# Patient Record
Sex: Female | Born: 1940 | Race: White | Hispanic: No | Smoking: Never smoker
Health system: Southern US, Community
[De-identification: ages and names within clinical notes are randomized; demographics above are authoritative.]

## PROBLEM LIST (undated history)

## (undated) DIAGNOSIS — C4491 Basal cell carcinoma of skin, unspecified: Secondary | ICD-10-CM

## (undated) DIAGNOSIS — K5790 Diverticulosis of intestine, part unspecified, without perforation or abscess without bleeding: Secondary | ICD-10-CM

## (undated) DIAGNOSIS — N3281 Overactive bladder: Secondary | ICD-10-CM

## (undated) DIAGNOSIS — R51 Headache: Secondary | ICD-10-CM

## (undated) DIAGNOSIS — I10 Essential (primary) hypertension: Secondary | ICD-10-CM

## (undated) DIAGNOSIS — A048 Other specified bacterial intestinal infections: Secondary | ICD-10-CM

## (undated) DIAGNOSIS — K589 Irritable bowel syndrome without diarrhea: Secondary | ICD-10-CM

## (undated) DIAGNOSIS — M1990 Unspecified osteoarthritis, unspecified site: Secondary | ICD-10-CM

## (undated) DIAGNOSIS — E785 Hyperlipidemia, unspecified: Secondary | ICD-10-CM

## (undated) DIAGNOSIS — F5101 Primary insomnia: Secondary | ICD-10-CM

## (undated) DIAGNOSIS — R519 Headache, unspecified: Secondary | ICD-10-CM

## (undated) DIAGNOSIS — Z87898 Personal history of other specified conditions: Secondary | ICD-10-CM

## (undated) DIAGNOSIS — K5900 Constipation, unspecified: Secondary | ICD-10-CM

## (undated) DIAGNOSIS — Z8719 Personal history of other diseases of the digestive system: Secondary | ICD-10-CM

## (undated) DIAGNOSIS — M810 Age-related osteoporosis without current pathological fracture: Secondary | ICD-10-CM

## (undated) DIAGNOSIS — C801 Malignant (primary) neoplasm, unspecified: Secondary | ICD-10-CM

## (undated) DIAGNOSIS — H409 Unspecified glaucoma: Secondary | ICD-10-CM

## (undated) DIAGNOSIS — J309 Allergic rhinitis, unspecified: Secondary | ICD-10-CM

## (undated) HISTORY — PX: BLADDER SUSPENSION: SHX5069

## (undated) HISTORY — DX: Malignant (primary) neoplasm, unspecified: C80.1

## (undated) HISTORY — DX: Diverticulosis of intestine, part unspecified, without perforation or abscess without bleeding: K57.90

## (undated) HISTORY — DX: Unspecified osteoarthritis, unspecified site: M19.90

## (undated) HISTORY — DX: Hyperlipidemia, unspecified: E78.5

## (undated) HISTORY — DX: Personal history of other specified conditions: Z87.898

## (undated) HISTORY — DX: Constipation, unspecified: K59.00

## (undated) HISTORY — DX: Personal history of other diseases of the digestive system: Z87.19

## (undated) HISTORY — DX: Primary insomnia: F51.01

## (undated) HISTORY — DX: Unspecified glaucoma: H40.9

## (undated) HISTORY — PX: SURGICAL HX OTHER: 99

## (undated) HISTORY — DX: Age-related osteoporosis without current pathological fracture: M81.0

## (undated) HISTORY — DX: Allergic rhinitis, unspecified: J30.9

## (undated) HISTORY — PX: PR COLONOSCOPY STOMA DX INCLUDING COLLJ SPEC SPX: 44388

## (undated) HISTORY — DX: Essential (primary) hypertension: I10

## (undated) HISTORY — DX: Headache, unspecified: R51.9

## (undated) HISTORY — DX: Other specified bacterial intestinal infections: A04.8

## (undated) HISTORY — DX: Irritable bowel syndrome, unspecified: K58.9

## (undated) HISTORY — DX: Headache: R51

## (undated) HISTORY — DX: Overactive bladder: N32.81

## (undated) HISTORY — DX: Basal cell carcinoma of skin, unspecified: C44.91

## (undated) HISTORY — PX: BLADDER SUSPENSION: SHX72

## (undated) HISTORY — PX: COLONOSCOPY: SHX174

## (undated) MED ORDER — LOSARTAN POTASSIUM 100 MG OR TABS
ORAL_TABLET | ORAL | Status: AC
Start: 2018-12-28 — End: ?

## (undated) MED ORDER — FLUOCINOLONE ACETONIDE 0.01 % OT OIL
TOPICAL_OIL | OTIC | 2 refills | Status: AC
Start: 2022-09-26 — End: ?

## (undated) MED ORDER — FLUOCINOLONE ACETONIDE 0.01 % OT OIL
TOPICAL_OIL | OTIC | Status: AC
Start: 2018-06-05 — End: ?

## (undated) DEATH — deceased

---

## 1995-04-05 HISTORY — PX: BREAST BIOPSY: SHX20

## 1996-04-04 HISTORY — PX: PR TOTAL ABDOMINAL HYSTERECT W/WO RMVL TUBE OVARY: 58150

## 1996-04-04 HISTORY — PX: ABDOMINAL HYSTERECTOMY: SHX81

## 1998-10-26 ENCOUNTER — Ambulatory Visit (HOSPITAL_COMMUNITY): Admission: RE | Admit: 1998-10-26 | Discharge: 1998-10-26 | Payer: Self-pay | Admitting: Internal Medicine

## 1998-10-26 ENCOUNTER — Encounter: Payer: Self-pay | Admitting: Internal Medicine

## 1998-10-28 ENCOUNTER — Other Ambulatory Visit: Admission: RE | Admit: 1998-10-28 | Discharge: 1998-10-28 | Payer: Self-pay | Admitting: *Deleted

## 1999-08-27 ENCOUNTER — Encounter: Payer: Self-pay | Admitting: Internal Medicine

## 1999-08-27 ENCOUNTER — Encounter: Admission: RE | Admit: 1999-08-27 | Discharge: 1999-08-27 | Payer: Self-pay | Admitting: Internal Medicine

## 1999-09-08 ENCOUNTER — Ambulatory Visit (HOSPITAL_COMMUNITY): Admission: RE | Admit: 1999-09-08 | Discharge: 1999-09-08 | Payer: Self-pay | Admitting: Internal Medicine

## 1999-09-08 ENCOUNTER — Encounter: Payer: Self-pay | Admitting: Internal Medicine

## 1999-09-09 ENCOUNTER — Ambulatory Visit (HOSPITAL_COMMUNITY): Admission: RE | Admit: 1999-09-09 | Discharge: 1999-09-09 | Payer: Self-pay | Admitting: Internal Medicine

## 1999-09-09 ENCOUNTER — Encounter: Payer: Self-pay | Admitting: Internal Medicine

## 2000-03-06 ENCOUNTER — Ambulatory Visit (HOSPITAL_COMMUNITY): Admission: RE | Admit: 2000-03-06 | Discharge: 2000-03-06 | Payer: Self-pay | Admitting: Internal Medicine

## 2000-03-06 ENCOUNTER — Encounter: Payer: Self-pay | Admitting: Internal Medicine

## 2000-03-08 ENCOUNTER — Encounter: Admission: RE | Admit: 2000-03-08 | Discharge: 2000-03-08 | Payer: Self-pay | Admitting: Internal Medicine

## 2000-03-08 ENCOUNTER — Encounter: Payer: Self-pay | Admitting: Internal Medicine

## 2001-04-05 ENCOUNTER — Encounter: Payer: Self-pay | Admitting: Internal Medicine

## 2001-04-05 ENCOUNTER — Ambulatory Visit (HOSPITAL_COMMUNITY): Admission: RE | Admit: 2001-04-05 | Discharge: 2001-04-05 | Payer: Self-pay | Admitting: Internal Medicine

## 2002-02-12 ENCOUNTER — Encounter: Payer: Self-pay | Admitting: Internal Medicine

## 2002-02-12 ENCOUNTER — Encounter: Admission: RE | Admit: 2002-02-12 | Discharge: 2002-02-12 | Payer: Self-pay | Admitting: Internal Medicine

## 2002-04-19 ENCOUNTER — Encounter: Admission: RE | Admit: 2002-04-19 | Discharge: 2002-04-19 | Payer: Self-pay | Admitting: Internal Medicine

## 2002-04-19 ENCOUNTER — Encounter: Payer: Self-pay | Admitting: Internal Medicine

## 2002-05-07 ENCOUNTER — Ambulatory Visit (HOSPITAL_COMMUNITY): Admission: RE | Admit: 2002-05-07 | Discharge: 2002-05-07 | Payer: Self-pay | Admitting: Internal Medicine

## 2002-05-07 ENCOUNTER — Encounter: Payer: Self-pay | Admitting: Internal Medicine

## 2002-06-27 ENCOUNTER — Encounter: Payer: Self-pay | Admitting: Internal Medicine

## 2002-06-27 ENCOUNTER — Encounter: Admission: RE | Admit: 2002-06-27 | Discharge: 2002-06-27 | Payer: Self-pay | Admitting: Internal Medicine

## 2002-10-29 ENCOUNTER — Ambulatory Visit (HOSPITAL_COMMUNITY): Admission: RE | Admit: 2002-10-29 | Discharge: 2002-10-29 | Payer: Self-pay | Admitting: Gastroenterology

## 2003-04-25 ENCOUNTER — Encounter: Admission: RE | Admit: 2003-04-25 | Discharge: 2003-04-25 | Payer: Self-pay | Admitting: Internal Medicine

## 2003-05-30 ENCOUNTER — Encounter: Admission: RE | Admit: 2003-05-30 | Discharge: 2003-05-30 | Payer: Self-pay | Admitting: Gastroenterology

## 2003-06-23 ENCOUNTER — Ambulatory Visit (HOSPITAL_COMMUNITY): Admission: RE | Admit: 2003-06-23 | Discharge: 2003-06-23 | Payer: Self-pay | Admitting: Internal Medicine

## 2004-07-16 ENCOUNTER — Ambulatory Visit (HOSPITAL_COMMUNITY): Admission: RE | Admit: 2004-07-16 | Discharge: 2004-07-16 | Payer: Self-pay | Admitting: Internal Medicine

## 2005-04-04 LAB — HM COLONOSCOPY: HM Colonoscopy: NORMAL

## 2005-08-04 ENCOUNTER — Ambulatory Visit (HOSPITAL_COMMUNITY): Admission: RE | Admit: 2005-08-04 | Discharge: 2005-08-04 | Payer: Self-pay | Admitting: Internal Medicine

## 2005-11-14 ENCOUNTER — Other Ambulatory Visit: Admission: RE | Admit: 2005-11-14 | Discharge: 2005-11-14 | Payer: Self-pay | Admitting: Internal Medicine

## 2005-12-13 ENCOUNTER — Encounter: Admission: RE | Admit: 2005-12-13 | Discharge: 2005-12-13 | Payer: Self-pay | Admitting: Internal Medicine

## 2006-02-28 ENCOUNTER — Encounter: Admission: RE | Admit: 2006-02-28 | Discharge: 2006-02-28 | Payer: Self-pay | Admitting: Family Medicine

## 2006-08-11 ENCOUNTER — Ambulatory Visit (HOSPITAL_COMMUNITY): Admission: RE | Admit: 2006-08-11 | Discharge: 2006-08-11 | Payer: Self-pay | Admitting: *Deleted

## 2007-04-09 ENCOUNTER — Ambulatory Visit: Payer: Self-pay | Admitting: Internal Medicine

## 2007-04-09 DIAGNOSIS — R519 Headache, unspecified: Secondary | ICD-10-CM | POA: Insufficient documentation

## 2007-04-09 DIAGNOSIS — K589 Irritable bowel syndrome without diarrhea: Secondary | ICD-10-CM | POA: Insufficient documentation

## 2007-04-09 DIAGNOSIS — R51 Headache: Secondary | ICD-10-CM | POA: Insufficient documentation

## 2007-06-11 ENCOUNTER — Telehealth: Payer: Self-pay | Admitting: Internal Medicine

## 2007-06-12 ENCOUNTER — Ambulatory Visit: Payer: Self-pay | Admitting: Internal Medicine

## 2007-06-12 LAB — CONVERTED CEMR LAB
ALT: 18 units/L (ref 0–35)
AST: 29 units/L (ref 0–37)
Albumin: 4.1 g/dL (ref 3.5–5.2)
Alkaline Phosphatase: 60 units/L (ref 39–117)
Basophils Absolute: 0 10*3/uL (ref 0.0–0.1)
Basophils Relative: 0.9 % (ref 0.0–1.0)
Bilirubin, Direct: 0.2 mg/dL (ref 0.0–0.3)
Blood in Urine, dipstick: NEGATIVE
Cholesterol: 209 mg/dL (ref 0–200)
Eosinophils Relative: 2.8 % (ref 0.0–5.0)
Glucose, Urine, Semiquant: NEGATIVE
Hemoglobin: 14.5 g/dL (ref 12.0–15.0)
Ketones, urine, test strip: NEGATIVE
Lymphocytes Relative: 35.1 % (ref 12.0–46.0)
MCV: 87 fL (ref 78.0–100.0)
Monocytes Absolute: 0.3 10*3/uL (ref 0.2–0.7)
Neutrophils Relative %: 53.8 % (ref 43.0–77.0)
Nitrite: NEGATIVE
RBC: 5.03 M/uL (ref 3.87–5.11)
RDW: 12.2 % (ref 11.5–14.6)
Sodium: 142 meq/L (ref 135–145)
TSH: 0.81 microintl units/mL (ref 0.35–5.50)
Total Protein: 6.4 g/dL (ref 6.0–8.3)
Triglycerides: 73 mg/dL (ref 0–149)
Urobilinogen, UA: 0.2
pH: 6

## 2007-06-15 ENCOUNTER — Ambulatory Visit: Payer: Self-pay | Admitting: Internal Medicine

## 2007-07-24 ENCOUNTER — Telehealth: Payer: Self-pay | Admitting: Internal Medicine

## 2007-07-26 ENCOUNTER — Telehealth: Payer: Self-pay | Admitting: Internal Medicine

## 2007-08-02 ENCOUNTER — Encounter: Payer: Self-pay | Admitting: Internal Medicine

## 2007-08-22 ENCOUNTER — Ambulatory Visit (HOSPITAL_COMMUNITY): Admission: RE | Admit: 2007-08-22 | Discharge: 2007-08-22 | Payer: Self-pay | Admitting: Internal Medicine

## 2007-09-18 ENCOUNTER — Encounter: Payer: Self-pay | Admitting: Internal Medicine

## 2007-09-21 ENCOUNTER — Encounter: Payer: Self-pay | Admitting: Internal Medicine

## 2008-06-19 ENCOUNTER — Encounter: Payer: Self-pay | Admitting: Internal Medicine

## 2008-06-19 LAB — CONVERTED CEMR LAB
Albumin: 4.3 g/dL
BUN: 20 mg/dL
Calcium: 9.2 mg/dL
Cholesterol: 259 mg/dL
Eosinophils Relative: 2 %
Glucose, Bld: 80 mg/dL
HCT: 41.8 %
HDL: 58 mg/dL
LDL Cholesterol: 184 mg/dL
MCV: 85 fL
Neutrophils Relative %: 53 %
Platelets: 203 10*3/uL
Potassium: 4.1 meq/L
RBC: 4.9 M/uL
RDW: 13.4 %
Total Protein: 6.9 g/dL

## 2008-09-09 ENCOUNTER — Encounter: Payer: Self-pay | Admitting: Internal Medicine

## 2008-09-11 ENCOUNTER — Encounter: Payer: Self-pay | Admitting: Internal Medicine

## 2008-11-14 ENCOUNTER — Encounter: Payer: Self-pay | Admitting: Internal Medicine

## 2008-11-14 LAB — CONVERTED CEMR LAB
ALT: 18 units/L
AST: 28 units/L
Alkaline Phosphatase: 88 units/L
HDL: 53 mg/dL
LDL Cholesterol: 85 mg/dL
TSH: 1.23 microintl units/mL

## 2009-07-10 ENCOUNTER — Encounter: Payer: Self-pay | Admitting: Internal Medicine

## 2009-08-05 ENCOUNTER — Encounter: Payer: Self-pay | Admitting: Internal Medicine

## 2009-11-02 ENCOUNTER — Encounter: Payer: Self-pay | Admitting: Internal Medicine

## 2009-11-02 LAB — CONVERTED CEMR LAB: TSH: 0.96 microintl units/mL

## 2009-11-12 ENCOUNTER — Encounter: Admission: RE | Admit: 2009-11-12 | Discharge: 2009-11-12 | Payer: Self-pay | Admitting: *Deleted

## 2009-11-12 ENCOUNTER — Encounter: Payer: Self-pay | Admitting: Internal Medicine

## 2009-11-12 LAB — HM MAMMOGRAPHY

## 2009-11-30 ENCOUNTER — Encounter: Admission: RE | Admit: 2009-11-30 | Discharge: 2009-11-30 | Payer: Self-pay | Admitting: Family Medicine

## 2010-01-21 ENCOUNTER — Ambulatory Visit: Payer: Self-pay | Admitting: Internal Medicine

## 2010-01-21 ENCOUNTER — Encounter (INDEPENDENT_AMBULATORY_CARE_PROVIDER_SITE_OTHER): Payer: Self-pay | Admitting: *Deleted

## 2010-01-21 ENCOUNTER — Telehealth: Payer: Self-pay | Admitting: Internal Medicine

## 2010-01-21 DIAGNOSIS — H8110 Benign paroxysmal vertigo, unspecified ear: Secondary | ICD-10-CM | POA: Insufficient documentation

## 2010-01-21 DIAGNOSIS — M129 Arthropathy, unspecified: Secondary | ICD-10-CM | POA: Insufficient documentation

## 2010-01-21 DIAGNOSIS — I10 Essential (primary) hypertension: Secondary | ICD-10-CM | POA: Insufficient documentation

## 2010-01-21 DIAGNOSIS — R198 Other specified symptoms and signs involving the digestive system and abdomen: Secondary | ICD-10-CM | POA: Insufficient documentation

## 2010-01-21 DIAGNOSIS — E785 Hyperlipidemia, unspecified: Secondary | ICD-10-CM | POA: Insufficient documentation

## 2010-01-21 DIAGNOSIS — Z9189 Other specified personal risk factors, not elsewhere classified: Secondary | ICD-10-CM | POA: Insufficient documentation

## 2010-01-21 LAB — CONVERTED CEMR LAB
ALT: 20 units/L (ref 0–35)
AST: 27 units/L (ref 0–37)
Albumin: 4 g/dL (ref 3.5–5.2)
Alkaline Phosphatase: 85 units/L (ref 39–117)
Basophils Relative: 0.8 % (ref 0.0–3.0)
Bilirubin, Direct: 0 mg/dL (ref 0.0–0.3)
Eosinophils Absolute: 0.1 10*3/uL (ref 0.0–0.7)
Eosinophils Relative: 1.8 % (ref 0.0–5.0)
HCT: 41.3 % (ref 36.0–46.0)
Hemoglobin: 14.4 g/dL (ref 12.0–15.0)
MCV: 86.4 fL (ref 78.0–100.0)
Monocytes Absolute: 0.3 10*3/uL (ref 0.1–1.0)
Neutro Abs: 2.2 10*3/uL (ref 1.4–7.7)
Neutrophils Relative %: 54.7 % (ref 43.0–77.0)
RBC: 4.79 M/uL (ref 3.87–5.11)
Total Bilirubin: 0.6 mg/dL (ref 0.3–1.2)
WBC: 4 10*3/uL — ABNORMAL LOW (ref 4.5–10.5)

## 2010-01-22 ENCOUNTER — Encounter: Payer: Self-pay | Admitting: Internal Medicine

## 2010-01-26 ENCOUNTER — Encounter: Admission: RE | Admit: 2010-01-26 | Discharge: 2010-01-26 | Payer: Self-pay | Admitting: Internal Medicine

## 2010-01-27 ENCOUNTER — Encounter: Payer: Self-pay | Admitting: Internal Medicine

## 2010-02-01 ENCOUNTER — Telehealth: Payer: Self-pay | Admitting: Internal Medicine

## 2010-02-01 ENCOUNTER — Encounter: Payer: Self-pay | Admitting: Internal Medicine

## 2010-02-01 ENCOUNTER — Encounter: Admission: RE | Admit: 2010-02-01 | Discharge: 2010-02-15 | Payer: Self-pay | Admitting: Internal Medicine

## 2010-02-02 ENCOUNTER — Telehealth (INDEPENDENT_AMBULATORY_CARE_PROVIDER_SITE_OTHER): Payer: Self-pay | Admitting: *Deleted

## 2010-02-03 ENCOUNTER — Encounter: Payer: Self-pay | Admitting: Internal Medicine

## 2010-02-03 DIAGNOSIS — E559 Vitamin D deficiency, unspecified: Secondary | ICD-10-CM | POA: Insufficient documentation

## 2010-02-09 ENCOUNTER — Ambulatory Visit: Payer: Self-pay | Admitting: Internal Medicine

## 2010-02-09 LAB — CONVERTED CEMR LAB
Direct LDL: 168.9 mg/dL
HDL: 50 mg/dL (ref >39.00)
Total CHOL/HDL Ratio: 5
Triglycerides: 133 mg/dL (ref 0.0–149.0)
VLDL: 26.6 mg/dL (ref 0.0–40.0)

## 2010-02-10 ENCOUNTER — Encounter: Payer: Self-pay | Admitting: Internal Medicine

## 2010-02-19 ENCOUNTER — Ambulatory Visit: Payer: Self-pay | Admitting: Internal Medicine

## 2010-04-02 ENCOUNTER — Encounter: Payer: Self-pay | Admitting: Internal Medicine

## 2010-04-25 ENCOUNTER — Encounter: Payer: Self-pay | Admitting: Internal Medicine

## 2010-05-02 LAB — CONVERTED CEMR LAB
ALT: 24 U/L
AST: 33 U/L
Albumin: 4.4 g/dL
Alkaline Phosphatase: 95 U/L
BUN: 18 mg/dL
Basophils Relative: 1 %
CO2: 26 meq/L
Calcium: 9.1 mg/dL
Chloride: 104 meq/L
Cholesterol: 171 mg/dL
Creatinine, Ser: 0.65 mg/dL
Eosinophils Relative: 3 %
Glucose, Bld: 82 mg/dL
HCT: 42.6 %
HDL: 60 mg/dL
Hemoglobin: 14.7 g/dL
LDL Cholesterol: 96 mg/dL
Lymphocytes, automated: 43 %
MCV: 88 fL
Monocytes Relative: 8 %
Neutrophils Relative %: 45 %
Platelets: 240 10*3/uL
Potassium: 4.1 meq/L
RBC: 4.87 M/uL
RDW: 14 %
Sodium: 141 meq/L
Total Bilirubin: 0.5 mg/dL
Total Protein: 6.5 g/dL
Triglyceride fasting, serum: 77 mg/dL
WBC: 4.9 10*3/uL

## 2010-05-04 NOTE — Miscellaneous (Signed)
Summary: Patient Discharged / [facility]   Patient Discharged / [facility]   Imported By: Lennie Odor 02/22/2010 14:22:07  _____________________________________________________________________  External Attachment:    Type:   Image     Comment:   External Document

## 2010-05-04 NOTE — Progress Notes (Signed)
Summary: REFERRAL  Phone Note Call from Patient Call back at [phone]   Summary of Call: Patient is requesting referral to PT for vestibular rehab. I see this was already ordered, left vm for patient that MD has ordered requested referral.  Initial call taken by: Lamar Sprinkles, CMA,  January 21, 2010 5:43 PM

## 2010-05-04 NOTE — Progress Notes (Signed)
Summary: Lab req  Phone Note Call from Patient Call back at Home Phone [phone]   Caller: Patient Summary of Call: Pt called requesting to have Lipids done prior to OV 11/18. Okay to order 272.4? Initial call taken by: Margaret Pyle, CMA,  February 01, 2010 2:10 PM  Follow-up for Phone Call        ok, yes - Follow-up by: Newt Lukes MD,  February 02, 2010 8:11 AM  Additional Follow-up for Phone Call Additional follow up Details #1::        Labs entered into IDX, pt notified. Additional Follow-up by: Margaret Pyle, CMA,  February 02, 2010 8:45 AM

## 2010-05-04 NOTE — Assessment & Plan Note (Signed)
Summary: NEW MEDICARE PT--PKG--#-FORMER DR K PT--[facility]C   Vital Signs:  Patient profile:   70 year old female Height:      61 inches (154.94 cm) Weight:      122.8 pounds (55.82 kg) BMI:     23.29 O2 Sat:      97 % on Room air Temp:     97.9 degrees F (36.61 degrees C) oral Pulse rate:   72 / minute BP sitting:   132 / 68  (left arm) Cuff size:   regular  Vitals Entered By: Orlan Leavens RMA (January 21, 2010 9:33 AM)  O2 Flow:  Room air CC: New patient Is Patient Diabetic? No Pain Assessment Patient in pain? no      Comments want flu shot. Will need letter stating she had flu inj   Primary Care Provider:  Newt Lukes MD  CC:  New patient.  History of Present Illness: new pt to me but known to our practice - seen 2009 by by partner Dr. Kirtland Bouchard here to est care  c/o BPPV -dizzy symptoms - onset 6 months ago while living in [place] - dx by provider there with same -  symptoms progressive in past few weeks symptoms present for last few years to lesser extent symptoms unimproved with self manuevers wishes to not take oral meds if able to avoid no HA, no hearing change or ringing - no vision change feels "sloshing" in left ear but no discharge or pain also a/w nausea but no falls  IBS, constipation predominate - long hx same - worse since retirement 10/2009 - not improved with attendtion to diet and use of probiotics episode flares 50% time, prev only 2-3x/week a/w bloating and nausea but no vomitting -  +lower abd discomfort but not painful overwhelming fatigue during flare  glaucoma - reports compliance with ongoing medical treatment and no changes in medication dose or frequency. denies adverse side effects related to current therapy. no eye pain or vision changes - follows with optho  dyslipidemia -prev on rx med tx for same - lipitor - self dc'd same 10/2009 due to muscle fatigue which has improved off med - - follows low fat diet and regular exercise  OA - follows with  ortho at [facility] for same  Preventive Screening-Counseling & Management  Alcohol-Tobacco     Alcohol drinks/day: <1     Alcohol Counseling: not indicated; use of alcohol is not excessive or problematic     Smoking Status: never     Tobacco Counseling: not indicated; no tobacco use  Caffeine-Diet-Exercise     Diet Counseling: not indicated; diet is assessed to be healthy     Does Patient Exercise: yes     Exercise Counseling: not indicated; exercise is adequate     Depression Counseling: not indicated; screening negative for depression  Safety-Violence-Falls     Seat Belt Counseling: not indicated; patient wears seat belts     Helmet Counseling: not indicated; patient wears helmet when riding bicycle/motocycle     Firearm Counseling: not applicable     Violence Counseling: not indicated; no violence risk noted     Fall Risk Counseling: not indicated; no significant falls noted  Clinical Review Panels:  Prevention   Last Mammogram:  [facility].   No specific mammographic evidence of malignancy.  Assessment: BIRADS 1. (11/12/2009)   Last Pap Smear:  Interpretation /Result:Negative for intraepithelial Lesion or Malignancy.    (04/04/2006)   Last Colonoscopy:  Pt  states had colonoscopy done back in 08-26-1996, not sure exactly where, but everything was normal (04/04/1996)  Immunizations   Last Flu Vaccine:  Fluvax 3+ (01/21/2010)  Lipid Management   Cholesterol:  209 (06/12/2007)   LDL (bad choesterol):  DEL (06/12/2007)   HDL (good cholesterol):  48.3 (06/12/2007)  CBC   WBC:  4.0 (06/12/2007)   RBC:  5.03 (06/12/2007)   Hgb:  14.5 (06/12/2007)   Hct:  43.8 (06/12/2007)   Platelets:  232 (06/12/2007)   MCV  87.0 (06/12/2007)   MCHC  33.0 (06/12/2007)   RDW  12.2 (06/12/2007)   PMN:  53.8 (06/12/2007)   Lymphs:  35.1 (06/12/2007)   Monos:  7.4 (06/12/2007)   Eosinophils:  2.8 (06/12/2007)   Basophil:  0.9 (06/12/2007)  Complete Metabolic  Panel   Glucose:  84 (06/12/2007)   Sodium:  142 (06/12/2007)   Potassium:  3.9 (06/12/2007)   Chloride:  105 (06/12/2007)   CO2:  31 (06/12/2007)   BUN:  16 (06/12/2007)   Creatinine:  0.7 (06/12/2007)   Albumin:  4.1 (06/12/2007)   Total Protein:  6.4 (06/12/2007)   Calcium:  9.3 (06/12/2007)   Total Bili:  0.9 (06/12/2007)   Alk Phos:  60 (06/12/2007)   SGPT (ALT):  18 (06/12/2007)   SGOT (AST):  29 (06/12/2007)   Current Medications (verified): 1)  Latanoprost 0.005 % Soln (Latanoprost) .... Use 1 Drop 3 Times A Week  Allergies (verified): No Known Drug Allergies  Past History:  Past Medical History: glaucoma constipation prone IBS history H. pylori treated 08-27-2006 Headache Hyperlipidemia Hypertension Urinary incontinence arthritis  MD roster: Darlen Round - macdiarmind ortho - rebecca bassett [facility])  Past Surgical History: bladder suspension Aug 26, 1984, 27-Aug-1995 Hysterectomy 1996-08-26 breast biopsy in 27-Aug-1995 colonscopy Aug 26, 2005  Family History: father died late 39s, senile dementia mother died age 53, throat cancer   One brother died at 67, probable cardiac death one sister.  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 I have reviewed all other systems and they were negative.   Physical Exam  General:  alert, well-developed, well-nourished, and cooperative to examination.    Head:  Normocephalic and atraumatic without obvious abnormalities. No apparent alopecia or balding. Eyes:  vision grossly intact; pupils equal, round and reactive to light.  conjunctiva and lids normal.   no nystagmus Ears:  normal pinnae bilaterally, without erythema, swelling, or  tenderness to palpation. TMs clear, without effusion, or cerumen impaction. Hearing grossly normal bilaterally  Mouth:  teeth and gums in good repair; mucous membranes moist, without lesions or ulcers. oropharynx clear without exudate, erythema.  Neck:  supple, full ROM, no masses, no thyromegaly; no thyroid nodules or tenderness. no JVD or carotid bruits.   Lungs:  normal respiratory effort, no intercostal retractions or use of accessory muscles; normal breath sounds bilaterally - no crackles and no wheezes.    Heart:  normal rate, regular rhythm, no murmur, and no rub. BLE without edema. normal DP pulses and normal cap refill in all 4 extremities    Abdomen:  soft, non-tender, normal bowel sounds, no distention; no masses and no appreciable hepatomegaly or splenomegaly.   Neurologic:  alert & oriented X3 and cranial nerves II-XII symetrically intact.  strength normal in all extremities, sensation intact to light touch, and gait normal. speech fluent without dysarthria or aphasia; follows commands with good comprehension.  Psych:  Oriented X3, memory intact for recent and remote, normally interactive, good eye contact, not anxious appearing, not depressed appearing, and not agitated.      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)  has not improved with self tx HallPike - wishes to avoid meds for tx same neuro exam benign today refer for formal vestibular rehab and consider need for ENT eval if not improved -  Orders: Physical Therapy Referral (PT)  Problem # 2:  NAUSEA (ICD-787.02) progressive, near chronic symptoms -  exam and history benign - no weight loss, fever or vomitting ?a/w constipation IBS or other -  check LFTs and CBC now + celiac ab - review other recent labs at [place] and [place] providers as able - ROI signed also refer for abd [place] to eval anatomy and r/o GB dz Orders: T-Sprue Panel (Celiac Disease Aby Eval)  (83516x3/[address]) TLB-CBC Platelet - w/Differential (85025-CBCD) TLB-Hepatic/Liver Function Pnl (80076-HEPATIC) Radiology Referral (Radiology)  Problem # 3:  IBS (ICD-564.1) constipation predominate - eval as above for nausea - labs, [place] review prior med records from other providers including TSH, colo  trial probiotic x 4 weeks and use gas x for bloating - consider amitiza but pt wishes to avoid meds if possible Orders: T-Sprue Panel (Celiac Disease Aby Eval) (83516x3/[address]) TLB-CBC Platelet - w/Differential (85025-CBCD) TLB-Hepatic/Liver Function Pnl (80076-HEPATIC) Radiology Referral (Radiology)  Problem # 4:  HYPERLIPIDEMIA (ICD-272.4)  prev on lipitor - stopped 10/2009 due to muscle fatigue - symptoms improved off statin recheck next OV and consider alt tx trial as needed   Labs Reviewed: SGOT: 29 (06/12/2007)   SGPT: 18 (06/12/2007)   HDL:48.3 (06/12/2007)  LDL:DEL (06/12/2007)  Chol:209 (06/12/2007)  Trig:73 (06/12/2007)  Time spent with patient 45 minutes, more than 50% of this time was spent counseling patient on BPPV options for tx, eval and plans for IBS and nausea/constipation + review of chronic med conditions and prior tx  Complete Medication List: 1)  Latanoprost 0.005 % Soln (Latanoprost) .... Use 1 drop 3 times a week 2)  Align Caps (Probiotic product) .Marland Kitchen.. 1 by mouth once daily  Other Orders: Flu Vaccine 62yrs + MEDICARE PATIENTS (W1191) Administration Flu vaccine - MCR (Y7829)  Patient Instructions: 1)  it was good to see you today. 2)  test(s) ordered today - your results will be posted on the phone tree for review in 48-72 hours from the time of test completion; call [phone] and enter your 9 digit MRN (listed above on this page, just below your name); if any changes need to be made or there are abnormal results, you will be contacted directly. 3)  we'll make referral for abdominal ultrasound. Our office will contact you regarding this appointment  once made.  4)  try Align once daily for IBS symptoms - samples given - also simethicone (gas x) for bloating symptoms  5)  plan refer to vestibular rehab - let us know if [address] can do this - if not, will refer to [address] 6)  will send for records from your providers at [facility] and d.c. to review 7)  Please schedule a follow-up appointment in 6 weeks to continue review, call sooner if problems.    Orders Added: 1)  Flu Vaccine 106yrs +  MEDICARE PATIENTS [Q2039] 2)  Administration Flu vaccine - MCR [G0008] 3)  T-Sprue Panel (Celiac Disease Aby Eval) [83516x3/[address]] 4)  TLB-CBC Platelet - w/Differential [85025-CBCD] 5)  TLB-Hepatic/Liver Function Pnl [80076-HEPATIC] 6)  Radiology Referral [Radiology] 7)  Est. Patient Level V [09811] 8)  Physical Therapy Referral [PT]     Pap Smear  Procedure date:  04/04/2006  Findings:      Interpretation /Result:Negative for intraepithelial Lesion or Malignancy.     Mammogram  Procedure date:  11/12/2009  Findings:      Location: [facility].   No specific mammographic evidence of malignancy.  Assessment: BIRADS 1.   Comments:      Screening mammogram in 1 year.     Bone Density  Procedure date:  12/13/2005  Findings:      Location:  [facility].    Lumbar spine (L1-L4) BMD, 0.749 T-score, -2.7 Z-score, -0.2  Left hip (Neck) BMD,  0.659 T-score, -1.7 Z-score,  -0.2  Colonoscopy  Procedure date:  04/04/1996  Findings:      Pt states had colonoscopy done back in 1998, not sure exactly where, but everything was normal Flu Vaccine Consent Questions     Do you have a history of severe allergic reactions to this vaccine? no    Any prior history of allergic reactions to egg and/or gelatin? no    Do you have a sensitivity to the preservative Thimersol? no    Do you have a past history of Guillan-Barre Syndrome? no    Do you currently have an acute febrile illness? no     Have you ever had a severe reaction to latex? no    Vaccine information given and explained to patient? yes    Are you currently pregnant? no    Lot Number:[id number]   Exp Date:10/02/2010   Site Given  Left Deltoid IM T-score, -1.7 Z-score,  -0.2  Colonoscopy  Procedure date:  04/04/1996  Findings:      Pt states had colonoscopy done back in 1998, not sure exactly where, but everything was normal  .lbmedflu

## 2010-05-04 NOTE — Assessment & Plan Note (Signed)
Summary: PER PT FU  D/T---[facility]   Vital Signs:  Patient profile:   70 year old female Height:      61 inches (154.94 cm) Weight:      123.4 pounds (56.09 kg) O2 Sat:      97 % on Room air Temp:     98.0 degrees F (36.67 degrees C) oral Pulse rate:   63 / minute BP sitting:   134 / 60  (left arm) Cuff size:   regular  Vitals Entered By: Orlan Leavens RMA (February 19, 2010 8:35 AM)  O2 Flow:  Room air CC: follow-up visit Is Patient Diabetic? No Pain Assessment Patient in pain? no        Primary Care Sola Margolis:  Newt Lukes MD  CC:  follow-up visit.  History of Present Illness: new pt to me but known to our practice - seen 2009 by by partner Dr. Kirtland Bouchard here to est care  c/o BPPV -dizzy symptoms - onset summer 2011 while living in [place] - dx by Viyan Rosamond there with same -  symptoms progressive but present for last few years to lesser extent symptoms unimproved with self manuevers no HA, no hearing change or ringing - no vision change feels "sloshing" in left ear but no discharge or pain also a/w nausea but no falls s/p OP vestib rehab eval -   IBS, constipation predominate - long hx same - worse since retirement 10/2009 - not improved with attendtion to diet and use of probiotics episode flares 50% time, prev only 2-3x/week a/w bloating and nausea but no vomitting -  +lower abd discomfort but not painful overwhelming fatigue during flare improved with probiotic and gas x  glaucoma - reports compliance with ongoing medical treatment and no changes in medication dose or frequency. denies adverse side effects related to current therapy. no eye pain or vision changes - follows with optho  dyslipidemia -prev on rx med tx for same - lipitor - self dc'd same 10/2009 due to muscle fatigue which has improved off med - - follows low fat diet and regular exercise  OA - follows with ortho at [facility] for same  Preventive Screening-Counseling & Management  Alcohol-Tobacco     Alcohol  drinks/day: <1     Alcohol Counseling: not indicated; use of alcohol is not excessive or problematic     Smoking Status: never     Tobacco Counseling: not indicated; no tobacco use  Clinical Review Panels:  Prevention   Last Mammogram:  [facility].   No specific mammographic evidence of malignancy.  Assessment: BIRADS 1. (11/12/2009)   Last Pap Smear:  Interpretation /Result:Negative for intraepithelial Lesion or Malignancy.    (04/04/2006)   Last Colonoscopy:  Pt states had colonoscopy done back in 1998, not sure exactly where, but everything was normal (04/04/1996)  CBC   WBC:  4.0 (01/21/2010)   RBC:  4.79 (01/21/2010)   Hgb:  14.4 (01/21/2010)   Hct:  41.3 (01/21/2010)   Platelets:  237.0 (01/21/2010)   MCV  86.4 (01/21/2010)   MCHC  34.8 (01/21/2010)   RDW  13.0 (01/21/2010)   PMN:  54.7 (01/21/2010)   Lymphs:  35.1 (01/21/2010)   Monos:  7.6 (01/21/2010)   Eosinophils:  1.8 (01/21/2010)   Basophil:  0.8 (01/21/2010)  Complete Metabolic Panel   Glucose:  82 (08/05/2009)   Sodium:  141 (08/05/2009)   Potassium:  4.1 (08/05/2009)   Chloride:  104 (08/05/2009)   CO2:  26 (08/05/2009)  BUN:  18 (08/05/2009)   Creatinine:  0.65 (08/05/2009)   Albumin:  4.0 (01/21/2010)   Total Protein:  6.3 (01/21/2010)   Calcium:  9.1 (08/05/2009)   Total Bili:  0.6 (01/21/2010)   Alk Phos:  85 (01/21/2010)   SGPT (ALT):  20 (01/21/2010)   SGOT (AST):  27 (01/21/2010)   Current Medications (verified): 1)  Latanoprost 0.005 % Soln (Latanoprost) .... Use 1 Drop 3 Times A Week 2)  Align  Caps (Probiotic Product) .Marland Kitchen.. 1 By Mouth Once Daily  Allergies (verified): No Known Drug Allergies  Past History:  Past Medical History: glaucoma constipation prone IBS history H. pylori treated 2008 Headache Hyperlipidemia Hypertension Urinary incontinence/OAB arthritis  MD roster: uro - macdiarmind  ortho - rebecca bassett [facility])  Review of  Systems  The patient denies fever, chest pain, and syncope.    Physical Exam  General:  alert, well-developed, well-nourished, and cooperative to examination.    Eyes:  vision grossly intact; pupils equal, round and reactive to light.  conjunctiva and lids normal.   no nystagmus Lungs:  normal respiratory effort, no intercostal retractions or use of accessory muscles; normal breath sounds bilaterally - no crackles and no wheezes.    Heart:  normal rate, regular rhythm, no murmur, and no rub. BLE without edema.   Impression & Recommendations:  Problem # 1:  HYPERLIPIDEMIA (ICD-272.4)  upward trend in total and LDL since off Lipitor 10/2009 -  declines any other statin due to adv effects on lipitor (myalgia) pt opts to work on weight/exercise recheck 6 mo  Labs Reviewed: SGOT: 27 (01/21/2010)   SGPT: 20 (01/21/2010)   HDL:50.00 (02/09/2010), 60 (08/05/2009)  LDL:96 (08/05/2009), 85 (11/14/2008)  Chol:234 (02/09/2010), 171 (08/05/2009)  Trig:133.0 (02/09/2010), 77 (08/05/2009)  Problem # 2:  IBS (ICD-564.1)  constipation predominate - prior eval as above for nausea - labs, US unremarkable review prior med records from other providers including TSH, colo  improved with probiotic and use gas x for bloating - consider amitiza but pt wishes to avoid meds if possible  Problem # 3:  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 Use 1 drop 3 times a week 2)  Align Caps (Probiotic product) .Marland Kitchen.. 1 by mouth once daily  Patient Instructions: 1)  it was good to see you today. 2)  labs reviewed - work on diet and exercise for cholesterol - will  recheck in 6 months -  3)  we'll make referral to ENT. Our office will contact you regarding this appointment once made.  4)  let us know when you are ready to resume vestibular rehab in 2012 and we will make referral to kristin 5)  Please schedule a follow-up appointment in 6 months for cholesterol recheck, sooner if problems.    Orders Added: 1)  Est. Patient Level IV [16109] 2)  ENT Referral [ENT]

## 2010-05-04 NOTE — Letter (Signed)
Summary: [facility]  [address] [place], [place] [address]   Phone: [phone]  Fax: [phone]    Today's Date: January 21, 2010  Name of Patient: Joan Welch  The above named patient had a medical visit today 01/21/10, and she recieved the Influenza Vaccine. (L) deltoid, lot # [id number], exp 10/02/10     Sincerely yours,   Dr. Rene Paci

## 2010-05-04 NOTE — Progress Notes (Signed)
  Phone Note Other Incoming   Request: Send information Summary of Call: Records received from Dr. Viona Gilmore. 33 pages forwarded to Dr. [name] for review.

## 2010-05-04 NOTE — Letter (Signed)
Summary: Artist Pais MD/[facility]  Artist Pais MD/[facility]   Imported By: Lester [place] 02/18/2010 10:48:30  _____________________________________________________________________  External Attachment:    Type:   Image     Comment:   External Document

## 2010-05-04 NOTE — Miscellaneous (Signed)
Summary: PT Eval/[facility]  PT Eval/[facility]   Imported By: Sherian Rein 02/10/2010 09:51:34  _____________________________________________________________________  External Attachment:    Type:   Image     Comment:   External Document

## 2010-05-04 NOTE — Consult Note (Signed)
Summary: [facility]   Imported By: Sherian Rein 02/09/2010 14:51:47  _____________________________________________________________________  External Attachment:    Type:   Image     Comment:   External Document

## 2010-05-04 NOTE — Letter (Signed)
Summary: Joan Welch at [facility]   Imported By: Lester [place] 02/18/2010 10:40:41  _____________________________________________________________________  External Attachment:    Type:   Image     Comment:   External Document

## 2010-05-06 NOTE — Consult Note (Signed)
Summary: [facility]  [facility]   Imported By: Lennie Odor 04/12/2010 09:39:41  _____________________________________________________________________  External Attachment:    Type:   Image     Comment:   External Document

## 2010-05-11 ENCOUNTER — Encounter: Payer: Self-pay | Admitting: Internal Medicine

## 2010-05-11 ENCOUNTER — Ambulatory Visit (INDEPENDENT_AMBULATORY_CARE_PROVIDER_SITE_OTHER): Payer: Medicare Other | Admitting: Internal Medicine

## 2010-05-11 DIAGNOSIS — I10 Essential (primary) hypertension: Secondary | ICD-10-CM

## 2010-05-18 ENCOUNTER — Telehealth: Payer: Self-pay | Admitting: Internal Medicine

## 2010-05-20 NOTE — Assessment & Plan Note (Signed)
Summary: discuss blood pressure/cd   Vital Signs:  Patient profile:   70 year old female Height:      61 inches (154.94 cm) Weight:      123.8 pounds (56.27 kg) O2 Sat:      97 % on Room air Temp:     98.1 degrees F (36.72 degrees C) oral Pulse rate:   62 / minute BP sitting:   152 / 72  (left arm) Cuff size:   regular  Vitals Entered By: Orlan Leavens RMA (May 11, 2010 2:26 PM)  O2 Flow:  Room air CC: Discuss BP Is Patient Diabetic? No Pain Assessment Patient in pain? no        Primary Care Provider:  Newt Lukes MD  CC:  Discuss BP.  History of Present Illness: concern about BP - SBP 130-150s in past 4-6 weeks (home BP reviewed) working on Na reduction and changed to aerobic activity  also reviewed chronic issues:  chronic  BPPV -dizzy symptoms - onset summer 2011 while living in [place] - dx by provider there with same -  symptoms progressive but present for last few years to lesser extent symptoms unimproved with self manuevers no HA, no hearing change or ringing - no vision change feels "sloshing" in left ear but no discharge or pain also a/w nausea but no falls s/p OP vestib rehab eval and ENT eval 04/2010  IBS, constipation predominate - long hx same - worse since retirement 10/2009 - not improved with attendtion to diet and use of probiotics episode flares 50% time, prev only 2-3x/week a/w bloating and nausea but no vomitting -  +lower abd discomfort but not painful overwhelming fatigue during flare improved with probiotic and gas x  glaucoma - reports compliance with ongoing medical treatment and no changes in medication dose or frequency. denies adverse side effects related to current therapy. no eye pain or vision changes - follows with optho  dyslipidemia -prev on rx med tx for same - lipitor - self dc'd same 10/2009 due to muscle fatigue which has improved off med - - follows low fat diet and regular exercise  OA - follows with ortho at [facility] for  same  Clinical Review Panels:  Lipid Management   Cholesterol:  234 (02/09/2010)   LDL (bad choesterol):  96 (08/05/2009)   HDL (good cholesterol):  50.00 (02/09/2010)   Triglycerides:  77 (08/05/2009)  CBC   WBC:  4.0 (01/21/2010)   RBC:  4.79 (01/21/2010)   Hgb:  14.4 (01/21/2010)   Hct:  41.3 (01/21/2010)   Platelets:  237.0 (01/21/2010)   MCV  86.4 (01/21/2010)   MCHC  34.8 (01/21/2010)   RDW  13.0 (01/21/2010)   PMN:  54.7 (01/21/2010)   Lymphs:  35.1 (01/21/2010)   Monos:  7.6 (01/21/2010)   Eosinophils:  1.8 (01/21/2010)   Basophil:  0.8 (01/21/2010)  Complete Metabolic Panel   Glucose:  82 (08/05/2009)   Sodium:  141 (08/05/2009)   Potassium:  4.1 (08/05/2009)   Chloride:  104 (08/05/2009)   CO2:  26 (08/05/2009)   BUN:  18 (08/05/2009)   Creatinine:  0.65 (08/05/2009)   Albumin:  4.0 (01/21/2010)   Total Protein:  6.3 (01/21/2010)   Calcium:  9.1 (08/05/2009)   Total Bili:  0.6 (01/21/2010)   Alk Phos:  85 (01/21/2010)   SGPT (ALT):  20 (01/21/2010)   SGOT (AST):  27 (01/21/2010)   Current Medications (verified): 1)  Latanoprost 0.005 % Soln (Latanoprost) .... Use  1 Drop 3 Times A Week 2)  Align  Caps (Probiotic Product) .Marland Kitchen.. 1 By Mouth Once Daily 3)  Enablex 7.5 Mg Xr24h-Tab (Darifenacin Hydrobromide) .... Take 1 Three Times A Week  Allergies (verified): No Known Drug Allergies  Past History:  Past Medical History: glaucoma constipation prone IBS history H. pylori treated 2008 Headache Hyperlipidemia Hypertension Urinary incontinence/OAB arthritis  MD roster: uro - mcdiarmond  ortho - rebecca bassett [facility])   Review of Systems  The patient denies weight loss, chest pain, syncope, and headaches.    Physical Exam  General:  alert, well-developed, well-nourished, and cooperative to examination.    Lungs:  normal respiratory effort, no intercostal retractions or use of accessory muscles; normal breath sounds bilaterally - no crackles and  no wheezes.    Heart:  normal rate, regular rhythm, no murmur, and no rub. BLE without edema.   Impression & Recommendations:  Problem # 1:  HYPERTENSION (ICD-401.9)  never on rx med for same - encouraged cont lifestyle changes as initiated and start low dose ARB - same explained to pt who understands and agrees reck 6 weeks, sooner if problems  Her updated medication list for this problem includes:    Losartan Potassium 25 Mg Tabs (Losartan potassium) .Marland Kitchen... 1 by mouth once daily  BP today: 152/72 Prior BP: 134/60 (02/19/2010)  Labs Reviewed: K+: 4.1 (08/05/2009) Creat: : 0.65 (08/05/2009)   Chol: 234 (02/09/2010)   HDL: 50.00 (02/09/2010)   LDL: 96 (08/05/2009)   TG: 133.0 (02/09/2010)  Orders: Prescription Created Electronically [phone])  Complete Medication List: 1)  Latanoprost 0.005 % Soln (Latanoprost) .... Use 1 drop 3 times a week 2)  Align Caps (Probiotic product) .Marland Kitchen.. 1 by mouth once daily 3)  Enablex 7.5 Mg Xr24h-tab (Darifenacin hydrobromide) .... Take 1 three times a week 4)  Losartan Potassium 25 Mg Tabs (Losartan potassium) .Marland Kitchen.. 1 by mouth once daily  Patient Instructions: 1)  it was good to see you today. 2)  home BP log reviewed today - continue aerobic exercise and sodium restriction for lifestyle changes as you have begun 3)  start low dose losartan (ARB) to help with BP control - your prescription has been electronically submitted to your pharmacy. Please take as directed. Contact our office if you believe you're having problems with the medication(s).  4)  Please schedule a follow-up appointment in 6 weeks to recheck BP and meds, call sooner if problems.  Prescriptions: LOSARTAN POTASSIUM 25 MG TABS (LOSARTAN POTASSIUM) 1 by mouth once daily  #30 x 3   Entered and Authorized by:   Newt Lukes MD   Signed by:   Newt Lukes MD on 05/11/2010   Method used:   Electronically to        [facility] [phone]* (retail)       [address]       [place], [place]  [address]       Ph: [phone] or [phone]       Fax: [phone]   RxID:   [id number]    Orders Added: 1)  Est. Patient Level IV [95638] 2)  Prescription Created Electronically [phone]

## 2010-05-20 NOTE — Progress Notes (Signed)
Summary: Blood Pressure Log from patient  Blood Pressure Log from patient   Imported By: Lennie Odor 05/14/2010 17:18:47  _____________________________________________________________________  External Attachment:    Type:   Image     Comment:   External Document

## 2010-05-26 NOTE — Progress Notes (Signed)
Summary: RESULTS - dexa and mammo  Phone Note Call from Patient   Summary of Call: Patient is requesting a call back after MD recieves dexa and mamogram.  Initial call taken by: Lamar Sprinkles, CMA,  May 18, 2010 1:41 PM  Follow-up for Phone Call        neither are in yet - will watch for them - thanks Follow-up by: Newt Lukes MD,  May 18, 2010 2:17 PM  Additional Follow-up for Phone Call Additional follow up Details #1::        dexa shows osteoporosis but no change from 2007 - will not change med tx at this time - cont Calcium + vit D otc meds as listed on EMR - still no mammo report but [facility] will contact pt if there is any abn on mammo - thanks Additional Follow-up by: Newt Lukes MD,  May 19, 2010 6:03 PM    Additional Follow-up for Phone Call Additional follow up Details #2::    Notified pt with md reponse Follow-up by: Orlan Leavens RMA,  May 20, 2010 10:05 AM  New/Updated Medications: CITRACAL/VITAMIN D 250-200 MG-UNIT TABS (CALCIUM CITRATE-VITAMIN D) 2 by mouth two times a day VITAMIN D 1000 UNIT TABS (CHOLECALCIFEROL) 1 by mouth once daily

## 2010-06-22 ENCOUNTER — Encounter: Payer: Self-pay | Admitting: Internal Medicine

## 2010-06-23 ENCOUNTER — Encounter: Payer: Self-pay | Admitting: Internal Medicine

## 2010-06-23 ENCOUNTER — Ambulatory Visit (INDEPENDENT_AMBULATORY_CARE_PROVIDER_SITE_OTHER): Payer: Medicare Other | Admitting: Internal Medicine

## 2010-06-23 DIAGNOSIS — I10 Essential (primary) hypertension: Secondary | ICD-10-CM

## 2010-06-23 DIAGNOSIS — M129 Arthropathy, unspecified: Secondary | ICD-10-CM

## 2010-06-23 DIAGNOSIS — E785 Hyperlipidemia, unspecified: Secondary | ICD-10-CM

## 2010-06-23 NOTE — Patient Instructions (Signed)
Good to see you today. Medications reviewed, no prescription changes at this time. Use Aleve 2x/d for 1 week then as needed for athritis pain symptoms Will investigate home blood pressure options as requested

## 2010-06-23 NOTE — Assessment & Plan Note (Signed)
  upward trend in total and LDL since off Lipitor 10/2009 -  declines any other statin due to adv effects on lipitor (myalgia) pt opts to work on weight/exercise recheck 6 mo Lab Results  Component Value Date   WBC 4.0* 01/21/2010   HGB 14.4 01/21/2010   HCT 41.3 01/21/2010   PLT 237.0 01/21/2010   CHOL 234* 02/09/2010   TRIG 133.0 02/09/2010   HDL 50.00 02/09/2010   LDLDIRECT 168.9 02/09/2010   ALT 20 01/21/2010   AST 27 01/21/2010   NA 141 08/05/2009   K 4.1 08/05/2009   CL 104 08/05/2009   CREATININE 0.65 08/05/2009   BUN 18 08/05/2009   CO2 26 08/05/2009   TSH 0.96 11/02/2009

## 2010-06-23 NOTE — Progress Notes (Signed)
Subjective:    Patient ID: Joan Welch, female    DOB: [birth year], 70 y.o.   MRN: [id number]  HPI   HTN f/u - SBP 130-150s in 04/2010 (home BP reviewed) working on Na reduction and changed to aerobic activity Started on ARB tx 05/2010 for same - tolerating medication well but continued variable SBP 130-140  chronic  BPPV -dizzy symptoms - onset summer 2011 while living in [place] - dx by provider there with same -  symptoms progressive but present for last few years to lesser extent symptoms unimproved with self manuevers no HA, no hearing change or ringing - no vision change feels "sloshing" in left ear but no discharge or pain also a/w nausea but no falls s/p OP vestib rehab eval and ENT eval 04/2010  IBS, constipation predominate - long hx same - worse since retirement 10/2009 - episode flares 50% time, prev only 2-3x/week a/w bloating and nausea but no vomitting -  +lower abd discomfort but not painful overwhelming fatigue during flare improved with probiotic and gas x  glaucoma - reports compliance with ongoing medical treatment and no changes in medication dose or frequency. denies adverse side effects related to current therapy. no eye pain or vision changes - follows with optho  dyslipidemia -prev on rx med tx for same - lipitor - self dc'd same 10/2009 due to muscle fatigue which has improved off med - - follows low fat diet and regular exercise  OA - follows with ortho at [facility] for same - concern for pain at base of thumbs bilaterally  Past Medical History  Diagnosis Date  . Glaucoma   . IBS (irritable bowel syndrome)   . H. pylori infection   . Headache   . Hyperlipidemia   . Hypertension   . Urinary incontinence     oab  . Arthritis       Review of Systems  Constitutional: Negative for unexpected weight change.  Respiratory: Negative for cough and shortness of breath.   Cardiovascular: Negative for chest pain.  Gastrointestinal: Negative for abdominal pain.    Skin: Negative for rash.  Neurological: Negative for dizziness.       Objective:   Physical Exam Filed Vitals:   06/23/10 1558  BP: 120/72  Pulse: 58  Temp: 98.4 F (36.9 C)    Physical Exam  Constitutional: She is oriented to person, place, and time. She appears well-developed and well-nourished. No distress. Cardiovascular: Normal rate, regular rhythm and normal heart sounds.  No murmur heard. Pulmonary/Chest: Effort normal and breath sounds normal. No respiratory distress. She has no wheezes.  Musculoskeletal: Normal range of motion at wrists. Mild CMC pain with ROM, nontender to palpation. She exhibits no edema.  Neurological: She is alert and oriented to person, place, and time. No cranial nerve deficit. Coordination normal.  Skin: Skin is warm and dry. No rash noted. No erythema.  Psychiatric: She has a normal mood and affect. Her behavior is normal. Judgment and thought content normal.       Lab Results  Component Value Date   WBC 4.0* 01/21/2010   HGB 14.4 01/21/2010   HCT 41.3 01/21/2010   PLT 237.0 01/21/2010   CHOL 234* 02/09/2010   TRIG 133.0 02/09/2010   HDL 50.00 02/09/2010   LDLDIRECT 168.9 02/09/2010   ALT 20 01/21/2010   AST 27 01/21/2010   NA 141 08/05/2009   K 4.1 08/05/2009   CL 104 08/05/2009   CREATININE 0.65 08/05/2009   BUN 18 08/05/2009  CO2 26 08/05/2009   TSH 0.96 11/02/2009      Assessment & Plan:  See problem list. Medications and labs reviewed today. Time spent with pt today 25 minutes, greater than 50% time spent counseling patient on hypertension and CMC arthritis and medication review. Also review of prior records

## 2010-06-23 NOTE — Assessment & Plan Note (Signed)
Improved - pt requests recommendation for home BP cuff - will investigate same Cont same meds w/o change at this time/

## 2010-06-23 NOTE — Assessment & Plan Note (Signed)
CMC pain at base of both thumbs - has splints from prior hand eval related to same Anatomy and physiology of arthritis reviewed - difference from good "bone density" explained Use otc nsaids rec

## 2010-07-29 ENCOUNTER — Telehealth: Payer: Self-pay

## 2010-07-29 NOTE — Telephone Encounter (Signed)
Pt called stating she would like to try Melatonin for sleep aid but was advised that she needed to consult MD first because she has HTN. Please advise, safe for pt to take?

## 2010-07-29 NOTE — Telephone Encounter (Signed)
Yes, ok to use - recommend trying 5mg  qhs or less as needed

## 2010-07-30 NOTE — Telephone Encounter (Signed)
Pt advised via VM 

## 2010-08-17 ENCOUNTER — Ambulatory Visit: Payer: Self-pay | Admitting: Internal Medicine

## 2010-08-19 ENCOUNTER — Encounter: Payer: Self-pay | Admitting: Internal Medicine

## 2010-08-24 ENCOUNTER — Encounter: Payer: Self-pay | Admitting: Internal Medicine

## 2010-08-25 ENCOUNTER — Telehealth: Payer: Self-pay

## 2010-08-25 DIAGNOSIS — I10 Essential (primary) hypertension: Secondary | ICD-10-CM

## 2010-08-25 DIAGNOSIS — E785 Hyperlipidemia, unspecified: Secondary | ICD-10-CM

## 2010-08-25 NOTE — Telephone Encounter (Signed)
Pt advised via VM 

## 2010-08-25 NOTE — Telephone Encounter (Signed)
Pt called requesting to have labs drawn prior to appt tomorrow morning. Pt states she needs to eat frequently through out the day. Okay to order?

## 2010-08-25 NOTE — Telephone Encounter (Signed)
Ok - cholesterol and Bmet - placed future orders

## 2010-08-26 ENCOUNTER — Ambulatory Visit (INDEPENDENT_AMBULATORY_CARE_PROVIDER_SITE_OTHER): Payer: Medicare Other | Admitting: Internal Medicine

## 2010-08-26 ENCOUNTER — Other Ambulatory Visit (INDEPENDENT_AMBULATORY_CARE_PROVIDER_SITE_OTHER): Payer: Medicare Other

## 2010-08-26 ENCOUNTER — Encounter: Payer: Self-pay | Admitting: Internal Medicine

## 2010-08-26 DIAGNOSIS — E785 Hyperlipidemia, unspecified: Secondary | ICD-10-CM

## 2010-08-26 DIAGNOSIS — I10 Essential (primary) hypertension: Secondary | ICD-10-CM

## 2010-08-26 DIAGNOSIS — R32 Unspecified urinary incontinence: Secondary | ICD-10-CM

## 2010-08-26 LAB — BASIC METABOLIC PANEL
CO2: 29 mEq/L (ref 19–32)
Chloride: 106 mEq/L (ref 96–112)
Creatinine, Ser: 0.6 mg/dL (ref 0.4–1.2)

## 2010-08-26 LAB — LIPID PANEL
Total CHOL/HDL Ratio: 3
Triglycerides: 53 mg/dL (ref 0.0–149.0)

## 2010-08-26 NOTE — Assessment & Plan Note (Signed)
  upward trend in total and LDL since off Lipitor 10/2009 - recheck done today Previously declined any statin due to adv effects on lipitor (myalgia) pt has worked on lifestyle control with weight/exercise efforts Reconsider alt statin or nonstatin options depending on results

## 2010-08-26 NOTE — Progress Notes (Signed)
Subjective:    Patient ID: Joan Welch, female    DOB: [birth year], 70 y.o.   MRN: [id number]  HPI  Here for follow up - reviewed chronic medical issues:  HTN  - SBP 120-130s  (home BP reviewed) working on Na reduction and increase aerobic activity Started on ARB tx 05/2010 for same - tolerating medication well   episodic  BPPV -dizzy symptoms - onset summer 2011 while living in [place] - dx by provider there with same -  symptoms unimproved with self manuevers and meclizine no HA, no hearing change or ringing - no vision change also a/w nausea but no falls s/p OP vestib rehab eval and ENT eval 04/2010 - improved at this time  IBS, constipation predominate - long hx same - worse since retirement 10/2009 - episode flares 50% time, prev only 2-3x/week a/w bloating and nausea but no vomitting -  +lower abd discomfort but not painful overwhelming fatigue during flare ? improved with probiotic and gas x - takes as needed  glaucoma - reports compliance with ongoing medical treatment and no changes in medication dose or frequency. denies adverse side effects related to current therapy. no eye pain or vision changes - follows with optho  dyslipidemia -prev on lipitor for same -self dc'd med 10/2009 due to muscle fatigue which has improved off med - follows low fat diet and regular exercise  OA, CMC arthritis B thumbs - uses Aleve as needed - follows with ortho at [facility] for same -   Past Medical History  Diagnosis Date  . Glaucoma   . IBS (irritable bowel syndrome)   . H. pylori infection   . Headache   . Hyperlipidemia   . Hypertension   . Urinary incontinence     oab  . Arthritis       Review of Systems  Respiratory: Negative for shortness of breath.   Cardiovascular: Negative for chest pain.  Neurological: Negative for dizziness.       Objective:   Physical Exam BP 128/72  Pulse 59  Temp(Src) 97 F (36.1 C) (Oral)  Ht 5\' 1"  (1.549 m)  Wt 120 lb 1.9 oz (54.486 kg)   BMI 22.70 kg/m2  SpO2 98% BP Readings from Last 3 Encounters:  08/26/10 128/72  06/23/10 120/72  05/11/10 152/72   Wt Readings from Last 3 Encounters:  08/26/10 120 lb 1.9 oz (54.486 kg)  06/23/10 120 lb (54.432 kg)  05/11/10 123 lb 12.8 oz (56.155 kg)     Physical Exam  Constitutional: She is oriented to person, place, and time. She appears well-developed and well-nourished. No distress. Cardiovascular: Normal rate, regular rhythm and normal heart sounds.  No murmur heard. No BLE edema. Pulmonary/Chest: Effort normal and breath sounds normal. No respiratory distress. She has no wheezes.  Psychiatric: She has a normal mood and affect. Her behavior is normal. Judgment and thought content normal.       Lab Results  Component Value Date   WBC 4.0* 01/21/2010   HGB 14.4 01/21/2010   HCT 41.3 01/21/2010   PLT 237.0 01/21/2010   CHOL 234* 02/09/2010   TRIG 133.0 02/09/2010   HDL 50.00 02/09/2010   LDLDIRECT 168.9 02/09/2010   ALT 20 01/21/2010   AST 27 01/21/2010   NA 141 08/05/2009   K 4.1 08/05/2009   CL 104 08/05/2009   CREATININE 0.65 08/05/2009   BUN 18 08/05/2009   CO2 26 08/05/2009   TSH 0.96 11/02/2009      Assessment &  Plan:  See problem list. Medications reviewed today. Labs done this AM Time spent with pt today 25 minutes, greater than 50% time spent counseling patient on hypertension, dyslipidemia and medication review. Also review of prior OV

## 2010-08-26 NOTE — Patient Instructions (Signed)
It was good to see you today. I will call you with the results once available Look into the options for sleep and cholesterol as discussed Please schedule followup in 6 months, call sooner if problems.

## 2010-08-26 NOTE — Assessment & Plan Note (Signed)
Controlled with enablex but still with dry mouth SE

## 2010-08-26 NOTE — Assessment & Plan Note (Signed)
Improved on low dose ARB - The current medical regimen is effective;  continue present plan and medications. BP Readings from Last 3 Encounters:  08/26/10 128/72  06/23/10 120/72  05/11/10 152/72

## 2010-09-02 ENCOUNTER — Other Ambulatory Visit: Payer: Self-pay | Admitting: Internal Medicine

## 2010-09-22 ENCOUNTER — Ambulatory Visit: Payer: Medicare Other | Admitting: Internal Medicine

## 2010-11-11 ENCOUNTER — Other Ambulatory Visit: Payer: Self-pay | Admitting: Internal Medicine

## 2010-11-11 DIAGNOSIS — Z1231 Encounter for screening mammogram for malignant neoplasm of breast: Secondary | ICD-10-CM

## 2010-11-15 ENCOUNTER — Other Ambulatory Visit (INDEPENDENT_AMBULATORY_CARE_PROVIDER_SITE_OTHER): Payer: Medicare Other

## 2010-11-15 ENCOUNTER — Encounter: Payer: Self-pay | Admitting: Internal Medicine

## 2010-11-15 ENCOUNTER — Ambulatory Visit (INDEPENDENT_AMBULATORY_CARE_PROVIDER_SITE_OTHER): Payer: Medicare Other | Admitting: Internal Medicine

## 2010-11-15 DIAGNOSIS — K589 Irritable bowel syndrome without diarrhea: Secondary | ICD-10-CM

## 2010-11-15 DIAGNOSIS — I10 Essential (primary) hypertension: Secondary | ICD-10-CM

## 2010-11-15 DIAGNOSIS — R109 Unspecified abdominal pain: Secondary | ICD-10-CM

## 2010-11-15 LAB — URINALYSIS
Bilirubin Urine: NEGATIVE
Hgb urine dipstick: NEGATIVE
Total Protein, Urine: NEGATIVE
Urine Glucose: NEGATIVE
Urobilinogen, UA: 0.2 (ref 0.0–1.0)

## 2010-11-15 LAB — CBC WITH DIFFERENTIAL/PLATELET
Basophils Relative: 0.8 % (ref 0.0–3.0)
Eosinophils Absolute: 0.1 10*3/uL (ref 0.0–0.7)
Hemoglobin: 14.5 g/dL (ref 12.0–15.0)
Lymphocytes Relative: 38.6 % (ref 12.0–46.0)
Monocytes Relative: 7.5 % (ref 3.0–12.0)
Neutro Abs: 2.2 10*3/uL (ref 1.4–7.7)
Neutrophils Relative %: 50.8 % (ref 43.0–77.0)
RBC: 4.95 Mil/uL (ref 3.87–5.11)
WBC: 4.3 10*3/uL — ABNORMAL LOW (ref 4.5–10.5)

## 2010-11-15 LAB — HEPATIC FUNCTION PANEL
ALT: 19 U/L (ref 0–35)
AST: 28 U/L (ref 0–37)
Bilirubin, Direct: 0.1 mg/dL (ref 0.0–0.3)
Total Bilirubin: 0.8 mg/dL (ref 0.3–1.2)
Total Protein: 6.8 g/dL (ref 6.0–8.3)

## 2010-11-15 LAB — BASIC METABOLIC PANEL
BUN: 16 mg/dL (ref 6–23)
CO2: 29 mEq/L (ref 19–32)
Chloride: 103 mEq/L (ref 96–112)
Creatinine, Ser: 0.5 mg/dL (ref 0.4–1.2)
Potassium: 4.5 mEq/L (ref 3.5–5.1)

## 2010-11-15 LAB — TSH: TSH: 0.91 u[IU]/mL (ref 0.35–5.50)

## 2010-11-15 NOTE — Patient Instructions (Signed)
It was good to see you today. Test(s) ordered today. Your results will be called to you after review (48-72hours after test completion). If any changes need to be made, you will be notified at that time. we'll make referral to gastroenterologist as discussed  . Our office will contact you regarding appointment(s) once made. Medications reviewed, no changes at this time.

## 2010-11-15 NOTE — Progress Notes (Signed)
Subjective:    Patient ID: Joan Welch, female    DOB: [birth year], 70 y.o.   MRN: [id number]  HPI  Here for "stomach problems" IBS, constipation predominate but also diarrhea spells -  long hx same - worse since retirement 10/2009 - episode flares >50% time - most days of the week, prev only 2-3x/week a/w bloating, flatulence and overwhelming nausea but no vomitting -  +lower bilateral LQ abd "discomfort" but not pain, no cramps overwhelming fatigue during flare ? improved with probiotic and gas x - takes as needed Intentional weight loss efforts with mild weight loss noted Worse with certain foods - chocolate and peppercorn, seeded fruits  Also reviewed chronic medical issues:  HTN  - SBP 120-130s  (home BP reviewed) working on Na reduction and increase aerobic activity Started on ARB tx 05/2010 for same - tolerating medication well   episodic BPPV -dizzy symptoms - onset summer 2011 while living in [place] - dx by provider there with same -  symptoms unimproved with self manuevers and meclizine no HA, no hearing change or ringing - no vision change also a/w nausea but no falls s/p OP vestib rehab eval and ENT eval 04/2010 - improved at this time  glaucoma - reports compliance with ongoing medical treatment and no changes in medication dose or frequency. denies adverse side effects related to current therapy. no eye pain or vision changes - follows with optho  dyslipidemia -prev on lipitor for same -self dc'd med 10/2009 due to muscle fatigue which has improved off med - follows low fat diet and regular exercise  OA, CMC arthritis B thumbs - uses Aleve as needed - follows with ortho at [facility] for same -   Past Medical History  Diagnosis Date  . Glaucoma   . IBS (irritable bowel syndrome)   . H. pylori infection   . Headache   . Hyperlipidemia   . Hypertension   . Urinary incontinence     oab  . Arthritis     Review of Systems  Respiratory: Negative for shortness of  breath.   Cardiovascular: Negative for chest pain.  Neurological: Negative for dizziness.       Objective:   Physical Exam  BP 110/80  Pulse 63  Temp(Src) 98 F (36.7 C) (Oral)  Ht 5\' 1"  (1.549 m)  Wt 118 lb (53.524 kg)  BMI 22.30 kg/m2  SpO2 98% BP Readings from Last 3 Encounters:  11/15/10 110/80  08/26/10 128/72  06/23/10 120/72   Wt Readings from Last 3 Encounters:  11/15/10 118 lb (53.524 kg)  08/26/10 120 lb 1.9 oz (54.486 kg)  06/23/10 120 lb (54.432 kg)   Constitutional: She is oriented to person, place, and time. She appears well-developed and well-nourished. No distress. Cardiovascular: Normal rate, regular rhythm and normal heart sounds.  No murmur heard. No BLE edema. Pulmonary/Chest: Effort normal and breath sounds normal. No respiratory distress. She has no wheezes.  Abd: soft, .nontender, +BS, no mass, no rebound or gaurding Psychiatric: She has a normal mood and affect. Her behavior is normal. Judgment and thought content normal.      Lab Results  Component Value Date   WBC 4.0* 01/21/2010   HGB 14.4 01/21/2010   HCT 41.3 01/21/2010   PLT 237.0 01/21/2010   CHOL 195 08/26/2010   TRIG 53.0 08/26/2010   HDL 55.80 08/26/2010   LDLDIRECT 168.9 02/09/2010   ALT 20 01/21/2010   AST 27 01/21/2010   NA 140 08/26/2010   K  4.5 08/26/2010   CL 106 08/26/2010   CREATININE 0.6 08/26/2010   BUN 25* 08/26/2010   CO2 29 08/26/2010   TSH 0.96 11/02/2009     Assessment & Plan:  See problem list. Medications reviewed today.   IBS, chronic symptoms but unimproved with symptoms tx -no red flags on hx or exam; last colo 2007 - abd [place] 01/2010 unremarkable; celiac ab panel negative 01/2010 - refer for GI eval and tx at pt preference and recheck labs today, ?repeat colo - continue probiotics

## 2010-11-19 ENCOUNTER — Ambulatory Visit (HOSPITAL_COMMUNITY)
Admission: RE | Admit: 2010-11-19 | Discharge: 2010-11-19 | Disposition: A | Discharge status: Home / Self Care | Payer: Medicare Other | Source: Ambulatory Visit | Attending: Internal Medicine | Admitting: Internal Medicine

## 2010-11-19 DIAGNOSIS — Z1231 Encounter for screening mammogram for malignant neoplasm of breast: Secondary | ICD-10-CM | POA: Insufficient documentation

## 2010-12-13 ENCOUNTER — Encounter: Payer: Self-pay | Admitting: Gastroenterology

## 2010-12-13 ENCOUNTER — Ambulatory Visit (INDEPENDENT_AMBULATORY_CARE_PROVIDER_SITE_OTHER): Payer: Medicare Other | Admitting: Gastroenterology

## 2010-12-13 VITALS — BP 124/60 | HR 80 | Ht 61.0 in | Wt 119.0 lb

## 2010-12-13 DIAGNOSIS — K5900 Constipation, unspecified: Secondary | ICD-10-CM

## 2010-12-13 DIAGNOSIS — R109 Unspecified abdominal pain: Secondary | ICD-10-CM

## 2010-12-13 MED ORDER — POLYETHYLENE GLYCOL 3350 17 GM/SCOOP PO POWD: ORAL | Status: DC

## 2010-12-13 MED ORDER — NA SULFATE-K SULFATE-MG SULF 17.5-3.13-1.6 GM/177ML PO SOLN: 1.0000 | Freq: Once | ORAL | Status: DC

## 2010-12-13 NOTE — Patient Instructions (Signed)
You have been scheduled for a colonoscopy. Please follow written instructions given to you at your visit today.  Please pick up your Suprep kit at the pharmacy within the next 2-3 days. Take Miralax over the counter. Take 1/2 capful (or up to 2 capfuls) dissolved in water or juice once daily as needed. You may titrate the dose as needed. CC: Dr [name]

## 2010-12-13 NOTE — Progress Notes (Signed)
History of Present Illness: This is a 70 year old female with a long history of irritable bowel syndrome and constipation. Her symptoms have generally been mild over the past 10 years or so. She has chronic constipation with intermittent lower abdominal pain, bloating and occasional diarrhea phases. She states beginning July 22 her symptoms became much more active with a worsening of all above symptoms. She is taking a fiber supplement and probiotics without significant change in symptoms. Blood work performed by Dr. [name] on August 13 was unremarkable. She states she had a colonoscopy performed by Dr. Reece Packer in 2004 that was unremarkable. A celiac panel and abdominal ultrasound were performed last year that were normal. Denies weight loss, change in stool caliber, melena, hematochezia, nausea, vomiting, dysphagia, reflux symptoms, chest pain.  Past Medical History  Diagnosis Date  . Glaucoma   . IBS (irritable bowel syndrome)   . H. pylori infection   . Headache   . Hyperlipidemia   . Hypertension   . Urinary incontinence     oab  . Arthritis    Past Surgical History  Procedure Date  . Bladder suspension in 1986 & 1987  . Abdominal hysterectomy 1998  . Breast biopsy 1997    reports that she has never smoked. She has never used smokeless tobacco. She reports that she drinks alcohol. She reports that she does not use illicit drugs. family history includes Breast cancer in her sister; Diabetes in her daughter; and Irritable bowel syndrome in her mother.  There is no history of Colon cancer. No Known Allergies  Outpatient Encounter Prescriptions as of 12/13/2010  Medication Sig Dispense Refill  . ALPHAGAN P 0.1 % SOLN Place 1 drop into both eyes 2 (two) times daily.       Marland Kitchen losartan (COZAAR) 25 MG tablet take 1 tablet by mouth once daily  30 tablet  5  . Omega-3 Fatty Acids (FISH OIL) 1000 MG CAPS Take by mouth as needed.       . Probiotic Product (PROBIOTIC PO) Take by mouth. Will take  one by mouth once daily and some days will take two       . Na Sulfate-K Sulfate-Mg Sulf SOLN Take 1 kit by mouth once.  1 Bottle  0  . polyethylene glycol powder (GLYCOLAX/MIRALAX) powder Dissolve 1/2 capful (up to 2 capfuls) in at least 8 ounces water/juice and drink once daily as needed  255 g  0   Review of Systems: Pertinent positive and negative review of systems were noted in the above HPI section. All other review of systems were otherwise negative.  Physical Exam: General: Well developed , well nourished, no acute distress Head: Normocephalic and atraumatic Eyes:  sclerae anicteric, EOMI Ears: Normal auditory acuity Mouth: No deformity or lesions Neck: Supple, no masses or thyromegaly Lungs: Clear throughout to auscultation Heart: Regular rate and rhythm; no murmurs, rubs or bruits Abdomen: Soft, non tender and non distended. No masses, hepatosplenomegaly or hernias noted. Normal Bowel sounds Rectal: Deferred to colonoscopy Musculoskeletal: Symmetrical with no gross deformities  Skin: No lesions on visible extremities Pulses:  Normal pulses noted Extremities: No clubbing, cyanosis, edema or deformities noted Neurological: Alert oriented x 4, grossly nonfocal Cervical Nodes:  No significant cervical adenopathy Inguinal Nodes: No significant inguinal adenopathy Psychological:  Alert and cooperative. Normal mood and affect  Assessment and Recommendations:  1. Irritable bowel syndrome, constipation predominant. She occasionally has alternating phases. Symptoms have worsened substantially since July. Request records from Dr. Reece Packer. Rule out colorectal  neoplasms, inflammatory bowel disease and other disorders. Begin MiraLax 1/2 dose to 2 doses daily titrated for adequate bowel movements. Consider adding an anti-spasmodic if her symptoms do not respond to laxatives. Consider a trial of different probiotics. Consider a trial of Xifaxan. Schedule colonoscopy. The risks, benefits, and  alternatives to colonoscopy with possible biopsy and possible polypectomy were discussed with the patient and they consent to proceed.

## 2011-01-11 ENCOUNTER — Telehealth: Payer: Self-pay

## 2011-01-11 NOTE — Telephone Encounter (Signed)
Pt called stating she believes her blood pressure medication is not controling blood pressure well. She says she was not previously checking her blood pressure regularly but she has recently bought a monitor and her readings vary from 164/83 to 115/66 from Sept 29 until this morning

## 2011-01-11 NOTE — Telephone Encounter (Signed)
Increase losartan to 50mg  daily - call if SBP>140s on regular basis - erx for new dose done - thanks

## 2011-01-12 ENCOUNTER — Encounter: Payer: Self-pay | Admitting: Gastroenterology

## 2011-01-12 ENCOUNTER — Ambulatory Visit (AMBULATORY_SURGERY_CENTER): Payer: Medicare Other | Admitting: Gastroenterology

## 2011-01-12 DIAGNOSIS — R198 Other specified symptoms and signs involving the digestive system and abdomen: Secondary | ICD-10-CM

## 2011-01-12 DIAGNOSIS — R109 Unspecified abdominal pain: Secondary | ICD-10-CM

## 2011-01-12 DIAGNOSIS — D126 Benign neoplasm of colon, unspecified: Secondary | ICD-10-CM

## 2011-01-12 DIAGNOSIS — K5900 Constipation, unspecified: Secondary | ICD-10-CM

## 2011-01-12 MED ORDER — LOSARTAN POTASSIUM 50 MG PO TABS: 50.0000 mg | ORAL_TABLET | Freq: Every day | ORAL | Status: DC

## 2011-01-12 MED ORDER — SODIUM CHLORIDE 0.9 % IV SOLN: 500.0000 mL | INTRAVENOUS | Status: DC

## 2011-01-12 NOTE — Patient Instructions (Signed)
Please review discharge instructions (blue and green sheets)  Hold Aspirin, Aspirin containing products, and anti-inflammatory medications for 2 weeks  Follow high fiber diet with liberal fluid intake  Resume other medications

## 2011-01-12 NOTE — Progress Notes (Signed)
Pt's VSS in recovery room, passing air without difficulty, awaking slowly.  Friend at bedside and discharge instructions given to her

## 2011-01-12 NOTE — Telephone Encounter (Signed)
Pt advised of Rx.pharmacy and MD's recommendations

## 2011-01-13 ENCOUNTER — Telehealth: Payer: Self-pay | Admitting: *Deleted

## 2011-01-13 NOTE — Telephone Encounter (Signed)
Unable to leave message due to no ID on voice mail.

## 2011-01-17 ENCOUNTER — Encounter: Payer: Self-pay | Admitting: Gastroenterology

## 2011-02-28 ENCOUNTER — Ambulatory Visit: Payer: Medicare Other | Admitting: Internal Medicine

## 2011-03-16 ENCOUNTER — Ambulatory Visit (INDEPENDENT_AMBULATORY_CARE_PROVIDER_SITE_OTHER): Payer: Medicare Other | Admitting: Internal Medicine

## 2011-03-16 ENCOUNTER — Encounter: Payer: Self-pay | Admitting: Internal Medicine

## 2011-03-16 DIAGNOSIS — M940 Chondrocostal junction syndrome [Tietze]: Secondary | ICD-10-CM

## 2011-03-16 DIAGNOSIS — E785 Hyperlipidemia, unspecified: Secondary | ICD-10-CM

## 2011-03-16 DIAGNOSIS — I10 Essential (primary) hypertension: Secondary | ICD-10-CM

## 2011-03-16 NOTE — Assessment & Plan Note (Signed)
Improved on low dose ARB -  The current medical regimen is effective;  continue present plan and medications. BP Readings from Last 3 Encounters:  03/16/11 122/80  01/12/11 137/57  12/13/10 124/60

## 2011-03-16 NOTE — Patient Instructions (Addendum)
It was good to see you today. We have reviewed your home blood pressure readings - no medication changes recommended at this time. Please call significant recurrent episodes of irregular heartbeat are evident on your monitor  Goal systolic pressure less than 140 Use over-the-counter Aleve as discussed for muscle skeletal symptoms Please schedule followup in 6 months for blood pressure and cholesterol check, call sooner if problems. Costochondritis Costochondritis (Tietze syndrome), or costochondral separation, is a swelling and irritation (inflammation) of the tissue (cartilage) that connects your ribs with your breastbone (sternum). It may occur on its own (spontaneously), through damage caused by an accident (trauma), or simply from coughing or minor exercise. It may take up to 6 weeks to get better and longer if you are unable to be conservative in your activities. HOME CARE INSTRUCTIONS    Avoid exhausting physical activity. Try not to strain your ribs during normal activity. This would include any activities using chest, belly (abdominal), and side muscles, especially if heavy weights are used.     Use ice for 15 to 20 minutes per hour while awake for the first 2 days. Place the ice in a plastic bag, and place a towel between the bag of ice and your skin.     Only take over-the-counter or prescription medicines for pain, discomfort, or fever as directed by your caregiver.  SEEK IMMEDIATE MEDICAL CARE IF:    Your pain increases or you are very uncomfortable.     You have a fever.     You develop difficulty with your breathing.     You cough up blood.     You develop worse chest pains, shortness of breath, sweating, or vomiting.     You develop new, unexplained problems (symptoms).  MAKE SURE YOU:    Understand these instructions.     Will watch your condition.     Will get help right away if you are not doing well or get worse.  Document Released: 12/29/2004 Document Revised:  12/01/2010 Document Reviewed: 11/07/2007 [facility] Patient Information 2012 [place], [place].

## 2011-03-16 NOTE — Progress Notes (Signed)
Subjective:    Patient ID: GWEN SARVIS, female    DOB: Apr 13, 1940, 70 y.o.   MRN: [id number]  HPI  Here for follow up - reviewed chronic medical issues:  HTN  - SBP 110-150s  (home BP log reviewed) working on low Na diet and regular aerobic activity Started on ARB tx 05/2010 for same, titrated up summer 2012 - tolerating medication well  New home blood pressure machine notes periodic "irregularity" on monitor for 3 days in October 2012 but denies palpitations, shortness of breath or fluttering sensation  Urinary incontinence, mixed Works with urology for same Various antispasmodics and anticholinergics tried without relief Working on physical therapy and biofeedback for same since 2011  Chronic episodic BPPV -dizzy symptoms - onset summer 2011 while living in [place] - dx by provider there with same -  symptoms unimproved with self manuevers and meclizine no headache, no hearing change or ringing - no vision change also associated with nausea but no falls Participated in OP vestib rehab eval and ENT eval 04/2010 - no current symptoms at this time  IBS, constipation predominate - long hx same - worse since retirement 10/2009 - Colonoscopy with GI evaluation fall 2012> adenomatous polyp but otherwise unremarkable Continued episodic flares  2-3x/week - "but manageable" associated with bloating and nausea but no vomitting -  +lower abd discomfort but not painful overwhelming fatigue during flare improved with probiotic and gas x - takes as needed  dyslipidemia -prev on lipitor for same -self dc'd med 10/2009 due to muscle fatigue which has improved off med - follows low fat diet and regular exercise  OA, CMC arthritis B thumbs - uses Aleve as needed - follows with ortho at [facility] for same -   Past Medical History  Diagnosis Date  . Glaucoma   . IBS (irritable bowel syndrome)   . H. pylori infection   . Headache   . Hyperlipidemia   . Hypertension   . Urinary incontinence     oab    . Arthritis     Review of Systems  Respiratory: Negative for shortness of breath.   Cardiovascular: Positive for chest pain (mild anterior pain on right side of chest, tender with movement or direct pressure -ongoing x2 weeks). Negative for palpitations.  Neurological: Negative for dizziness.       Objective:   Physical Exam  BP 122/80  Pulse 63  Temp(Src) 97.9 F (36.6 C) (Oral)  Wt 119 lb (53.978 kg)  SpO2 98%  Wt Readings from Last 3 Encounters:  03/16/11 119 lb (53.978 kg)  01/12/11 119 lb (53.978 kg)  12/13/10 119 lb (53.978 kg)   Constitutional: She appears well-developed and well-nourished. No distress. Chest wall: Tender to palpation over right costal sternal margin Cardiovascular: Normal rate, regular rhythm and normal heart sounds.  No murmur heard. No BLE edema. Pulmonary/Chest: Effort normal and breath sounds normal. No respiratory distress. She has no wheezes.  Musculoskeletal: Right shoulder with normal range of motion causes pain over right costochondritis area with movement Psychiatric: She has a normal mood and affect. Her behavior is normal. Judgment and thought content normal.       Lab Results  Component Value Date   WBC 4.3* 11/15/2010   HGB 14.5 11/15/2010   HCT 43.2 11/15/2010   PLT 228.0 11/15/2010   CHOL 195 08/26/2010   TRIG 53.0 08/26/2010   HDL 55.80 08/26/2010   LDLDIRECT 168.9 02/09/2010   ALT 19 11/15/2010   AST 28 11/15/2010   NA  139 11/15/2010   K 4.5 11/15/2010   CL 103 11/15/2010   CREATININE 0.5 11/15/2010   BUN 16 11/15/2010   CO2 29 11/15/2010   TSH 0.91 11/15/2010     Assessment & Plan:  See problem list. Medications reviewed today.  Costochondritis, right-sided chest discomfort - reassurance provided, advised over-the-counter anti-inflammatories. Patient to call if symptoms worse or unimproved  Time spent with pt today 25 minutes, greater than 50% time spent counseling patient on hypertension, reviewing home blood pressure  readings, costochondritis and medication review. Also review of prior OV

## 2011-04-05 HISTORY — PX: BASAL CELL CARCINOMA EXCISION: SHX1214

## 2011-04-17 ENCOUNTER — Encounter (HOSPITAL_COMMUNITY): Payer: Self-pay | Admitting: Emergency Medicine

## 2011-04-17 ENCOUNTER — Emergency Department (HOSPITAL_COMMUNITY): Payer: Medicare Other

## 2011-04-17 ENCOUNTER — Observation Stay (HOSPITAL_COMMUNITY)
Admission: EM | Admit: 2011-04-17 | Discharge: 2011-04-18 | Disposition: A | Discharge status: Home / Self Care | Payer: Medicare Other | Attending: Internal Medicine | Admitting: Internal Medicine

## 2011-04-17 ENCOUNTER — Other Ambulatory Visit: Payer: Self-pay

## 2011-04-17 DIAGNOSIS — Z79899 Other long term (current) drug therapy: Secondary | ICD-10-CM | POA: Insufficient documentation

## 2011-04-17 DIAGNOSIS — Z90710 Acquired absence of both cervix and uterus: Secondary | ICD-10-CM | POA: Insufficient documentation

## 2011-04-17 DIAGNOSIS — K589 Irritable bowel syndrome without diarrhea: Secondary | ICD-10-CM | POA: Insufficient documentation

## 2011-04-17 DIAGNOSIS — R071 Chest pain on breathing: Secondary | ICD-10-CM | POA: Insufficient documentation

## 2011-04-17 DIAGNOSIS — Z01812 Encounter for preprocedural laboratory examination: Secondary | ICD-10-CM | POA: Insufficient documentation

## 2011-04-17 DIAGNOSIS — I10 Essential (primary) hypertension: Secondary | ICD-10-CM | POA: Insufficient documentation

## 2011-04-17 DIAGNOSIS — R0789 Other chest pain: Principal | ICD-10-CM | POA: Insufficient documentation

## 2011-04-17 DIAGNOSIS — Z01810 Encounter for preprocedural cardiovascular examination: Secondary | ICD-10-CM | POA: Insufficient documentation

## 2011-04-17 DIAGNOSIS — R079 Chest pain, unspecified: Secondary | ICD-10-CM | POA: Diagnosis present

## 2011-04-17 DIAGNOSIS — E785 Hyperlipidemia, unspecified: Secondary | ICD-10-CM | POA: Insufficient documentation

## 2011-04-17 LAB — CARDIAC PANEL(CRET KIN+CKTOT+MB+TROPI)
CK, MB: 2.4 ng/mL (ref 0.3–4.0)
CK, MB: 2.1 ng/mL (ref 0.3–4.0)
Total CK: 49 U/L (ref 7–177)
Total CK: 38 U/L (ref 7–177)
Troponin I: <0.3 ng/mL (ref <0.30)

## 2011-04-17 LAB — PRO B NATRIURETIC PEPTIDE: Pro B Natriuretic peptide (BNP): 182.9 pg/mL — ABNORMAL HIGH (ref 0–125)

## 2011-04-17 LAB — DIFFERENTIAL
Basophils Absolute: 0 10*3/uL (ref 0.0–0.1)
Basophils Relative: 1 % (ref 0–1)
Eosinophils Absolute: 0.1 10*3/uL (ref 0.0–0.7)
Eosinophils Relative: 3 % (ref 0–5)
Lymphocytes Relative: 48 % — ABNORMAL HIGH (ref 12–46)
Monocytes Absolute: 0.5 10*3/uL (ref 0.1–1.0)

## 2011-04-17 LAB — D-DIMER, QUANTITATIVE: D-Dimer, Quant: <0.22 ug/mL-FEU (ref 0.00–0.48)

## 2011-04-17 LAB — HEMOGLOBIN A1C
Hgb A1c MFr Bld: 5.4 % (ref <5.7)
Mean Plasma Glucose: 108 mg/dL (ref <117)

## 2011-04-17 LAB — CBC
MCH: 28.9 pg (ref 26.0–34.0)
MCHC: 34.3 g/dL (ref 30.0–36.0)
MCV: 84.3 fL (ref 78.0–100.0)
Platelets: 220 10*3/uL (ref 150–400)
RDW: 12.8 % (ref 11.5–15.5)
WBC: 5.2 10*3/uL (ref 4.0–10.5)

## 2011-04-17 LAB — BASIC METABOLIC PANEL
GFR calc non Af Amer: 90 mL/min — ABNORMAL LOW (ref >90)
Glucose, Bld: 90 mg/dL (ref 70–99)
Potassium: 3.6 mEq/L (ref 3.5–5.1)
Sodium: 142 mEq/L (ref 135–145)

## 2011-04-17 MED ORDER — ALUM & MAG HYDROXIDE-SIMETH 200-200-20 MG/5ML PO SUSP: 30.0000 mL | Freq: Four times a day (QID) | ORAL | Status: DC | PRN

## 2011-04-17 MED ORDER — LATANOPROST 0.005 % OP SOLN
1.0000 [drp] | Freq: Every day | OPHTHALMIC | Status: DC
  Administered 2011-04-17: 1 [drp] via OPHTHALMIC
  Filled 2011-04-17 (×2): qty 2.5

## 2011-04-17 MED ORDER — NITROGLYCERIN 0.4 MG SL SUBL
0.4000 mg | SUBLINGUAL_TABLET | SUBLINGUAL | Status: DC | PRN
  Administered 2011-04-17: 0.4 mg via SUBLINGUAL
  Filled 2011-04-17: qty 25

## 2011-04-17 MED ORDER — ACETAMINOPHEN 650 MG RE SUPP: 650.0000 mg | Freq: Four times a day (QID) | RECTAL | Status: DC | PRN

## 2011-04-17 MED ORDER — ACETAMINOPHEN 325 MG PO TABS: 650.0000 mg | ORAL_TABLET | Freq: Four times a day (QID) | ORAL | Status: DC | PRN

## 2011-04-17 MED ORDER — LOSARTAN POTASSIUM 50 MG PO TABS
50.0000 mg | ORAL_TABLET | Freq: Every day | ORAL | Status: DC
  Administered 2011-04-17: 50 mg via ORAL
  Filled 2011-04-17 (×2): qty 1

## 2011-04-17 MED ORDER — MORPHINE SULFATE 2 MG/ML IJ SOLN: 0.5000 mg | INTRAMUSCULAR | Status: DC | PRN

## 2011-04-17 MED ORDER — ASPIRIN 325 MG PO TABS: 324.0000 mg | ORAL_TABLET | Freq: Once | ORAL | Status: DC

## 2011-04-17 MED ORDER — POTASSIUM CHLORIDE IN NACL 20-0.9 MEQ/L-% IV SOLN
INTRAVENOUS | Status: DC
  Filled 2011-04-17 (×2): qty 1000

## 2011-04-17 MED ORDER — ASPIRIN EC 325 MG PO TBEC
325.0000 mg | DELAYED_RELEASE_TABLET | Freq: Every day | ORAL | Status: DC
  Administered 2011-04-17: 325 mg via ORAL
  Filled 2011-04-17 (×2): qty 1

## 2011-04-17 MED ORDER — MORPHINE SULFATE 4 MG/ML IJ SOLN: 4.0000 mg | Freq: Once | INTRAMUSCULAR | Status: DC

## 2011-04-17 MED ORDER — OMEGA-3-ACID ETHYL ESTERS 1 G PO CAPS
1.0000 g | ORAL_CAPSULE | Freq: Every day | ORAL | Status: DC
Start: 2011-04-17 — End: 2011-04-18
  Administered 2011-04-17: 1 g via ORAL
  Filled 2011-04-17 (×2): qty 1

## 2011-04-17 MED ORDER — ENOXAPARIN SODIUM 40 MG/0.4ML ~~LOC~~ SOLN
40.0000 mg | SUBCUTANEOUS | Status: DC
  Filled 2011-04-17 (×2): qty 0.4

## 2011-04-17 MED ORDER — POLYETHYLENE GLYCOL 3350 17 G PO PACK
17.0000 g | PACK | Freq: Every day | ORAL | Status: DC
  Filled 2011-04-17 (×2): qty 1

## 2011-04-17 MED ORDER — HYDROCODONE-ACETAMINOPHEN 5-325 MG PO TABS: 1.0000 | ORAL_TABLET | ORAL | Status: DC | PRN

## 2011-04-17 MED ORDER — GI COCKTAIL ~~LOC~~
30.0000 mL | Freq: Three times a day (TID) | ORAL | Status: DC | PRN
  Filled 2011-04-17: qty 30

## 2011-04-17 NOTE — ED Notes (Signed)
Patient states that she has pressure to her left chest and her arm since 4:45 Woke her up

## 2011-04-17 NOTE — ED Notes (Signed)
Attempt to call report, Receiving RN at lunch

## 2011-04-17 NOTE — ED Notes (Signed)
Pt c/o sudden onset left chest pain and dull pressure.  Pt denies hx of same.  Pt denies N/V, sweats, or dizziness.

## 2011-04-17 NOTE — ED Notes (Signed)
Pt report her cc is chest pain. Pt now only has chest pain with deep breathing. No sob. Pt reports the nitro lessen the pain but the pain is still there. Pt is alert, oriented, comfortable, denied any needs right now.

## 2011-04-17 NOTE — ED Provider Notes (Signed)
History     CSN: [id number]  Arrival date & time 04/17/11  4098   First MD Initiated Contact with Patient 04/17/11 [phone]      Chief Complaint  Patient presents with  . Chest Pain    (Consider location/radiation/quality/duration/timing/severity/associated sxs/prior treatment) Patient is a 71 y.o. female presenting with chest pain. The history is provided by the patient.  Chest Pain   She had onset at 4 AM of a left anterior chest pain with some radiation to the back. The pain was described as both sharp and squeezing. It was moderate in intensity and she rated the pain at 6/10. There was no associated dyspnea, nausea, or diaphoresis. Pain is worse if she moves her arms forward and is better she takes slow deep breaths. She has had similar pain like this in the past but has not talked with her doctor about them. She's not sure of the pain woke her up from sleep or not because she says she gets up multiple times during the night to go to the bathroom. Her cardiac risk factors include hypertension and borderline hyperlipidemia. She is not diabetic and does not smoke. She took a dose of aspirin at home and then came to the emergency department.  Past Medical History  Diagnosis Date  . Glaucoma   . IBS (irritable bowel syndrome)   . H. pylori infection   . Headache   . Hyperlipidemia   . Hypertension   . Urinary incontinence     oab  . Arthritis     Past Surgical History  Procedure Date  . Bladder suspension in 1986 & 1987  . Abdominal hysterectomy 1998  . Breast biopsy 1997  . Colonoscopy     Family History  Problem Relation Age of Onset  . Diabetes Daughter     Type 1  . Breast cancer Sister   . Irritable bowel syndrome Mother   . Colon cancer Neg Hx     History  Substance Use Topics  . Smoking status: Never Smoker   . 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 divorced, lives alone  . Alcohol Use: Yes     Social    OB History    Grav Para Term Preterm Abortions TAB SAB Ect Mult Living                  Review of Systems  Cardiovascular: Positive for chest pain.  All other systems reviewed and are negative.    Allergies  Review of patient's allergies indicates no known allergies.  Home Medications   Current Outpatient Rx  Name Route Sig Dispense Refill  . ALPHAGAN P 0.1 % OP SOLN Both Eyes Place 1 drop into both eyes 2 (two) times daily.     Marland Kitchen LOSARTAN POTASSIUM 50 MG PO TABS Oral Take 1 tablet (50 mg total) by mouth daily. 30 tablet 3  . MIRABEGRON ER 25 MG PO TB24 Oral Take by mouth daily.      Marland Kitchen FISH OIL 1000 MG PO CAPS Oral Take by mouth as needed.     Marland Kitchen POLYETHYLENE GLYCOL 3350 PO POWD  Dissolve 1/2 capful (up to 2 capfuls) in at least 8 ounces water/juice and drink once daily as needed 255 g 0  . PROBIOTIC PO Oral Take by mouth. Will take one by mouth once daily and some days  will take two       There were no vitals taken for this visit.  Physical Exam  Nursing note and vitals reviewed.  8-year-old female is resting comfortably and is in no acute distress. Vital signs are significant for moderate systolic hypertension with blood pressure 185/89. Oxygen saturations 100% which is normal. Head is normocephalic and atraumatic. PERRLA, EOMI. Oropharynx is clear. Neck is supple without adenopathy, JVD or bruit. Back is nontender. Lungs are clear without rales, wheezes, or rhonchi. Heart has regular rate and rhythm without murmur. There is moderate left anterior chest wall tenderness. Abdomen is soft, flat, nontender without masses or hepatosplenomegaly. Extremities have no cyanosis or edema, full range of motion is present. Skin is warm and moist without rash. Neurologic: Mental status is normal, cranial nerves are intact, there no focal motor or sensory deficits. Psychiatric: No abnormalities in  mood or affect.  ED Course  Procedures (including critical care time)  Results for orders placed during the hospital encounter of 04/17/11  BASIC METABOLIC PANEL      Component Value Range   Sodium 142  135 - 145 (mEq/L)   Potassium 3.6  3.5 - 5.1 (mEq/L)   Chloride 106  96 - 112 (mEq/L)   CO2 27  19 - 32 (mEq/L)   Glucose, Bld 90  70 - 99 (mg/dL)   BUN 18  6 - 23 (mg/dL)   Creatinine, Ser 5.62  0.50 - 1.10 (mg/dL)   Calcium 9.4  8.4 - 13.0 (mg/dL)   GFR calc non Af Amer 90 (*) >90 (mL/min)   GFR calc Af Amer >90  >90 (mL/min)  PRO B NATRIURETIC PEPTIDE      Component Value Range   Pro B Natriuretic peptide (BNP) 182.9 (*) 0 - 125 (pg/mL)  CARDIAC PANEL(CRET KIN+CKTOT+MB+TROPI)      Component Value Range   Total CK 49  7 - 177 (U/L)   CK, MB 2.4  0.3 - 4.0 (ng/mL)   Troponin I <0.30  <0.30 (ng/mL)   Relative Index RELATIVE INDEX IS INVALID  0.0 - 2.5   CBC      Component Value Range   WBC 5.2  4.0 - 10.5 (K/uL)   RBC 4.98  3.87 - 5.11 (MIL/uL)   Hemoglobin 14.4  12.0 - 15.0 (g/dL)   HCT 86.5  78.4 - 69.6 (%)   MCV 84.3  78.0 - 100.0 (fL)   MCH 28.9  26.0 - 34.0 (pg)   MCHC 34.3  30.0 - 36.0 (g/dL)   RDW 29.5  28.4 - 13.2 (%)   Platelets 220  150 - 400 (K/uL)  DIFFERENTIAL      Component Value Range   Neutrophils Relative 39 (*) 43 - 77 (%)   Neutro Abs 2.0  1.7 - 7.7 (K/uL)   Lymphocytes Relative 48 (*) 12 - 46 (%)   Lymphs Abs 2.5  0.7 - 4.0 (K/uL)   Monocytes Relative 10  3 - 12 (%)   Monocytes Absolute 0.5  0.1 - 1.0 (K/uL)   Eosinophils Relative 3  0 - 5 (%)   Eosinophils Absolute 0.1  0.0 - 0.7 (K/uL)   Basophils Relative 1  0 - 1 (%)   Basophils Absolute 0.0  0.0 - 0.1 (K/uL)   Dg Chest 2 View  04/17/2011  *RADIOLOGY REPORT*  Clinical Data: Mid chest pain; hypertension.  CHEST - 2 VIEW  Comparison: Chest radiograph performed 02/28/2006  Findings: The lungs are well-aerated and clear.  There is  no evidence of focal opacification, pleural effusion or  pneumothorax.  The heart is normal in size; the mediastinal contour is within normal limits.  No acute osseous abnormalities are seen.  IMPRESSION: No acute cardiopulmonary process seen.  Original Report Authenticated By: Tonia Ghent, M.D.      1. Chest pain     ECG shows sinus bradycardia with a rate of 57, no ectopy. Normal axis. Normal P wave. Normal QRS. Normal intervals. Normal ST and T waves. Impression: normal ECG. No old ECG is available for comparison.  0725: Patient initially refused nitroglycerin because "my pain wasn't that bad". After explaining the reason for nitroglycerin, she did agree to take one sublingual nitroglycerin pain decreased somewhat but did not go away completely. She currently rates her pain at 4/10. There're not worrisome components of her pain that I feel she should be admitted to the hospital for ongoing monitoring. Case is discussed with Dr. Rich Brave who agrees to admit the patient to telemetry under observation status. MDM  Chest pain which may be musculoskeletal in origin, but she will need serial markers to rule out cardiac cause. She will be given a trial of nitroglycerin.        Dione Booze, MD 04/17/11 [phone]

## 2011-04-17 NOTE — H&P (Signed)
Hospital Admission Note Date: 04/17/2011  Patient name: Joan Welch           Medical record number: [id number] Date of birth: 03-30-1941           Age: 71 y.o.   Gender: female    PCP:   Rene Paci, MD, MD   Chief Complaint:  Chest pain  HPI: Joan Welch is a 71 y.o. female with history of hypertension and hyperlipidemia. Patient given to the hospital complaining about chest pain. Patient woke up this morning about 3:45 and she experienced substernal chest pain. The pain radiated to be 6/10, crushing and does not radiate. She came into the emergency department for further evaluation. She was given nitroglycerin and she saw the pain is better a little bit. Also when she was asked to take deep breath she thought that made the pain worse. Patient says she exercise for an hour every other day and now she hikes about 5 miles every week. Patient will be placed in observation to rule out acute coronary syndrome.  Past Medical History: Past Medical History  Diagnosis Date  . Glaucoma   . IBS (irritable bowel syndrome)   . H. pylori infection   . Headache   . Hyperlipidemia   . Hypertension   . Urinary incontinence     oab  . Arthritis    Past Surgical History  Procedure Date  . Bladder suspension in 1986 & 1987  . Abdominal hysterectomy 1998  . Breast biopsy 1997  . Colonoscopy     Medications: Prior to Admission medications   Medication Sig Start Date End Date Taking? Authorizing Provider  latanoprost (XALATAN) 0.005 % ophthalmic solution Place 1 drop into both eyes at bedtime.   Yes Historical Provider, MD  losartan (COZAAR) 50 MG tablet Take 1 tablet (50 mg total) by mouth daily. 01/11/11  Yes Rene Paci, MD  omega-3 acid ethyl esters (LOVAZA) 1 G capsule Take 1 g by mouth daily.   Yes Historical Provider, MD  polyethylene glycol powder (GLYCOLAX/MIRALAX) powder Dissolve 1/2 capful (up to 2 capfuls) in at least 8 ounces water/juice and drink once daily as  needed 12/13/10  Yes Eliezer Bottom., MD,FACG  Probiotic Product (PROBIOTIC PO) Take by mouth. Will take one by mouth once daily and some days will take two    Yes Historical Provider, MD    Allergies:   Allergies  Allergen Reactions  . Caffeine Nausea Only    Extreme nervousness     Social History:  reports that she has never smoked. She has never used smokeless tobacco. She reports that she drinks alcohol. She reports that she does not use illicit drugs.  Family History: Family History  Problem Relation Age of Onset  . Diabetes Daughter     Type 1  . Breast cancer Sister   . Irritable bowel syndrome Mother   . Colon cancer Neg Hx     Review of Systems:  Constitutional: negative for anorexia, fevers and sweats Eyes: negative for irritation, redness and visual disturbance Ears, nose, mouth, throat, and face: negative for earaches, epistaxis, nasal congestion and sore throat Respiratory: negative for cough, dyspnea on exertion, sputum and wheezing Cardiovascular: negative for chest pain, dyspnea, lower extremity edema, orthopnea, palpitations and syncope Gastrointestinal: negative for abdominal pain, constipation, diarrhea, melena, nausea and vomiting Genitourinary:negative for dysuria, frequency and hematuria Hematologic/lymphatic: negative for bleeding, easy bruising and lymphadenopathy Musculoskeletal:negative for arthralgias, muscle weakness and stiff joints Neurological: negative for coordination  problems, gait problems, headaches and weakness Endocrine: negative for diabetic symptoms including polydipsia, polyuria and weight loss Allergic/Immunologic: negative for anaphylaxis, hay fever and urticaria  Physical Exam: BP 154/68  Pulse 59  Temp(Src) 97.5 F (36.4 C) (Oral)  Resp 16  Ht 5\' 2"  (1.575 m)  Wt 54.432 kg (120 lb)  BMI 21.95 kg/m2  SpO2 100% General appearance: alert, cooperative and no distress  Head: Normocephalic, without obvious abnormality,  atraumatic  Eyes: conjunctivae/corneas clear. PERRL, EOM's intact. Fundi benign.  Nose: Nares normal. Septum midline. Mucosa normal. No drainage or sinus tenderness.  Throat: lips, mucosa, and tongue normal; teeth and gums normal  Neck: Supple, no masses, no cervical lymphadenopathy, no JVD appreciated, no meningeal signs Resp: clear to auscultation bilaterally  Chest wall: no tenderness  Cardio: regular rate and rhythm, S1, S2 normal, no murmur, click, rub or gallop  GI: soft, non-tender; bowel sounds normal; no masses, no organomegaly  Extremities: extremities normal, atraumatic, no cyanosis or edema  Skin: Skin color, texture, turgor normal. No rashes or lesions  Neurologic: Alert and oriented X 3, normal strength and tone. Normal symmetric reflexes. Normal coordination and gait  Labs on Admission:   [facility] 04/17/11 0544  NA 142  K 3.6  CL 106  CO2 27  GLUCOSE 90  BUN 18  CREATININE 0.61  CALCIUM 9.4  MG --  PHOS --   Basename 04/17/11 0544  WBC 5.2  NEUTROABS 2.0  HGB 14.4  HCT 42.0  MCV 84.3  PLT 220    Basename 04/17/11 0544  CKTOTAL 49  CKMB 2.4  CKMBINDEX --  TROPONINI <0.30   Radiological Exams on Admission: Dg Chest 2 View  04/17/2011  *RADIOLOGY REPORT*  Clinical Data: Mid chest pain; hypertension.  CHEST - 2 VIEW  Comparison: Chest radiograph performed 02/28/2006  Findings: The lungs are well-aerated and clear.  There is no evidence of focal opacification, pleural effusion or pneumothorax.  The heart is normal in size; the mediastinal contour is within normal limits.  No acute osseous abnormalities are seen.  IMPRESSION: No acute cardiopulmonary process seen.  Original Report Authenticated By: Tonia Ghent, M.D.     IMPRESSION: Present on Admission:  .Chest pain .HYPERLIPIDEMIA .HYPERTENSION .IBS  Assessment/Plan  1. Chest pain: Patient will be admitted to the hospital for further evaluation. First set of cardiac enzymes and 12-lead EKG are  negative for ischemic changes. Will circulate 3 sets of cardiac enzymes repeat 12-lead EKG in the morning. I will get d-dimer also to rule out PE as patient has some worsening of pain with taking deep breath. She can likely have a stress test as outpatient if the basis of enzymes were negative because of the low risk.  2. Hyperlipidemia: Patient taking Lovaza continue that. Will check fasting lipid profile in the morning.  3. HTN: This as he is controlled patient on Cozaar we'll continue.   Marquest Gunkel A 04/17/2011, 8:22 AM

## 2011-04-18 ENCOUNTER — Telehealth: Payer: Self-pay

## 2011-04-18 LAB — BASIC METABOLIC PANEL
BUN: 20 mg/dL (ref 6–23)
Chloride: 106 mEq/L (ref 96–112)
Glucose, Bld: 87 mg/dL (ref 70–99)
Potassium: 4.2 mEq/L (ref 3.5–5.1)
Sodium: 140 mEq/L (ref 135–145)

## 2011-04-18 LAB — CARDIAC PANEL(CRET KIN+CKTOT+MB+TROPI)
CK, MB: 2 ng/mL (ref 0.3–4.0)
Troponin I: <0.3 ng/mL (ref <0.30)

## 2011-04-18 LAB — CBC
HCT: 41.1 % (ref 36.0–46.0)
Hemoglobin: 13.9 g/dL (ref 12.0–15.0)
MCH: 28.4 pg (ref 26.0–34.0)
MCHC: 33.8 g/dL (ref 30.0–36.0)
RBC: 4.89 MIL/uL (ref 3.87–5.11)

## 2011-04-18 LAB — LIPID PANEL
Cholesterol: 211 mg/dL — ABNORMAL HIGH (ref 0–200)
LDL Cholesterol: 138 mg/dL — ABNORMAL HIGH (ref 0–99)
Total CHOL/HDL Ratio: 4.1 RATIO
Triglycerides: 112 mg/dL (ref <150)
VLDL: 22 mg/dL (ref 0–40)

## 2011-04-18 NOTE — Discharge Summary (Signed)
HOSPITAL DISCHARGE SUMMARY  Joan Welch  MRN: [id number]  DOB:[birth year]  Date of Admission: 04/17/2011 Date of Discharge: 04/18/2011         LOS: 1 day   Attending Physician:Ramatoulaye Pack A  Patient's HQI:ONGEXBM Joan Coyer, MD, MD  Consults:   Home  Discharge Diagnoses: Present on Admission:  .Chest pain .HYPERLIPIDEMIA .HYPERTENSION .IBS   Current Discharge Medication List    CONTINUE these medications which have NOT CHANGED   Details  latanoprost (XALATAN) 0.005 % ophthalmic solution Place 1 drop into both eyes at bedtime.    losartan (COZAAR) 50 MG tablet Take 1 tablet (50 mg total) by mouth daily. Qty: 30 tablet, Refills: 3    omega-3 acid ethyl esters (LOVAZA) 1 G capsule Take 1 g by mouth daily.    polyethylene glycol powder (GLYCOLAX/MIRALAX) powder Dissolve 1/2 capful (up to 2 capfuls) in at least 8 ounces water/juice and drink once daily as needed Qty: 255 g, Refills: 0    Probiotic Product (PROBIOTIC PO) Take by mouth. Will take one by mouth once daily and some days will take two          Brief Admission History: Joan Welch is a 71 y.o. female with history of hypertension and hyperlipidemia. Patient given to the hospital complaining about chest pain. Patient woke up this morning about 3:45 and she experienced substernal chest pain. The pain radiated to be 6/10, crushing and does not radiate. She came into the emergency department for further evaluation. She was given nitroglycerin and she saw the pain is better a little bit. Also when she was asked to take deep breath she thought that made the pain worse. Patient says she exercise for an hour every other day and now she hikes about 5 miles every week. Patient will be placed in observation to rule out acute coronary syndrome.  Hospital Course: Present on Admission:  .Chest pain .HYPERLIPIDEMIA .HYPERTENSION .IBS  1. Chest pain: Patient admitted to the hospital because of chest pain. The chest  pain was substernal patient said it still was crushing and it got somewhat better with nitroglycerin. Patient of admission acute coronary syndrome was ruled out with 3 sets of cardiac enzymes. Patient also is negative EKG. Patient also does not mention any exertional chest pain like classic angina. She runs at the gym everyday and she hikes about 5 miles miles every week and she never had chest pain with these exercises. Also she does have negative chest x-ray. Negative D. Dimers.  2. Hyperlipidemia: Please see the lipid profile below. Patient said she had problems before with statins namely Lipitor which causes muscle aches. Patient to followup with her primary care physician for further management.  3. HTN: This is being controlled. With a sporadic readings of systolic blood pressure above 841. Her Cozaar was continued.   Day of Discharge BP 151/75  Pulse 57  Temp(Src) 97.5 F (36.4 C) (Oral)  Resp 18  Ht 5\' 2"  (1.575 m)  Wt 53.751 kg (118 lb 8 oz)  BMI 21.67 kg/m2  SpO2 98% Physical Exam: GEN: No acute distress, cooperative with exam PSYCH: He is alert and oriented x4; does not appear anxious does not appear depressed; affect is normal  HEENT: Mucous membranes pink and anicteric;  Mouth: without oral thrush or lesions Eyes: PERRLA; EOM intact;  Neck: no cervical lymphadenopathy nor thyromegaly or carotid bruit; no JVD;  CHEST WALL: No tenderness, symmetrical to breathing bilaterally CHEST: Normal respiration, clear to auscultation bilaterally  HEART: Regular rate  and rhythm; no murmurs, rubs or gallops, S1 and S2 heard  BACK: No kyphosis or scoliosis; no CVA tenderness  ABDOMEN:  soft non-tender; no masses, no organomegaly, normal abdominal bowel sounds; no pannus; no intertriginous candida.  EXTREMITIES: No bone or joint deformity; no edema; no ulcerations.  PULSES: 2+ and symmetric, neurovascularity is intact SKIN: Normal hydration no rash or ulceration, no flushing or suspicious  lesions  CNS: Cranial nerves 2-12 grossly intact no focal neurologic deficit, coordination is intact gait not tested    Results for orders placed during the hospital encounter of 04/17/11 (from the past 24 hour(s))  TSH     Status: Normal   Collection Time   04/17/11 10:32 AM      Component Value Range   TSH 1.178  0.350 - 4.500 (uIU/mL)  HEMOGLOBIN A1C     Status: Normal   Collection Time   04/17/11 10:32 AM      Component Value Range   Hemoglobin A1C 5.4  <5.7 (%)   Mean Plasma Glucose 108  <117 (mg/dL)  D-DIMER, QUANTITATIVE     Status: Normal   Collection Time   04/17/11 10:32 AM      Component Value Range   D-Dimer, Quant <0.22  0.00 - 0.48 (ug/mL-FEU)  CARDIAC PANEL(CRET KIN+CKTOT+MB+TROPI)     Status: Normal   Collection Time   04/17/11  2:42 PM      Component Value Range   Total CK 38  7 - 177 (U/L)   CK, MB 2.1  0.3 - 4.0 (ng/mL)   Troponin I <0.30  <0.30 (ng/mL)   Relative Index RELATIVE INDEX IS INVALID  0.0 - 2.5   CARDIAC PANEL(CRET KIN+CKTOT+MB+TROPI)     Status: Normal   Collection Time   04/17/11  9:50 PM      Component Value Range   Total CK 35  7 - 177 (U/L)   CK, MB 2.0  0.3 - 4.0 (ng/mL)   Troponin I <0.30  <0.30 (ng/mL)   Relative Index RELATIVE INDEX IS INVALID  0.0 - 2.5   BASIC METABOLIC PANEL     Status: Abnormal   Collection Time   04/18/11  6:17 AM      Component Value Range   Sodium 140  135 - 145 (mEq/L)   Potassium 4.2  3.5 - 5.1 (mEq/L)   Chloride 106  96 - 112 (mEq/L)   CO2 27  19 - 32 (mEq/L)   Glucose, Bld 87  70 - 99 (mg/dL)   BUN 20  6 - 23 (mg/dL)   Creatinine, Ser 1.61  0.50 - 1.10 (mg/dL)   Calcium 9.3  8.4 - 09.6 (mg/dL)   GFR calc non Af Amer 89 (*) >90 (mL/min)   GFR calc Af Amer >90  >90 (mL/min)  CBC     Status: Normal   Collection Time   04/18/11  6:17 AM      Component Value Range   WBC 4.3  4.0 - 10.5 (K/uL)   RBC 4.89  3.87 - 5.11 (MIL/uL)   Hemoglobin 13.9  12.0 - 15.0 (g/dL)   HCT 04.5  40.9 - 81.1 (%)   MCV 84.0   78.0 - 100.0 (fL)   MCH 28.4  26.0 - 34.0 (pg)   MCHC 33.8  30.0 - 36.0 (g/dL)   RDW 91.4  78.2 - 95.6 (%)   Platelets 221  150 - 400 (K/uL)  LIPID PANEL     Status: Abnormal   Collection Time  04/18/11  6:17 AM      Component Value Range   Cholesterol 211 (*) 0 - 200 (mg/dL)   Triglycerides 161  <096 (mg/dL)   HDL 51  >04 (mg/dL)   Total CHOL/HDL Ratio 4.1     VLDL 22  0 - 40 (mg/dL)   LDL Cholesterol 540 (*) 0 - 99 (mg/dL)  CARDIAC PANEL(CRET KIN+CKTOT+MB+TROPI)     Status: Normal   Collection Time   04/18/11  6:17 AM      Component Value Range   Total CK 33  7 - 177 (U/L)   CK, MB 2.0  0.3 - 4.0 (ng/mL)   Troponin I <0.30  <0.30 (ng/mL)   Relative Index RELATIVE INDEX IS INVALID  0.0 - 2.5     Disposition: Home   Follow-up Appts: Discharge Orders    Future Orders Please Complete By Expires   Diet - low sodium heart healthy      Increase activity slowly         Follow-up Information    Follow up with Rene Paci, MD. Schedule an appointment as soon as possible for a visit in 1 week.         I spent 40 minutes completing paperwork and coordinating discharge efforts.  SignedClydia Llano A 04/18/2011, 9:38 AM

## 2011-04-18 NOTE — Telephone Encounter (Signed)
Pt called stating she was seen at [facility] ED for chest pain and later released. Pt is requesting MD's advisement, should she schedule F/U with VAL or be referred to Cardiology, please advise.

## 2011-04-18 NOTE — Progress Notes (Signed)
UR completed 

## 2011-04-19 NOTE — Telephone Encounter (Signed)
i will be happy to see pt - thanks

## 2011-04-29 ENCOUNTER — Encounter: Payer: Self-pay | Admitting: Internal Medicine

## 2011-04-29 ENCOUNTER — Ambulatory Visit (INDEPENDENT_AMBULATORY_CARE_PROVIDER_SITE_OTHER): Payer: Medicare Other | Admitting: Internal Medicine

## 2011-04-29 DIAGNOSIS — R079 Chest pain, unspecified: Secondary | ICD-10-CM

## 2011-04-29 DIAGNOSIS — I10 Essential (primary) hypertension: Secondary | ICD-10-CM

## 2011-04-29 MED ORDER — LOSARTAN POTASSIUM 100 MG PO TABS: 100.0000 mg | ORAL_TABLET | Freq: Every day | ORAL | Status: DC

## 2011-04-29 MED ORDER — OMEPRAZOLE 20 MG PO CPDR: 20.0000 mg | DELAYED_RELEASE_CAPSULE | Freq: Every day | ORAL | Status: DC

## 2011-04-29 NOTE — Assessment & Plan Note (Signed)
Improved on low dose ARB - but increase now as not at goal - The current medical regimen is effective;  continue present plan and medications. BP Readings from Last 3 Encounters:  04/29/11 148/82  04/18/11 151/75  03/16/11 122/80

## 2011-04-29 NOTE — Assessment & Plan Note (Signed)
Overnight observation for chest pain review> negative telemetry, negative serial enzymes negative d-dimer Risk factors for coronary disease reviewed. Will refer for stress test to exclude ischemic possibility Also recommend 30 day trial of omeprazole

## 2011-04-29 NOTE — Patient Instructions (Signed)
It was good to see you today. We have reviewed your prior records including labs and tests today we'll make referral for cardiac stress test . Our office will contact you regarding appointment(s) once made. 30 day trial omeprazole as discussed Increase losartan to 100 mg daily Followup in 2-3 weeks after stress test to review results and next steps

## 2011-05-01 NOTE — Progress Notes (Signed)
Subjective:    Patient ID: Joan Welch, female    DOB: 1941-01-04, 71 y.o.   MRN: [id number]  HPI  Here for ER/obs hosp follow up - [facility] Overnight obs for r/o MI and tele unremarkable - No recurrent CP since that episode Exercises 30-40 min >5x/wk on treadmill without chest pain  No GERD, no abdominal pain or nausea No shortness of breath or cough No radiation of pain when present (awoke from sleep prior to ER eval) spont resolution prior to ER arrival  Also reviewed chronic medical issues:  HTN  - SBP 110-150s  (home BP log reviewed) working on low Na diet and regular aerobic activity Started on ARB tx 05/2010 for same, titrated up summer 2012 - tolerating medication well  denies palpitations, shortness of breath or fluttering sensation  Urinary incontinence, mixed Works with urology for same Various antispasmodics and anticholinergics tried without relief Working on physical therapy and biofeedback for same since 2011  Chronic episodic BPPV -dizzy symptoms - onset summer 2011 while living in [place] - dx by provider there with same -  symptoms unimproved with self manuevers and meclizine no headache, no hearing change or ringing - no vision change also associated with nausea but no falls Participated in OP vestib rehab eval and ENT eval 04/2010 - no current symptoms at this time  IBS, constipation predominate - long hx same - worse since retirement 10/2009 - Colonoscopy with GI evaluation fall 2012> adenomatous polyp but otherwise unremarkable Continued episodic flares  2-3x/week - "but manageable" associated with bloating and nausea but no vomitting -  +lower abd discomfort but not painful overwhelming fatigue during flare improved with probiotic and gas x - takes as needed  dyslipidemia -prev on lipitor for same -self dc'd med 10/2009 due to muscle fatigue which has improved off med - follows low fat diet and regular exercise  OA, CMC arthritis B thumbs - uses Aleve as  needed - follows with ortho at [facility] for same -   Past Medical History  Diagnosis Date  . Glaucoma   . IBS (irritable bowel syndrome)   . H. pylori infection   . Headache   . Hyperlipidemia   . Hypertension   . Urinary incontinence     oab  . Arthritis     Review of Systems  Constitutional: Negative for fever and fatigue.  Respiratory: Negative for cough and shortness of breath.   Cardiovascular: Negative for chest pain, palpitations and leg swelling.  Neurological: Negative for dizziness and headaches.       Objective:   Physical Exam  BP 148/82  Pulse 64  Temp(Src) 97 F (36.1 C) (Oral)  Wt 119 lb 1.9 oz (54.032 kg)  SpO2 97%  Wt Readings from Last 3 Encounters:  04/29/11 119 lb 1.9 oz (54.032 kg)  04/18/11 118 lb 8 oz (53.751 kg)  03/16/11 119 lb (53.978 kg)   Constitutional: She appears well-developed and well-nourished. No distress. Chest wall: nontender to palpation over costo-sternal margins Cardiovascular: Normal rate, regular rhythm and normal heart sounds.  No murmur heard. No BLE edema. Pulmonary/Chest: Effort normal and breath sounds normal. No respiratory distress. She has no wheezes.  Psychiatric: She has a normal mood and affect. Her behavior is normal. Judgment and thought content normal.       Lab Results  Component Value Date   WBC 4.3 04/18/2011   HGB 13.9 04/18/2011   HCT 41.1 04/18/2011   PLT 221 04/18/2011   CHOL 211* 04/18/2011  TRIG 112 04/18/2011   HDL 51 04/18/2011   LDLDIRECT 168.9 02/09/2010   ALT 19 11/15/2010   AST 28 11/15/2010   NA 140 04/18/2011   K 4.2 04/18/2011   CL 106 04/18/2011   CREATININE 0.62 04/18/2011   BUN 20 04/18/2011   CO2 27 04/18/2011   TSH 1.178 04/17/2011   HGBA1C 5.4 04/17/2011     Assessment & Plan:  See problem list. Medications reviewed today.

## 2011-05-04 ENCOUNTER — Other Ambulatory Visit: Payer: Self-pay | Admitting: Internal Medicine

## 2011-05-12 ENCOUNTER — Ambulatory Visit (HOSPITAL_COMMUNITY): Payer: Medicare Other | Attending: Internal Medicine | Admitting: Radiology

## 2011-05-12 DIAGNOSIS — E785 Hyperlipidemia, unspecified: Secondary | ICD-10-CM | POA: Insufficient documentation

## 2011-05-12 DIAGNOSIS — R079 Chest pain, unspecified: Secondary | ICD-10-CM | POA: Insufficient documentation

## 2011-05-12 DIAGNOSIS — I10 Essential (primary) hypertension: Secondary | ICD-10-CM | POA: Insufficient documentation

## 2011-05-12 MED ORDER — TECHNETIUM TC 99M TETROFOSMIN IV KIT
30.0000 | PACK | Freq: Once | INTRAVENOUS | Status: AC | PRN
  Administered 2011-05-12: 30 via INTRAVENOUS

## 2011-05-12 MED ORDER — TECHNETIUM TC 99M TETROFOSMIN IV KIT
10.0000 | PACK | Freq: Once | INTRAVENOUS | Status: AC | PRN
  Administered 2011-05-12: 10 via INTRAVENOUS

## 2011-05-12 NOTE — Progress Notes (Signed)
[facility] SITE 3 NUCLEAR MED [address] [place] [address] [phone]  Cardiology Nuclear Med Study  Joan Welch is a 71 y.o. female [id number] October 14, 1940   Nuclear Med Background Indication for Stress Test:  Evaluation for Ischemia, [facility] ED 01/13 substernal CP with (-) enzymes/telemetry History: no prior cardiac Hx Cardiac Risk Factors: Hypertension and Lipids  Symptoms:  Chest Pain    Nuclear Pre-Procedure Caffeine/Decaff Intake:  None NPO After: 6 pm   Lungs:  clear IV 0.9% NS with Angio Cath:  20g  IV Site: R Antecubital  IV Started by:  Milana Na, EMT-P  Chest Size (in):  34 Cup Size: B  Height: 5\' 2"  (1.575 m)  Weight:  119 lb (53.978 kg)  BMI:  Body mass index is 21.77 kg/(m^2). Tech Comments: n/a    Nuclear Med Study 1 or 2 day study: 1 day  Stress Test Type:  Stress  Reading MD: Charlton Haws, MD  Order Authorizing Provider: Rene Paci MD  Resting Radionuclide: Technetium 51m Tetrofosmin  Resting Radionuclide Dose: 11.0 mCi   Stress Radionuclide:  Technetium 48m Tetrofosmin  Stress Radionuclide Dose: 33.0 mCi           Stress Protocol Rest HR: 57 Stress HR: 142  Rest BP: 141/72 Stress BP: 213/75  Exercise Time (min): 8:59 METS: 10.10   Predicted Max HR: 150 bpm % Max HR: 94.67 bpm Rate Pressure Product: 21308   Dose of Adenosine (mg):  n/a Dose of Lexiscan: n/a mg  Dose of Atropine (mg): n/a Dose of Dobutamine: n/a mcg/kg/min (at max HR)  Stress Test Technologist: Milana Na, EMT-P  Nuclear Technologist:  Domenic Polite, CNMT     Rest Procedure:  Myocardial perfusion imaging was performed at rest 45 minutes following the intravenous administration of Technetium 70m Tetrofosmin. Rest ECG: NSR  Stress Procedure:  The patient exercised for 8:59.  The patient stopped due to fatigue and denied any chest pain.  There were non specific ST-T wave changes.  Technetium 58m Tetrofosmin was  injected at peak exercise and myocardial perfusion imaging was performed after a brief delay. Stress ECG: Twave inversions in recovery  QPS Raw Data Images:  Normal; no motion artifact; normal heart/lung ratio. Stress Images:  Normal homogeneous uptake in all areas of the myocardium. Rest Images:  Normal homogeneous uptake in all areas of the myocardium. Subtraction (SDS):  Normal Transient Ischemic Dilatation (Normal <1.22):  1.04 Lung/Heart Ratio (Normal <0.45):  0.34  Quantitative Gated Spect Images QGS EDV:  72 ml QGS ESV:  25 ml QGS cine images:  NL LV Function; NL Wall Motion QGS EF: 65%  Impression Exercise Capacity:  Good exercise capacity. BP Response:  Hypertensive blood pressure response. Clinical Symptoms:  No chest pain. ECG Impression:  T wave inversions in recovery Comparison with Prior Nuclear Study: No images to compare  Overall Impression:  Low risk stress nuclear study. Normal nuclear images.  Nondiagnostic ECG due to baseline changes.  Hypertensive response to exercise   Charlton Haws

## 2011-05-19 ENCOUNTER — Telehealth: Payer: Self-pay

## 2011-05-19 NOTE — Telephone Encounter (Signed)
Okay to hold for one week, office visit if symptoms unimproved or worse. Thanks

## 2011-05-19 NOTE — Telephone Encounter (Signed)
Pt called stating that Losartan is causing dizziness. Pt is requesting to HOLD medication to see if this is the cause of her sxs, please advise.

## 2011-05-19 NOTE — Telephone Encounter (Signed)
Pt advised via VM 

## 2011-05-30 ENCOUNTER — Ambulatory Visit: Payer: Medicare Other | Admitting: Internal Medicine

## 2011-06-02 ENCOUNTER — Encounter: Payer: Self-pay | Admitting: Internal Medicine

## 2011-06-02 ENCOUNTER — Ambulatory Visit (INDEPENDENT_AMBULATORY_CARE_PROVIDER_SITE_OTHER): Payer: Medicare Other | Admitting: Internal Medicine

## 2011-06-02 ENCOUNTER — Telehealth: Payer: Self-pay | Admitting: Internal Medicine

## 2011-06-02 DIAGNOSIS — R079 Chest pain, unspecified: Secondary | ICD-10-CM

## 2011-06-02 DIAGNOSIS — I10 Essential (primary) hypertension: Secondary | ICD-10-CM

## 2011-06-02 MED ORDER — HYDROCHLOROTHIAZIDE 12.5 MG PO TABS: 12.5000 mg | ORAL_TABLET | Freq: Every day | ORAL | Status: DC

## 2011-06-02 NOTE — Assessment & Plan Note (Signed)
Improved on low dose ARB -but higher dose exacerbated BPPV dizzy symptoms - Off ARB x 2 weeks - dizzy improved but BP up (reviewed home log) Start HCTZ 12.5 mg qd - titrate as needed - erx done  BP Readings from Last 3 Encounters:  06/02/11 130/82  05/12/11 112/60  04/29/11 148/82

## 2011-06-02 NOTE — Patient Instructions (Signed)
It was good to see you today. We have reviewed your recent cardiac stress - this was normal - copy given to you today Stop losartan and start HCTZ diuretic therapy for blood pressure - Your prescription(s) have been submitted to your pharmacy. Please take as directed and contact our office if you believe you are having problem(s) with the medication(s). Ok to increase HCTZ to 25mg  daily if needed for blood pressure control - call and let me know if you have done this so i can increase dose on next prescription

## 2011-06-02 NOTE — Progress Notes (Signed)
Subjective:    Patient ID: Joan Welch, female    DOB: 07-02-1940, 71 y.o.   MRN: [id number]  HPI  Here for follow up - reviewed chronic medical issues  chest pain - episode of SSCP 04/2011 prompting ER eval Overnight obs for r/o MI and tele unremarkable - Lexiscan 05/2011: low risk, no perfusion deficits No recurrent CP since, s/p 2 weeks PPI  HTN  - SBP 110-150s  (home BP log reviewed) working on low Na diet and regular aerobic activity Started on ARB tx 05/2010 for same, titrated up summer 2012 - increased dose exacerbated chronic dizziness (see BPPV below ) - stopped ARB x 2 weeks - BP up but less dizzy denies palpitations, shortness of breath or fluttering sensation  Urinary incontinence, mixed Works with urology for same Various antispasmodics and anticholinergics tried without relief Working on physical therapy and biofeedback for same since 2011  Chronic episodic BPPV -dizzy symptoms - onset summer 2011 while living in [place] - dx by provider there with same -  symptoms unimproved with self manuevers and meclizine no headache, no hearing change or ringing - no vision change also associated with nausea but no falls Participated in OP vestib rehab eval and ENT eval 04/2010 - no current symptoms at this time  IBS, constipation predominate - long hx same - worse since retirement 10/2009 - Colonoscopy with GI evaluation fall 2012> adenomatous polyp but otherwise unremarkable Continued episodic flares  2-3x/week - "but manageable" associated with bloating and nausea but no vomitting -  +lower abd discomfort but not painful overwhelming fatigue during flare improved with probiotic and gas x - takes as needed  dyslipidemia - prev on lipitor for same -self dc'd med 10/2009 due to muscle fatigue which has improved off med - follows low fat diet and regular exercise  OA, CMC arthritis B thumbs - uses Aleve as needed - follows with ortho at [facility] for same - also seeing hand specialist  for shoulder pain and problems  Past Medical History  Diagnosis Date  . Glaucoma   . IBS (irritable bowel syndrome)   . H. pylori infection   . Headache   . Hyperlipidemia   . Hypertension   . Urinary incontinence     oab  . Arthritis     Review of Systems  Constitutional: Negative for fever and fatigue.  Respiratory: Negative for cough and shortness of breath.   Cardiovascular: Negative for chest pain, palpitations and leg swelling.  Neurological: Negative for dizziness and headaches.       Objective:   Physical Exam  BP 130/82  Pulse 60  Temp(Src) 97.4 F (36.3 C) (Oral)  Ht 5\' 4"  (1.626 m)  Wt 122 lb 1.9 oz (55.393 kg)  BMI 20.96 kg/m2  SpO2 97%  Wt Readings from Last 3 Encounters:  06/02/11 122 lb 1.9 oz (55.393 kg)  05/12/11 119 lb (53.978 kg)  04/29/11 119 lb 1.9 oz (54.032 kg)   Constitutional: She appears well-developed and well-nourished. No distress. Cardiovascular: Normal rate, regular rhythm and normal heart sounds.  No murmur heard. No BLE edema. Pulmonary/Chest: Effort normal and breath sounds normal. No respiratory distress. She has no wheezes.  Psychiatric: She has a normal mood and affect. Her behavior is normal. Judgment and thought content normal.      Lab Results  Component Value Date   WBC 4.3 04/18/2011   HGB 13.9 04/18/2011   HCT 41.1 04/18/2011   PLT 221 04/18/2011   CHOL 211* 04/18/2011  TRIG 112 04/18/2011   HDL 51 04/18/2011   LDLDIRECT 168.9 02/09/2010   ALT 19 11/15/2010   AST 28 11/15/2010   NA 140 04/18/2011   K 4.2 04/18/2011   CL 106 04/18/2011   CREATININE 0.62 04/18/2011   BUN 20 04/18/2011   CO2 27 04/18/2011   TSH 1.178 04/17/2011   HGBA1C 5.4 04/17/2011     Assessment & Plan:  See problem list. Medications reviewed today.

## 2011-06-02 NOTE — Telephone Encounter (Signed)
Sending two pages

## 2011-06-02 NOTE — Assessment & Plan Note (Signed)
Noncardiac - reviewed low risk, normal perfusion Lexiscan 05/2011 symptoms resolved at this time S/p PPI x 2 weeks - ok to use prn

## 2011-06-03 ENCOUNTER — Telehealth: Payer: Self-pay | Admitting: Internal Medicine

## 2011-06-03 NOTE — Telephone Encounter (Signed)
RECEIVED TWO PAGES.

## 2011-07-25 ENCOUNTER — Telehealth: Payer: Self-pay

## 2011-07-25 NOTE — Telephone Encounter (Signed)
A user error has taken place: encounter opened in error, closed for administrative reasons.

## 2011-07-27 ENCOUNTER — Telehealth: Payer: Self-pay | Admitting: Internal Medicine

## 2011-07-27 DIAGNOSIS — I10 Essential (primary) hypertension: Secondary | ICD-10-CM

## 2011-07-27 NOTE — Telephone Encounter (Signed)
Letter from pt -  I reviewed SBP 120-160 range on HCTZ alone Will rbrandesume low dose losartan 25mg  qd WITH current diuretic  New rx for losartan done - send if pt agrees to same OV to review in 4-6 weeks thanks

## 2011-07-28 NOTE — Telephone Encounter (Signed)
Notified pt with md response & recommendations. Pt states she can not take the losartan. Med makes her dizzy and weak. She feel much better w/o taking the losartan... 07/28/11@4 :24pm/LMB

## 2011-07-28 NOTE — Telephone Encounter (Signed)
Per my notes recalling our conversation, it was the high dose losartan that caused dizzy and weakness - she had done well with low dose losartan, but had not seen BP well controlled on low dose losartan- i feel combination is reasonable trial, but i will respect pt descion to decline same   Let pt know i will refer to cards for assistance with her BP med mgmt  No additional changes recommended by me at this time

## 2011-07-28 NOTE — Telephone Encounter (Signed)
Called pt no answer LMOM RTC... 07/28/11@10 :47am/LMB

## 2011-07-29 NOTE — Telephone Encounter (Signed)
Pt advised and expressed understanding.

## 2011-08-01 ENCOUNTER — Telehealth: Payer: Self-pay | Admitting: Internal Medicine

## 2011-08-01 NOTE — Telephone Encounter (Signed)
Received copies from [facility] ,on 08/01/11. Forwarded 7 pages to [employer] ,for review.

## 2011-08-18 ENCOUNTER — Encounter: Payer: Self-pay | Admitting: Cardiology

## 2011-08-18 ENCOUNTER — Ambulatory Visit (INDEPENDENT_AMBULATORY_CARE_PROVIDER_SITE_OTHER): Payer: Medicare Other | Admitting: Cardiology

## 2011-08-18 VITALS — BP 154/71 | HR 64 | Ht 61.0 in | Wt 119.0 lb

## 2011-08-18 DIAGNOSIS — R079 Chest pain, unspecified: Secondary | ICD-10-CM

## 2011-08-18 DIAGNOSIS — I10 Essential (primary) hypertension: Secondary | ICD-10-CM

## 2011-08-18 DIAGNOSIS — E785 Hyperlipidemia, unspecified: Secondary | ICD-10-CM

## 2011-08-18 MED ORDER — LOSARTAN POTASSIUM-HCTZ 50-12.5 MG PO TABS: 1.0000 | ORAL_TABLET | Freq: Every day | ORAL | Status: DC

## 2011-08-18 NOTE — Assessment & Plan Note (Signed)
Patient presents for evaluation of hypertension. She has primarily isolated systolic hypertension. Her systolic remains mildly elevated despite HCTZ 25 mg daily. We reviewed lifestyle modification. She does not smoke. She only occasionally consumes alcohol. She is not overweight. She has implemented a low-sodium diet. She exercises routinely. I have explained that hypertension sometimes is better controlled with 2 classes of medications at moderate dose compared to one class at higher dose. We will therefore discontinue her present dose of HCTZ and instead treat with Hyzaar 50/12.5 mg by mouth daily. We will adjust based on followup readings. Check potassium and renal function in one week.

## 2011-08-18 NOTE — Patient Instructions (Signed)
Your physician recommends that you schedule a follow-up appointment in: 8 WEEKS WITH DR CRENSHAW  STOP HCTZ  START LOSARTAN/HCTZ 50-12.5 MG ONCE DAILY  Your physician recommends that you return for lab work in: ONE WEEK

## 2011-08-18 NOTE — Assessment & Plan Note (Signed)
Management per primary care. 

## 2011-08-18 NOTE — Progress Notes (Signed)
HPI: 71 year old female for evaluation of hypertension. Patient did have a nuclear study performed in February of 2013 and showed an ejection fraction of 65% and normal perfusion. TSH was normal in January of 2013 at 1.178. Sodium, potassium and renal function normal in January of 2013. Patient denies dyspnea on exertion, orthopnea, PND, pedal edema, palpitations, syncope or exertional chest pain. She follows her blood pressure closely at home. She has been on HCTZ 25 mg daily since February 2013. Her systolic blood pressure typically runs between 130 to 140 and occasionally 145 to 150. She therefore presents for further evaluation.  Current Outpatient Prescriptions  Medication Sig Dispense Refill  . Artificial Tear Solution (BION TEARS) 0.1-0.3 % SOLN Apply to eye as directed.      . hydrochlorothiazide (HYDRODIURIL) 25 MG tablet Take 25 mg by mouth daily.      Marland Kitchen latanoprost (XALATAN) 0.005 % ophthalmic solution Place 1 drop into both eyes at bedtime.      . Probiotic Product (PROBIOTIC PO) Take by mouth. Will take one by mouth once daily and some days will take two       . Tafluprost (ZIOPTAN) 0.0015 % SOLN Apply to eye as directed.        Allergies  Allergen Reactions  . Caffeine Nausea Only    Extreme nervousness     Past Medical History  Diagnosis Date  . Glaucoma   . IBS (irritable bowel syndrome)   . H. pylori infection   . Headache   . Hyperlipidemia   . Hypertension   . Urinary incontinence     oab  . Arthritis     Past Surgical History  Procedure Date  . Bladder suspension in 1986 & 1987  . Abdominal hysterectomy 1998  . Breast biopsy 1997  . Colonoscopy     History   Social History  . Marital Status: Divorced    Spouse Name: N/A    Number of Children: 3  . Years of Education: N/A   Occupational History  . Retired    Social History Main Topics  . Smoking status: Never Smoker   . 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 divorced, lives alone  . Alcohol Use: Yes     Social  . Drug Use: No  . Sexually Active: Not on file   Other Topics Concern  . Not on file   Social History Narrative  . No narrative on file    Family History  Problem Relation Age of Onset  . Diabetes Daughter     Type 1  . Breast cancer Sister   . Irritable bowel syndrome Mother   . Colon cancer Neg Hx   . Hypertension Sister     ROS: Occasional hand arthralgias and IBS but no fevers or chills, productive cough, hemoptysis, dysphasia, odynophagia, melena, hematochezia, dysuria, hematuria, rash, seizure activity, orthopnea, PND, pedal edema, claudication. Remaining systems are negative.  Physical Exam:   Blood pressure 154/71, pulse 64, height 5\' 1"  (1.549 m), weight 53.978 kg (119 lb).  General:  Well developed/well nourished in NAD Skin warm/dry Patient not depressed No peripheral clubbing Back-normal HEENT-normal/normal eyelids Neck supple/normal carotid upstroke bilaterally; no bruits; no JVD; no thyromegaly chest - CTA/ normal expansion CV - RRR/normal S1 and S2; no murmurs, rubs or gallops;  PMI nondisplaced Abdomen -NT/ND, no HSM, no mass, + bowel  sounds, no bruit 2+ femoral pulses, no bruits Ext-no edema, chords, 2+ DP Neuro-grossly nonfocal  ECG normal sinus rhythm at a rate of 64. Axis normal. Nonspecific ST changes.

## 2011-08-18 NOTE — Assessment & Plan Note (Signed)
Previous episode of chest pain. Myoview negative. No recurrent symptoms and no exertional chest pain. No further evaluation.

## 2011-08-24 ENCOUNTER — Other Ambulatory Visit: Payer: Self-pay

## 2011-08-26 ENCOUNTER — Other Ambulatory Visit (INDEPENDENT_AMBULATORY_CARE_PROVIDER_SITE_OTHER): Payer: Medicare Other

## 2011-08-26 DIAGNOSIS — I10 Essential (primary) hypertension: Secondary | ICD-10-CM

## 2011-08-26 LAB — BASIC METABOLIC PANEL
BUN: 21 mg/dL (ref 6–23)
GFR: 109.09 mL/min (ref >60.00)
Potassium: 3.7 mEq/L (ref 3.5–5.1)

## 2011-08-31 ENCOUNTER — Ambulatory Visit: Payer: Medicare Other | Admitting: Internal Medicine

## 2011-09-07 ENCOUNTER — Ambulatory Visit: Payer: Medicare Other | Admitting: Internal Medicine

## 2011-09-21 ENCOUNTER — Telehealth: Payer: Self-pay | Admitting: Internal Medicine

## 2011-09-21 NOTE — Telephone Encounter (Signed)
Caller: Joan Welch/Patient; PCP: Rene Paci; CB#: [phone]; ; ; Call regarding Wants To Know How Dr. Haynes Bast About Pt Being Vaccinated for Shingles At Her Age.; MEssage sent in EPIC to Dr. [name].

## 2011-09-21 NOTE — Telephone Encounter (Signed)
Yes, should receive Zostavax if available/affordable, esp at her age as there is increased risk of shingles outbreak with increasing age -

## 2011-09-22 NOTE — Telephone Encounter (Signed)
Pt advised and will contact insurance company regarding coverage and call to schedule nurse visit if covered.

## 2011-09-23 ENCOUNTER — Ambulatory Visit (INDEPENDENT_AMBULATORY_CARE_PROVIDER_SITE_OTHER): Payer: Medicare Other

## 2011-09-23 DIAGNOSIS — Z2911 Encounter for prophylactic immunotherapy for respiratory syncytial virus (RSV): Secondary | ICD-10-CM

## 2011-09-23 DIAGNOSIS — Z23 Encounter for immunization: Secondary | ICD-10-CM

## 2011-10-13 ENCOUNTER — Ambulatory Visit: Payer: Medicare Other | Admitting: Cardiology

## 2011-11-01 ENCOUNTER — Other Ambulatory Visit: Payer: Self-pay | Admitting: Internal Medicine

## 2011-11-01 DIAGNOSIS — Z1231 Encounter for screening mammogram for malignant neoplasm of breast: Secondary | ICD-10-CM

## 2011-11-24 ENCOUNTER — Ambulatory Visit (INDEPENDENT_AMBULATORY_CARE_PROVIDER_SITE_OTHER): Payer: Medicare Other | Admitting: Cardiology

## 2011-11-24 ENCOUNTER — Encounter: Payer: Self-pay | Admitting: Cardiology

## 2011-11-24 VITALS — BP 150/70 | HR 60 | Ht 62.0 in | Wt 118.0 lb

## 2011-11-24 DIAGNOSIS — R079 Chest pain, unspecified: Secondary | ICD-10-CM

## 2011-11-24 DIAGNOSIS — I10 Essential (primary) hypertension: Secondary | ICD-10-CM

## 2011-11-24 DIAGNOSIS — E785 Hyperlipidemia, unspecified: Secondary | ICD-10-CM

## 2011-11-24 NOTE — Assessment & Plan Note (Signed)
Management per primary care. 

## 2011-11-24 NOTE — Progress Notes (Signed)
   HPI: 71 year old female for fu of hypertension. Patient did have a nuclear study performed in February of 2013 and showed an ejection fraction of 65% and normal perfusion. TSH was normal in January of 2013 at 1.178. Sodium, potassium and renal function normal in January of 2013. When I last saw her in May of 2013, we changed her HCTZ to losartan HCT. Since then, the patient denies any dyspnea on exertion, orthopnea, PND, pedal edema, palpitations, syncope or chest pain.    Current Outpatient Prescriptions  Medication Sig Dispense Refill  . Artificial Tear Solution (BION TEARS) 0.1-0.3 % SOLN Apply to eye as directed.      . darifenacin (ENABLEX) 7.5 MG 24 hr tablet Take 7.5 mg by mouth daily. prn      . latanoprost (XALATAN) 0.005 % ophthalmic solution Place 1 drop into both eyes at bedtime.      Marland Kitchen losartan-hydrochlorothiazide (HYZAAR) 50-12.5 MG per tablet Take 1 tablet by mouth daily.  30 tablet  12  . Naproxen Sodium (ALEVE PO) Take by mouth.      Marland Kitchen omeprazole (PRILOSEC) 20 MG capsule Take 20 mg by mouth daily.      . Probiotic Product (PROBIOTIC PO) Take by mouth. Will take one by mouth once daily and some days will take two       . Tafluprost (ZIOPTAN) 0.0015 % SOLN Apply to eye as directed.         Past Medical History  Diagnosis Date  . Glaucoma   . IBS (irritable bowel syndrome)   . H. pylori infection   . Headache   . Hyperlipidemia   . Hypertension   . Urinary incontinence     oab  . Arthritis     Past Surgical History  Procedure Date  . Bladder suspension in 1986 & 1987  . Abdominal hysterectomy 1998  . Breast biopsy 1997  . Colonoscopy     History   Social History  . Marital Status: Divorced    Spouse Name: N/A    Number of Children: 3  . Years of Education: N/A   Occupational History  . Retired    Social History Main Topics  . Smoking status: Never Smoker   . 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 divorced, lives alone  . Alcohol Use: Yes     Social  . Drug Use: No  . Sexually Active: Not on file   Other Topics Concern  . Not on file   Social History Narrative  . No narrative on file    ROS: no fevers or chills, productive cough, hemoptysis, dysphasia, odynophagia, melena, hematochezia, dysuria, hematuria, rash, seizure activity, orthopnea, PND, pedal edema, claudication. Remaining systems are negative.  Physical Exam: Well-developed well-nourished in no acute distress.  Skin is warm and dry.  HEENT is normal.  Neck is supple.  Chest is clear to auscultation with normal expansion.  Cardiovascular exam is regular rate and rhythm.  Abdominal exam nontender or distended. No masses palpated. Extremities show no edema. neuro grossly intact

## 2011-11-24 NOTE — Patient Instructions (Addendum)
Your physician recommends that you schedule a follow-up appointment in: AS NEEDED  

## 2011-11-24 NOTE — Assessment & Plan Note (Signed)
No further symptoms. Previous Myoview negative. No plans for further evaluation.

## 2011-11-24 NOTE — Assessment & Plan Note (Signed)
Blood pressure has been controlled at home. Her systolic runs between 100-130 average. Plan to continue present medications.

## 2011-11-29 ENCOUNTER — Ambulatory Visit (INDEPENDENT_AMBULATORY_CARE_PROVIDER_SITE_OTHER): Payer: Medicare Other

## 2011-11-29 ENCOUNTER — Telehealth: Payer: Self-pay | Admitting: Gastroenterology

## 2011-11-29 DIAGNOSIS — K589 Irritable bowel syndrome without diarrhea: Secondary | ICD-10-CM

## 2011-11-29 NOTE — Telephone Encounter (Signed)
Patient is c/o continued constipation and abdominal discomfort.  She has not been on a high fiber diet and has not been taking Miralax as recommended at the last office visit.  I have asked her to stark back on a high fiber diet and miralax as previously recommended.  She is scheduled to see Dr Russella Dar on 12/16/11 2:45

## 2011-11-29 NOTE — Telephone Encounter (Signed)
Left message for patient to call back  

## 2011-12-14 ENCOUNTER — Ambulatory Visit (HOSPITAL_COMMUNITY): Payer: Medicare Other

## 2011-12-16 ENCOUNTER — Encounter: Payer: Self-pay | Admitting: Gastroenterology

## 2011-12-16 ENCOUNTER — Ambulatory Visit (HOSPITAL_COMMUNITY): Payer: Medicare Other

## 2011-12-16 ENCOUNTER — Ambulatory Visit (INDEPENDENT_AMBULATORY_CARE_PROVIDER_SITE_OTHER): Payer: Medicare Other | Admitting: Gastroenterology

## 2011-12-16 VITALS — BP 130/70 | HR 68 | Ht 61.0 in | Wt 119.0 lb

## 2011-12-16 DIAGNOSIS — Z860100 Personal history of colon polyps, unspecified: Secondary | ICD-10-CM

## 2011-12-16 DIAGNOSIS — K589 Irritable bowel syndrome without diarrhea: Secondary | ICD-10-CM

## 2011-12-16 MED ORDER — LINACLOTIDE 290 MCG PO CAPS: 1.0000 | ORAL_CAPSULE | Freq: Every day | ORAL | Status: DC

## 2011-12-16 NOTE — Progress Notes (Signed)
History of Present Illness: This is a 71 year-old female with long history of constipation predominant irritable bowel syndrome. She underwent colonoscopy in October 2012 showing one small adenomatous colon polyp. She has ongoing problems with her irritable bowel syndrome and this is quite frustrating for her. Her symptoms have not changed they are essentially just persistent and they have been present for many years. She feels she does not fully evacuate with bowel movements although she will have between 1 and 3 bowel movements each day. She notes frequent bloating and lower abdominal discomfort as well. She cannot correlate any specific foods that exacerbate her symptoms. She tried MiraLax but this was not effective in relieving her symptoms. She recently saw Janalee Dane to consider participating in an IBS research study.   Current Medications, Allergies, Past Medical History, Past Surgical History, Family History and Social History were reviewed in [employer] record.  Physical Exam: General: Well developed , well nourished, no acute distress Head: Normocephalic and atraumatic Eyes:  sclerae anicteric, EOMI Ears: Normal auditory acuity Mouth: No deformity or lesions Lungs: Clear throughout to auscultation Heart: Regular rate and rhythm; no murmurs, rubs or bruits Abdomen: Soft, non tender and non distended. No masses, hepatosplenomegaly or hernias noted. Normal Bowel sounds Musculoskeletal: Symmetrical with no gross deformities  Pulses:  Normal pulses noted Extremities: No clubbing, cyanosis, edema or deformities noted Neurological: Alert oriented x 4, grossly nonfocal Psychological:  Alert and cooperative. Normal mood and affect  Assessment and Recommendations:  1. Constipation predominant irritable bowel syndrome. I attempted to answer all her questions. We had a discussion about irritable bowel syndrome and the most effective strategies to manage the symptoms of  irritable bowel syndrome. After discussing the options she is comfortable with a trial of Linzess daily. Samples provided for 2 weeks. She will contact us in 1 to 2 weeks to let us know her progress. If this is not adequate to manage her symptoms consider a trial of Amitiza, a trial of Xifaxan or a trial of an antispasmodic.  2. Personal history of adenomatous colon polyps. Surveillance colonoscopy recommended October 2017.

## 2011-12-16 NOTE — Patient Instructions (Addendum)
We have given you samples of Linzess to take one tablet by mouth once daily x 2 weeks. Please call back in 2 weeks with a update on your symptoms.  cc: Rene Paci, MD

## 2011-12-19 ENCOUNTER — Telehealth: Payer: Self-pay | Admitting: Gastroenterology

## 2011-12-19 NOTE — Telephone Encounter (Signed)
Left message for patient to call back  

## 2011-12-20 NOTE — Telephone Encounter (Signed)
Left message for patient to call back  

## 2011-12-21 ENCOUNTER — Ambulatory Visit (HOSPITAL_COMMUNITY)
Admission: RE | Admit: 2011-12-21 | Discharge: 2011-12-21 | Disposition: A | Discharge status: Home / Self Care | Payer: Medicare Other | Source: Ambulatory Visit | Attending: Internal Medicine | Admitting: Internal Medicine

## 2011-12-21 DIAGNOSIS — Z1231 Encounter for screening mammogram for malignant neoplasm of breast: Secondary | ICD-10-CM | POA: Insufficient documentation

## 2011-12-21 NOTE — Telephone Encounter (Signed)
Patient reports she took Linzess prior to a business meeting and had to leave the meeting all day.  She is advised to try again when she is at home and try it in the evening.  She will call back for any additional questions or concerns

## 2011-12-23 ENCOUNTER — Telehealth: Payer: Self-pay | Admitting: Gastroenterology

## 2011-12-23 MED ORDER — LINACLOTIDE 145 MCG PO CAPS: 145.0000 ug | ORAL_CAPSULE | Freq: Every day | ORAL | Status: DC

## 2011-12-23 NOTE — Telephone Encounter (Signed)
Agree 

## 2011-12-23 NOTE — Telephone Encounter (Signed)
See previous telephone call from 12/19/11.  Patient tried Linzess again and had severe diarrhea and some bright red blood on the tissue when she wiped.  She said that she had 5-6 BM after taking the 290 mg Linzess.  I have offered her samples of the 145 mcg Linzess.  She will come pick that up and try one more time.  She is advised that the minimal rectal bleeding is most likely hemorrhoidal, but if she passes any blood independent of stool or an increase in rectal bleeding over the weekend she should be evaluated in the ER.  She will call back for any questions or concerns

## 2011-12-28 ENCOUNTER — Telehealth: Payer: Self-pay | Admitting: General Practice

## 2011-12-28 DIAGNOSIS — H8110 Benign paroxysmal vertigo, unspecified ear: Secondary | ICD-10-CM

## 2011-12-28 NOTE — Telephone Encounter (Signed)
Neuro refer done

## 2011-12-28 NOTE — Telephone Encounter (Signed)
Pt.notified

## 2011-12-28 NOTE — Telephone Encounter (Signed)
Pt called stating she would like a referral to someone for positional vertigo. Please advise.

## 2012-01-10 ENCOUNTER — Ambulatory Visit (INDEPENDENT_AMBULATORY_CARE_PROVIDER_SITE_OTHER): Payer: Medicare Other | Admitting: *Deleted

## 2012-01-10 DIAGNOSIS — Z23 Encounter for immunization: Secondary | ICD-10-CM

## 2012-02-17 ENCOUNTER — Telehealth: Payer: Self-pay | Admitting: Internal Medicine

## 2012-02-17 NOTE — Telephone Encounter (Signed)
Annual visit for med review required for rx refills

## 2012-02-17 NOTE — Telephone Encounter (Signed)
Called pt no answer LMOM md response...lmb 

## 2012-02-17 NOTE — Telephone Encounter (Signed)
Caller: Joan Welch/Patient; Phone: [phone]; Reason for Call: Pt is calling to find out guidelines expected by MD for visits.  RN could not find this info in practice outline.  Pt wants to know is it yearly or every other year.  Could office call and advise her.

## 2012-03-06 ENCOUNTER — Other Ambulatory Visit: Payer: Self-pay

## 2012-03-09 ENCOUNTER — Telehealth: Payer: Self-pay | Admitting: Gastroenterology

## 2012-03-09 ENCOUNTER — Encounter: Payer: Self-pay | Admitting: Internal Medicine

## 2012-03-09 DIAGNOSIS — C44310 Basal cell carcinoma of skin of unspecified parts of face: Secondary | ICD-10-CM | POA: Insufficient documentation

## 2012-03-09 NOTE — Telephone Encounter (Signed)
Patient has burning lower abdominal pain about 2 inches below her umbilicus after she eats spicy foods.  She is concerned that she has a gastric ulcer.  I did explain that would not be a gastric ulcer, but she could have irritation other areas of the GI tract.  She states it only happens after a spicy meal.  She is offered an appt to discuss or try to avoid spicy foods and see if this takes care of the problem.  She wants to try dietary changes and will call back for any additional questions

## 2012-06-27 ENCOUNTER — Encounter: Payer: Self-pay | Admitting: Internal Medicine

## 2012-06-27 ENCOUNTER — Ambulatory Visit (INDEPENDENT_AMBULATORY_CARE_PROVIDER_SITE_OTHER): Payer: Medicare Other | Admitting: Internal Medicine

## 2012-06-27 VITALS — BP 122/70 | HR 59 | Temp 98.0°F | Ht 61.0 in | Wt 118.1 lb

## 2012-06-27 DIAGNOSIS — Z13820 Encounter for screening for osteoporosis: Secondary | ICD-10-CM

## 2012-06-27 DIAGNOSIS — R5381 Other malaise: Secondary | ICD-10-CM

## 2012-06-27 DIAGNOSIS — H8110 Benign paroxysmal vertigo, unspecified ear: Secondary | ICD-10-CM

## 2012-06-27 DIAGNOSIS — K589 Irritable bowel syndrome without diarrhea: Secondary | ICD-10-CM

## 2012-06-27 DIAGNOSIS — N3281 Overactive bladder: Secondary | ICD-10-CM | POA: Insufficient documentation

## 2012-06-27 DIAGNOSIS — I10 Essential (primary) hypertension: Secondary | ICD-10-CM

## 2012-06-27 DIAGNOSIS — R5383 Other fatigue: Secondary | ICD-10-CM

## 2012-06-27 DIAGNOSIS — Z0000 Encounter for general adult medical examination without abnormal findings: Secondary | ICD-10-CM

## 2012-06-27 DIAGNOSIS — E785 Hyperlipidemia, unspecified: Secondary | ICD-10-CM

## 2012-06-27 DIAGNOSIS — E559 Vitamin D deficiency, unspecified: Secondary | ICD-10-CM

## 2012-06-27 MED ORDER — ALIGN 4 MG PO CAPS: 1.0000 | ORAL_CAPSULE | ORAL | Status: DC

## 2012-06-27 NOTE — Assessment & Plan Note (Signed)
Improved on low dose ARB -but higher dose exacerbates BPPV symptoms - Added HCTZ 05/2011 and saw cards 11/2011 SBP range 110-130 on home log The current medical regimen is effective;  continue present plan and medications.   BP Readings from Last 3 Encounters:  06/27/12 122/70  12/16/11 130/70  11/24/11 150/70

## 2012-06-27 NOTE — Assessment & Plan Note (Signed)
Prev on statin - upward trend in total and LDL since off Lipitor 10/2009 -  recheck done today (annually) declines any statin due to adv effects on lipitor (myalgia) pt has worked on lifestyle control with weight/exercise efforts Reconsider alt statin or nonstatin options depending on results

## 2012-06-27 NOTE — Patient Instructions (Signed)
It was good to see you today. We have reviewed your prior records including labs and tests today Health Maintenance reviewed - all recommended immunizations and age-appropriate screenings are up-to-date. Test(s) ordered today. Your results will be released to MyChart (or called to you) after review, usually within 72hours after test completion. If any changes need to be made, you will be notified at that same time. Medications reviewed and updated, no changes at this time. we'll make referral to: PA or NP with Dr. Russella Dar, bone density scanning at [facility], vestibular rehabilitation. Our office will contact you regarding appointment(s) once made. Please schedule followup in 12 months for annual visit and labs, call sooner if problems.  Health Maintenance, Females A healthy lifestyle and preventative care can promote health and wellness.  Maintain regular health, dental, and eye exams.  Eat a healthy diet. Foods like vegetables, fruits, whole grains, low-fat dairy products, and lean protein foods contain the nutrients you need without too many calories. Decrease your intake of foods high in solid fats, added sugars, and salt. Get information about a proper diet from your caregiver, if necessary.  Regular physical exercise is one of the most important things you can do for your health. Most adults should get at least 150 minutes of moderate-intensity exercise (any activity that increases your heart rate and causes you to sweat) each week. In addition, most adults need muscle-strengthening exercises on 2 or more days a week.   Maintain a healthy weight. The body mass index (BMI) is a screening tool to identify possible weight problems. It provides an estimate of body fat based on height and weight. Your caregiver can help determine your BMI, and can help you achieve or maintain a healthy weight. For adults 20 years and older:  A BMI below 18.5 is considered underweight.  A BMI  of 18.5 to 24.9 is normal.  A BMI of 25 to 29.9 is considered overweight.  A BMI of 30 and above is considered obese.  Maintain normal blood lipids and cholesterol by exercising and minimizing your intake of saturated fat. Eat a balanced diet with plenty of fruits and vegetables. Blood tests for lipids and cholesterol should begin at age 72 and be repeated every 5 years. If your lipid or cholesterol levels are high, you are over 50, or you are a high risk for heart disease, you may need your cholesterol levels checked more frequently.Ongoing high lipid and cholesterol levels should be treated with medicines if diet and exercise are not effective.  If you smoke, find out from your caregiver how to quit. If you do not use tobacco, do not start.  If you are pregnant, do not drink alcohol. If you are breastfeeding, be very cautious about drinking alcohol. If you are not pregnant and choose to drink alcohol, do not exceed 1 drink per day. One drink is considered to be 12 ounces (355 mL) of beer, 5 ounces (148 mL) of wine, or 1.5 ounces (44 mL) of liquor.  Avoid use of street drugs. Do not share needles with anyone. Ask for help if you need support or instructions about stopping the use of drugs.  High blood pressure causes heart disease and increases the risk of stroke. Blood pressure should be checked at least every 1 to 2 years. Ongoing high blood pressure should be treated with medicines, if weight loss and exercise are not effective.  If you are 22 to 72 years old, ask your caregiver if you should take  aspirin to prevent strokes.  Diabetes screening involves taking a blood sample to check your fasting blood sugar level. This should be done once every 3 years, after age 19, if you are within normal weight and without risk factors for diabetes. Testing should be considered at a younger age or be carried out more frequently if you are overweight and have at least 1 risk factor for diabetes.  Breast  cancer screening is essential preventative care for women. You should practice "breast self-awareness." This means understanding the normal appearance and feel of your breasts and may include breast self-examination. Any changes detected, no matter how small, should be reported to a caregiver. Women in their 27s and 30s should have a clinical breast exam (CBE) by a caregiver as part of a regular health exam every 1 to 3 years. After age 51, women should have a CBE every year. Starting at age 57, women should consider having a mammogram (breast X-ray) every year. Women who have a family history of breast cancer should talk to their caregiver about genetic screening. Women at a high risk of breast cancer should talk to their caregiver about having an MRI and a mammogram every year.  The Pap test is a screening test for cervical cancer. Women should have a Pap test starting at age 42. Between ages 59 and 68, Pap tests should be repeated every 2 years. Beginning at age 60, you should have a Pap test every 3 years as long as the past 3 Pap tests have been normal. If you had a hysterectomy for a problem that was not cancer or a condition that could lead to cancer, then you no longer need Pap tests. If you are between ages 49 and 76, and you have had normal Pap tests going back 10 years, you no longer need Pap tests. If you have had past treatment for cervical cancer or a condition that could lead to cancer, you need Pap tests and screening for cancer for at least 20 years after your treatment. If Pap tests have been discontinued, risk factors (such as a new sexual partner) need to be reassessed to determine if screening should be resumed. Some women have medical problems that increase the chance of getting cervical cancer. In these cases, your caregiver may recommend more frequent screening and Pap tests.  The human papillomavirus (HPV) test is an additional test that may be used for cervical cancer screening. The HPV  test looks for the virus that can cause the cell changes on the cervix. The cells collected during the Pap test can be tested for HPV. The HPV test could be used to screen women aged 39 years and older, and should be used in women of any age who have unclear Pap test results. After the age of 82, women should have HPV testing at the same frequency as a Pap test.  Colorectal cancer can be detected and often prevented. Most routine colorectal cancer screening begins at the age of 41 and continues through age 71. However, your caregiver may recommend screening at an earlier age if you have risk factors for colon cancer. On a yearly basis, your caregiver may provide home test kits to check for hidden blood in the stool. Use of a small camera at the end of a tube, to directly examine the colon (sigmoidoscopy or colonoscopy), can detect the earliest forms of colorectal cancer. Talk to your caregiver about this at age 38, when routine screening begins. Direct examination of the colon  should be repeated every 5 to 10 years through age 49, unless early forms of pre-cancerous polyps or small growths are found.  Hepatitis C blood testing is recommended for all people born from [birth year] through 1965 and any individual with known risks for hepatitis C.  Practice safe sex. Use condoms and avoid high-risk sexual practices to reduce the spread of sexually transmitted infections (STIs). Sexually active women aged 54 and younger should be checked for Chlamydia, which is a common sexually transmitted infection. Older women with new or multiple partners should also be tested for Chlamydia. Testing for other STIs is recommended if you are sexually active and at increased risk.  Osteoporosis is a disease in which the bones lose minerals and strength with aging. This can result in serious bone fractures. The risk of osteoporosis can be identified using a bone density scan. Women ages 37 and over and women at risk for fractures or  osteoporosis should discuss screening with their caregivers. Ask your caregiver whether you should be taking a calcium supplement or vitamin D to reduce the rate of osteoporosis.  Menopause can be associated with physical symptoms and risks. Hormone replacement therapy is available to decrease symptoms and risks. You should talk to your caregiver about whether hormone replacement therapy is right for you.  Use sunscreen with a sun protection factor (SPF) of 30 or greater. Apply sunscreen liberally and repeatedly throughout the day. You should seek shade when your shadow is shorter than you. Protect yourself by wearing long sleeves, pants, a wide-brimmed hat, and sunglasses year round, whenever you are outdoors.  Notify your caregiver of new moles or changes in moles, especially if there is a change in shape or color. Also notify your caregiver if a mole is larger than the size of a pencil eraser.  Stay current with your immunizations. Document Released: 10/04/2010 Document Revised: 06/13/2011 Document Reviewed: 10/04/2010 [facility] Patient Information 2013 [place], [place].

## 2012-06-27 NOTE — Assessment & Plan Note (Signed)
Chronic symptoms -  Prior trial vestibular rehab not well tolerated so DC'd same No meds helpful or other etiology identified Refer back to vestib rehab at this time

## 2012-06-27 NOTE — Progress Notes (Signed)
Subjective:    Patient ID: Joan Welch, female    DOB: 1940/10/26, 72 y.o.   MRN: [id number]  HPI   Here for medicare wellness  Diet: heart healthy Physical activity: sedentary Depression/mood screen: negative Hearing: intact to whispered voice Visual acuity: grossly normal, performs annual eye exam  ADLs: capable Fall risk: none Home safety: good Cognitive evaluation: intact to orientation, naming, recall and repetition EOL planning: adv directives, full code/ I agree  I have personally reviewed and have noted 1. The patient's medical and social history 2. Their use of alcohol, tobacco or illicit drugs 3. Their current medications and supplements 4. The patient's functional ability including ADL's, fall risks, home safety risks and hearing or visual impairment. 5. Diet and physical activities 6. Evidence for depression or mood disorders  Also reviewed chronic medical issues  Past Medical History  Diagnosis Date  . Glaucoma   . IBS (irritable bowel syndrome)   . H. pylori infection   . Headache   . Hyperlipidemia   . Hypertension   . Urinary incontinence     oab  . Arthritis   . Diverticulosis    Family History  Problem Relation Age of Onset  . Diabetes Daughter     Type 1  . Breast cancer Sister   . Irritable bowel syndrome Mother   . Colon cancer Neg Hx   . Hypertension Sister    History  Substance Use Topics  . Smoking status: Never Smoker   . 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 divorced, lives alone  . Alcohol Use: Yes     Comment: Social     Review of Systems   Constitutional: Negative for fever or weight change.  Respiratory: Negative for cough and shortness of breath.   Cardiovascular: Negative for chest pain or palpitations.  Gastrointestinal: Negative for abdominal pain, no bowel  changes.  Musculoskeletal: Negative for gait problem or joint swelling.  Skin: Negative for rash.  Neurological: Positive for positional dizziness; no headache.  No other specific complaints in a complete review of systems (except as listed in HPI above).   Objective:   Physical Exam  BP 122/70  Pulse 59  Temp(Src) 98 F (36.7 C) (Oral)  Ht 5\' 1"  (1.549 m)  Wt 118 lb 1.9 oz (53.579 kg)  BMI 22.33 kg/m2  SpO2 97%  Wt Readings from Last 3 Encounters:  06/27/12 118 lb 1.9 oz (53.579 kg)  12/16/11 119 lb (53.978 kg)  11/24/11 118 lb (53.524 kg)   Constitutional: She appears well-developed and well-nourished. No distress.  HENT: Head: Normocephalic and atraumatic. Ears: B TMs ok, no erythema or effusion; Nose: Nose normal. Mouth/Throat: Oropharynx is clear and moist. No oropharyngeal exudate.  Eyes: Conjunctivae and EOM are normal. Pupils are equal, round, and reactive to light. No scleral icterus.  Neck: Normal range of motion. Neck supple. No JVD present. No thyromegaly present.  Cardiovascular: Normal rate, regular rhythm and normal heart sounds.  No murmur heard. No BLE edema. Pulmonary/Chest: Effort normal and breath sounds normal. No respiratory distress. She has no wheezes.  Abdominal: Soft. Bowel sounds are normal. She exhibits no distension. There is no tenderness. no masses Musculoskeletal: Normal range of motion, no joint effusions. No gross deformities Neurological: She is alert and oriented to person, place, and time. No cranial nerve deficit. Coordination normal.  Skin:  Skin is warm and dry. No rash noted. No erythema.  Psychiatric: She has a normal mood and affect. Her behavior is normal. Judgment and thought content normal.       Lab Results  Component Value Date   WBC 4.3 04/18/2011   HGB 13.9 04/18/2011   HCT 41.1 04/18/2011   PLT 221 04/18/2011   CHOL 211* 04/18/2011   TRIG 112 04/18/2011   HDL 51 04/18/2011   LDLDIRECT 168.9 02/09/2010   ALT 19 11/15/2010   AST  28 11/15/2010   NA 141 08/26/2011   K 3.7 08/26/2011   CL 106 08/26/2011   CREATININE 0.6 08/26/2011   BUN 21 08/26/2011   CO2 29 08/26/2011   TSH 1.178 04/17/2011   HGBA1C 5.4 04/17/2011   ECG: Normal sinus rhythm at 58 beats per minute. Nonspecific T wave abnormality diffuse  Assessment & Plan:  AWV/v70.0/CPX - Today patient counseled on age appropriate routine health concerns for screening and prevention, each reviewed and up to date or declined. Immunizations reviewed and up to date or declined. Labs/ECG reviewed. Risk factors for depression reviewed and negative. Hearing function and visual acuity are intact. ADLs screened and addressed as needed. Functional ability and level of safety reviewed and appropriate. Education, counseling and referrals performed based on assessed risks today. Patient provided with a copy of personalized plan for preventive services.  Fatigue - nonspecific symptoms/exam - check screening labs  Vit D deficiency- recheck level and DEXA  Also see problem list. Medications reviewed today.

## 2012-06-27 NOTE — Assessment & Plan Note (Signed)
Evaluation by urology Dr. Jacquelyne Balint and 2013 reviewed Ineffective response to PTNS, Myrbertriq or oxytrol patch

## 2012-06-27 NOTE — Assessment & Plan Note (Signed)
Chronic history of same, wishes to better understand emotional component of this disease Somewhat improved with herbal therapy and probiotic Symptoms unimproved with trial Linzess (caused blood in stool per report) Remains concern for possibility of ulcer or H. Pylori infection Refer back to GI to discuss same, patient specifically request NP or PA

## 2012-06-28 ENCOUNTER — Telehealth: Payer: Self-pay

## 2012-06-28 ENCOUNTER — Other Ambulatory Visit (INDEPENDENT_AMBULATORY_CARE_PROVIDER_SITE_OTHER): Payer: Medicare Other

## 2012-06-28 DIAGNOSIS — I10 Essential (primary) hypertension: Secondary | ICD-10-CM

## 2012-06-28 DIAGNOSIS — R5381 Other malaise: Secondary | ICD-10-CM

## 2012-06-28 DIAGNOSIS — Z13820 Encounter for screening for osteoporosis: Secondary | ICD-10-CM

## 2012-06-28 DIAGNOSIS — Z0000 Encounter for general adult medical examination without abnormal findings: Secondary | ICD-10-CM

## 2012-06-28 DIAGNOSIS — E559 Vitamin D deficiency, unspecified: Secondary | ICD-10-CM

## 2012-06-28 LAB — BASIC METABOLIC PANEL
BUN: 18 mg/dL (ref 6–23)
CO2: 31 mEq/L (ref 19–32)
Calcium: 9.4 mg/dL (ref 8.4–10.5)
Creatinine, Ser: 0.7 mg/dL (ref 0.4–1.2)
Glucose, Bld: 91 mg/dL (ref 70–99)

## 2012-06-28 LAB — CBC WITH DIFFERENTIAL/PLATELET
Basophils Absolute: 0 10*3/uL (ref 0.0–0.1)
Eosinophils Relative: 2.9 % (ref 0.0–5.0)
HCT: 40.7 % (ref 36.0–46.0)
Lymphs Abs: 1.6 10*3/uL (ref 0.7–4.0)
Monocytes Absolute: 0.4 10*3/uL (ref 0.1–1.0)
Monocytes Relative: 9.5 % (ref 3.0–12.0)
Neutrophils Relative %: 53.6 % (ref 43.0–77.0)
Platelets: 245 10*3/uL (ref 150.0–400.0)
RDW: 13 % (ref 11.5–14.6)
WBC: 4.7 10*3/uL (ref 4.5–10.5)

## 2012-06-28 LAB — LDL CHOLESTEROL, DIRECT: Direct LDL: 146.6 mg/dL

## 2012-06-28 LAB — HEPATIC FUNCTION PANEL
AST: 29 U/L (ref 0–37)
Albumin: 4 g/dL (ref 3.5–5.2)
Alkaline Phosphatase: 84 U/L (ref 39–117)
Bilirubin, Direct: 0.1 mg/dL (ref 0.0–0.3)
Total Bilirubin: 0.7 mg/dL (ref 0.3–1.2)

## 2012-06-28 LAB — LIPID PANEL: Triglycerides: 83 mg/dL (ref 0.0–149.0)

## 2012-06-28 LAB — TSH: TSH: 0.79 u[IU]/mL (ref 0.35–5.50)

## 2012-06-28 LAB — FERRITIN: Ferritin: 59.7 ng/mL (ref 10.0–291.0)

## 2012-06-28 NOTE — Telephone Encounter (Signed)
Will change code to postmenopausal assuming this code is "covered" - please let me know

## 2012-06-28 NOTE — Telephone Encounter (Signed)
[facility] stating that order for DXA with Dx code of screening for osteoporosis is not covered by insurance. They are requesting Dx be changed to osteoporosis but this is not active on pt problem list. Can MD change to post-menopausal?

## 2012-07-05 ENCOUNTER — Ambulatory Visit: Payer: Medicare Other | Attending: Internal Medicine | Admitting: Physical Therapy

## 2012-07-05 DIAGNOSIS — IMO0001 Reserved for inherently not codable concepts without codable children: Secondary | ICD-10-CM | POA: Insufficient documentation

## 2012-07-05 DIAGNOSIS — R42 Dizziness and giddiness: Secondary | ICD-10-CM | POA: Insufficient documentation

## 2012-07-09 ENCOUNTER — Encounter: Payer: Self-pay | Admitting: Internal Medicine

## 2012-07-12 ENCOUNTER — Encounter: Payer: Self-pay | Admitting: Internal Medicine

## 2012-07-12 ENCOUNTER — Encounter: Payer: Medicare Other | Admitting: Physical Therapy

## 2012-07-19 ENCOUNTER — Ambulatory Visit: Payer: Medicare Other | Admitting: Physical Therapy

## 2012-07-24 ENCOUNTER — Ambulatory Visit: Payer: Medicare Other | Admitting: Physical Therapy

## 2012-07-25 ENCOUNTER — Encounter: Payer: Medicare Other | Admitting: Physical Therapy

## 2012-08-01 ENCOUNTER — Encounter: Payer: Medicare Other | Admitting: Physical Therapy

## 2012-08-08 ENCOUNTER — Ambulatory Visit: Payer: Medicare Other | Attending: Internal Medicine | Admitting: Physical Therapy

## 2012-08-08 DIAGNOSIS — IMO0001 Reserved for inherently not codable concepts without codable children: Secondary | ICD-10-CM | POA: Insufficient documentation

## 2012-08-08 DIAGNOSIS — R42 Dizziness and giddiness: Secondary | ICD-10-CM | POA: Insufficient documentation

## 2012-08-29 ENCOUNTER — Other Ambulatory Visit: Payer: Self-pay | Admitting: Cardiology

## 2012-09-11 ENCOUNTER — Ambulatory Visit: Payer: Medicare Other | Admitting: Physical Therapy

## 2012-09-18 ENCOUNTER — Ambulatory Visit: Payer: Medicare Other | Admitting: Physical Therapy

## 2012-10-22 ENCOUNTER — Encounter: Payer: Self-pay | Admitting: Internal Medicine

## 2012-11-05 ENCOUNTER — Telehealth: Payer: Self-pay | Admitting: *Deleted

## 2012-11-05 NOTE — Telephone Encounter (Signed)
Pt called requesting a Rx for Scopolamine Patches.  She states she will be flying a lot and would like this medication.  Please advise

## 2012-11-06 ENCOUNTER — Ambulatory Visit: Payer: Medicare Other | Attending: Internal Medicine | Admitting: Physical Therapy

## 2012-11-06 ENCOUNTER — Other Ambulatory Visit: Payer: Self-pay | Admitting: Internal Medicine

## 2012-11-06 DIAGNOSIS — R42 Dizziness and giddiness: Secondary | ICD-10-CM | POA: Insufficient documentation

## 2012-11-06 DIAGNOSIS — IMO0001 Reserved for inherently not codable concepts without codable children: Secondary | ICD-10-CM | POA: Insufficient documentation

## 2012-11-06 MED ORDER — SCOPOLAMINE 1 MG/3DAYS TD PT72: 1.0000 | MEDICATED_PATCH | TRANSDERMAL | Status: DC

## 2012-11-06 NOTE — Telephone Encounter (Signed)
I have called this into her pharmacy but it looks like her insurance does not cover this medication. Please make her aware that it may be expensive.

## 2012-11-06 NOTE — Telephone Encounter (Signed)
Unable to reach pt to inform of message.

## 2012-11-07 ENCOUNTER — Telehealth: Payer: Self-pay | Admitting: *Deleted

## 2012-11-07 ENCOUNTER — Other Ambulatory Visit: Payer: Self-pay

## 2012-11-07 NOTE — Telephone Encounter (Signed)
Left message on VM of MD message.

## 2012-11-15 ENCOUNTER — Telehealth: Payer: Self-pay | Admitting: *Deleted

## 2012-11-15 ENCOUNTER — Encounter: Payer: Self-pay | Admitting: Internal Medicine

## 2012-11-15 NOTE — Telephone Encounter (Signed)
Pt mail letter stating she had additional immunization at [facility] since she is planning to travel out of the country. Would like them added to her records...lmb

## 2012-12-12 ENCOUNTER — Telehealth: Payer: Self-pay | Admitting: *Deleted

## 2012-12-12 DIAGNOSIS — G4730 Sleep apnea, unspecified: Secondary | ICD-10-CM

## 2012-12-12 NOTE — Telephone Encounter (Signed)
Refer to sleep in pulm division done

## 2012-12-12 NOTE — Telephone Encounter (Signed)
Spoke with pt advised of MDs order

## 2012-12-12 NOTE — Telephone Encounter (Signed)
Pt called requesting a referral to a Sleep Specialist.  Please advise

## 2012-12-18 ENCOUNTER — Other Ambulatory Visit: Payer: Self-pay | Admitting: Internal Medicine

## 2012-12-18 DIAGNOSIS — Z1231 Encounter for screening mammogram for malignant neoplasm of breast: Secondary | ICD-10-CM

## 2013-01-10 ENCOUNTER — Ambulatory Visit: Payer: Medicare Other

## 2013-01-11 ENCOUNTER — Institutional Professional Consult (permissible substitution): Payer: Medicare Other | Admitting: Pulmonary Disease

## 2013-01-15 ENCOUNTER — Telehealth (INDEPENDENT_AMBULATORY_CARE_PROVIDER_SITE_OTHER): Payer: Medicare Other

## 2013-01-15 ENCOUNTER — Ambulatory Visit (INDEPENDENT_AMBULATORY_CARE_PROVIDER_SITE_OTHER): Payer: Medicare Other

## 2013-01-15 DIAGNOSIS — Z23 Encounter for immunization: Secondary | ICD-10-CM

## 2013-01-15 NOTE — Telephone Encounter (Signed)
Pt stopped by to received flu vaccine and would like to know who MD recommends as far as a GYN. Thanks

## 2013-01-16 NOTE — Telephone Encounter (Signed)
Left msg for patient with recommended place for GYN, pt instructed to return call if she would like referral...ds,cma

## 2013-01-16 NOTE — Telephone Encounter (Signed)
Providers at [facility] and [facility] are very good. Please let me know if pt needs referral - thanks

## 2013-01-22 ENCOUNTER — Other Ambulatory Visit: Payer: Self-pay | Admitting: Internal Medicine

## 2013-01-22 ENCOUNTER — Ambulatory Visit (HOSPITAL_COMMUNITY)
Admission: RE | Admit: 2013-01-22 | Discharge: 2013-01-22 | Disposition: A | Discharge status: Home / Self Care | Payer: Medicare Other | Source: Ambulatory Visit | Attending: Internal Medicine | Admitting: Internal Medicine

## 2013-01-22 DIAGNOSIS — Z1231 Encounter for screening mammogram for malignant neoplasm of breast: Secondary | ICD-10-CM

## 2013-02-02 LAB — HM PAP SMEAR

## 2013-02-04 LAB — HM DEXA SCAN

## 2013-04-05 ENCOUNTER — Other Ambulatory Visit: Payer: Self-pay

## 2013-04-05 MED ORDER — LOSARTAN POTASSIUM-HCTZ 50-12.5 MG PO TABS: ORAL_TABLET | ORAL | Status: DC

## 2013-07-09 ENCOUNTER — Other Ambulatory Visit: Payer: Self-pay | Admitting: Cardiology

## 2013-07-19 ENCOUNTER — Other Ambulatory Visit: Payer: Self-pay

## 2013-07-30 ENCOUNTER — Other Ambulatory Visit: Payer: Self-pay | Admitting: Gastroenterology

## 2013-07-30 DIAGNOSIS — R109 Unspecified abdominal pain: Secondary | ICD-10-CM

## 2013-07-30 LAB — CBC AND DIFFERENTIAL
HCT: 43 % (ref 36–46)
Hemoglobin: 14.7 g/dL (ref 12.0–16.0)
PLATELETS: 246 10*3/uL (ref 150–399)
WBC: 5.1 10^3/mL

## 2013-07-30 LAB — HEPATIC FUNCTION PANEL
ALT: 13 U/L (ref 7–35)
AST: 25 U/L (ref 13–35)
Alkaline Phosphatase: 66 U/L (ref 25–125)
BILIRUBIN, TOTAL: 0.6 mg/dL

## 2013-07-30 LAB — BASIC METABOLIC PANEL
BUN: 21 mg/dL (ref 4–21)
CREATININE: 0.6 mg/dL (ref 0.5–1.1)
GLUCOSE: 82 mg/dL
Potassium: 4.6 mmol/L (ref 3.4–5.3)
Sodium: 141 mmol/L (ref 137–147)

## 2013-08-01 ENCOUNTER — Encounter: Payer: Self-pay | Admitting: Internal Medicine

## 2013-08-01 ENCOUNTER — Ambulatory Visit (INDEPENDENT_AMBULATORY_CARE_PROVIDER_SITE_OTHER): Payer: Medicare Other | Admitting: Cardiology

## 2013-08-01 ENCOUNTER — Encounter: Payer: Self-pay | Admitting: Cardiology

## 2013-08-01 VITALS — BP 128/66 | HR 63 | Ht 61.0 in | Wt 120.0 lb

## 2013-08-01 DIAGNOSIS — I10 Essential (primary) hypertension: Secondary | ICD-10-CM

## 2013-08-01 DIAGNOSIS — E785 Hyperlipidemia, unspecified: Secondary | ICD-10-CM

## 2013-08-01 NOTE — Assessment & Plan Note (Signed)
Patient's blood pressure is excellent.She feels it would most likely be controlled off of medications. We therefore will discontinue her Hyzaar. She will follow her blood pressure at home and send [place] records. If controlled we will continue off of medications. If it trends up we will resume Hyzaar.

## 2013-08-01 NOTE — Patient Instructions (Signed)
Your physician recommends that you schedule a follow-up appointment in: AS NEEDED  STOP HYZAAR  TRACK BLOOD PRESSURE

## 2013-08-01 NOTE — Progress Notes (Signed)
      HPI: FU hypertension. Patient did have a nuclear study performed in February of 2013 and showed an ejection fraction of 65% and normal perfusion. I last saw her in August of 2013. Since then, the patient denies any dyspnea on exertion, orthopnea, PND, pedal edema, palpitations, syncope or chest pain.   Current Outpatient Prescriptions  Medication Sig Dispense Refill  . latanoprost (XALATAN) 0.005 % ophthalmic solution Place 1 drop into both eyes daily.      Marland Kitchen losartan-hydrochlorothiazide (HYZAAR) 50-12.5 MG per tablet Take 1 tablet by mouth daily.  90 tablet  0  . Probiotic Product (ALIGN) 4 MG CAPS Take 1 capsule by mouth every other day.       No current facility-administered medications for this visit.     Past Medical History  Diagnosis Date  . Glaucoma   . IBS (irritable bowel syndrome)   . H. pylori infection   . Headache(784.0)   . Hyperlipidemia   . Hypertension   . OAB (overactive bladder)   . Arthritis   . Diverticulosis   . BCC (basal cell carcinoma)     Past Surgical History  Procedure Laterality Date  . Bladder suspension  in [place]  . Abdominal hysterectomy  1998  . Breast biopsy  1997  . Colonoscopy    . Basal cell carcinoma excision  2013    History   Social History  . Marital Status: Divorced    Spouse Name: N/A    Number of Children: 3  . Years of Education: N/A   Occupational History  . Retired    Social History Main Topics  . Smoking status: Never Smoker   . 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 divorced, lives alone  . Alcohol Use: Yes     Comment: Social  . Drug Use: No  . Sexual Activity: Not on file   Other Topics Concern  . Not on file   Social History Narrative  . No narrative on file    ROS: no fevers or chills, productive cough, hemoptysis, dysphasia,  odynophagia, melena, hematochezia, dysuria, hematuria, rash, seizure activity, orthopnea, PND, pedal edema, claudication. Remaining systems are negative.  Physical Exam: Well-developed well-nourished in no acute distress.  Skin is warm and dry.  HEENT is normal.  Neck is supple.  Chest is clear to auscultation with normal expansion.  Cardiovascular exam is regular rate and rhythm.  Abdominal exam nontender or distended. No masses palpated. Extremities show no edema. neuro grossly intact  ECG Sinus rhythm at a rate of 63. Nonspecific ST changes.

## 2013-08-01 NOTE — Assessment & Plan Note (Signed)
Management per primary care. 

## 2013-08-02 ENCOUNTER — Telehealth: Payer: Self-pay | Admitting: Internal Medicine

## 2013-08-02 NOTE — Telephone Encounter (Signed)
Relevant patient education assigned to patient using Emmi. ° °

## 2013-08-06 ENCOUNTER — Other Ambulatory Visit: Payer: Medicare Other

## 2013-08-07 ENCOUNTER — Ambulatory Visit
Admission: RE | Admit: 2013-08-07 | Discharge: 2013-08-07 | Disposition: A | Discharge status: Home / Self Care | Payer: Medicare Other | Source: Ambulatory Visit | Attending: Gastroenterology | Admitting: Gastroenterology

## 2013-08-07 DIAGNOSIS — R109 Unspecified abdominal pain: Secondary | ICD-10-CM

## 2013-08-07 MED ORDER — IOHEXOL 300 MG/ML  SOLN
100.0000 mL | Freq: Once | INTRAMUSCULAR | Status: AC | PRN
  Administered 2013-08-07: 100 mL via INTRAVENOUS

## 2013-09-20 ENCOUNTER — Encounter: Payer: Self-pay | Admitting: Internal Medicine

## 2013-09-20 ENCOUNTER — Ambulatory Visit (INDEPENDENT_AMBULATORY_CARE_PROVIDER_SITE_OTHER): Payer: Medicare Other | Admitting: Internal Medicine

## 2013-09-20 VITALS — BP 122/70 | HR 58 | Temp 98.1°F | Wt 119.1 lb

## 2013-09-20 DIAGNOSIS — K589 Irritable bowel syndrome without diarrhea: Secondary | ICD-10-CM

## 2013-09-20 DIAGNOSIS — M810 Age-related osteoporosis without current pathological fracture: Secondary | ICD-10-CM | POA: Insufficient documentation

## 2013-09-20 DIAGNOSIS — Z0000 Encounter for general adult medical examination without abnormal findings: Secondary | ICD-10-CM

## 2013-09-20 MED ORDER — NAPROXEN SODIUM 220 MG PO TABS: 220.0000 mg | ORAL_TABLET | Freq: Two times a day (BID) | ORAL | Status: DC

## 2013-09-20 NOTE — Assessment & Plan Note (Signed)
Follows with gyn for same - adkins 02/2013 -2.5 by pt recall - will send report Discussed Ca and Vit D recommended

## 2013-09-20 NOTE — Progress Notes (Signed)
Subjective:    Patient ID: Joan Welch, female    DOB: Aug 05, 1940, 73 y.o.   MRN: [id number]  HPI   Here for medicare wellness  Diet: heart healthy  Physical activity: sedentary Depression/mood screen: negative Hearing: intact to whispered voice Visual acuity: grossly normal, performs annual eye exam  ADLs: capable Fall risk: none Home safety: good Cognitive evaluation: intact to orientation, naming, recall and repetition EOL planning: adv directives, full code/ I agree  I have personally reviewed and have noted 1. The patient's medical and social history 2. Their use of alcohol, tobacco or illicit drugs 3. Their current medications and supplements 4. The patient's functional ability including ADL's, fall risks, home safety risks and hearing or visual impairment. 5. Diet and physical activities 6. Evidence for depression or mood disorders  Also reviewed chronic medical issues and interval medical events  Past Medical History  Diagnosis Date  . Glaucoma   . IBS (irritable bowel syndrome)   . H. pylori infection   . Headache(784.0)   . Hyperlipidemia   . Hypertension   . OAB (overactive bladder)   . Arthritis   . Diverticulosis   . BCC (basal cell carcinoma)    Family History  Problem Relation Age of Onset  . Diabetes Daughter     Type 1  . Breast cancer Sister   . Irritable bowel syndrome Mother   . Colon cancer Neg Hx   . Hypertension Sister    History  Substance Use Topics  . Smoking status: Never Smoker   . 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 divorced, lives alone  . Alcohol Use: Yes     Comment: Social    Review of Systems  Constitutional: Negative for fatigue and unexpected weight change.  Respiratory: Negative for cough, shortness of breath and wheezing.   Cardiovascular: Negative for  chest pain, palpitations and leg swelling.  Gastrointestinal: Negative for nausea, abdominal pain and diarrhea.  Neurological: Negative for dizziness, weakness, light-headedness and headaches.  Psychiatric/Behavioral: Negative for dysphoric mood. The patient is not nervous/anxious.   All other systems reviewed and are negative.      Objective:   Physical Exam  BP 122/70  Pulse 58  Temp(Src) 98.1 F (36.7 C) (Oral)  Wt 119 lb 1.9 oz (54.032 kg)  SpO2 98% Wt Readings from Last 3 Encounters:  09/20/13 119 lb 1.9 oz (54.032 kg)  08/01/13 120 lb (54.432 kg)  06/27/12 118 lb 1.9 oz (53.579 kg)   Constitutional: She appears well-developed and well-nourished. No distress.  HENT: Head: Normocephalic and atraumatic. Ears: B TMs ok, no erythema or effusion; Nose: Nose normal. Mouth/Throat: Oropharynx is clear and moist. No oropharyngeal exudate.  Eyes: Conjunctivae and EOM are normal. Pupils are equal, round, and reactive to light. No scleral icterus.  Neck: Normal range of motion. Neck supple. No JVD present. No thyromegaly present.  Cardiovascular: Normal rate, regular rhythm and normal heart sounds.  No murmur heard. No BLE edema. Pulmonary/Chest: Effort normal and breath sounds normal. No respiratory distress. She has no wheezes.  Abdominal: Soft. Bowel sounds are normal. She exhibits no distension. There is no tenderness. no masses GU/breast: defer to gyn Musculoskeletal: Normal range of motion, no joint effusions. No gross deformities Neurological: She is alert and oriented to person, place, and time. No cranial nerve deficit. Coordination, balance, strength, speech  and gait are normal.  Skin: Skin is warm and dry. No rash noted. No erythema.  Psychiatric: She has a normal mood and affect. Her behavior is normal. Judgment and thought content normal.    Lab Results  Component Value Date   WBC 5.1 07/30/2013   HGB 14.7 07/30/2013   HCT 43 07/30/2013   PLT 246 07/30/2013   GLUCOSE 91  06/28/2012   CHOL 204* 06/28/2012   TRIG 83.0 06/28/2012   HDL 42.90 06/28/2012   LDLDIRECT 146.6 06/28/2012   LDLCALC 138* 04/18/2011   ALT 13 07/30/2013   AST 25 07/30/2013   NA 141 07/30/2013   K 4.6 07/30/2013   CL 101 06/28/2012   CREATININE 0.6 07/30/2013   BUN 21 07/30/2013   CO2 31 06/28/2012   TSH 0.79 06/28/2012   HGBA1C 5.4 04/17/2011    Ct Abdomen Pelvis W Contrast  08/07/2013   CLINICAL DATA:  Periumbilical pain for 4 weeks. Constipation. History of bladder surgery from [facility]. Hysterectomy.  EXAM: CT ABDOMEN AND PELVIS WITH CONTRAST  TECHNIQUE: Multidetector CT imaging of the abdomen and pelvis was performed using the standard protocol following bolus administration of intravenous contrast.  CONTRAST:  134mL OMNIPAQUE IOHEXOL 300 MG/ML  SOLN  COMPARISON:  None.  FINDINGS: Lung bases are clear. No pericardial fluid. There is a low-density lesion in the right hepatic lobe measuring 9 mm which does not have simple fluid attenuation but cannot be further characterized. This lesion is unchanged from CT of 05/30/2003. The gallbladder, pancreas, adrenal glands are normal. There are multiple granulomata within the spleen. The kidneys are normal.  The stomach, small bowel, appendix, cecum normal. The colon and rectosigmoid colon are normal. There are several diverticula of the sigmoid colon without acute inflammation.  There is no significant umbilical hernia. Tiny left inguinal hernia.  Abdominal or is normal caliber. No retroperitoneal periportal lymphadenopathy. . No free fluid the pelvis. Post hysterectomy anatomy. Postsurgical change at the bladder neck. No pelvic lymphadenopathy. Adnexa are normal. No aggressive osseous lesion.  IMPRESSION: 1. No explanation for periumbilical pain. No ventral hernia or significant inguinal hernia. 2. Normal volume of stool. 3. Normal appendix. 4. Post hysterectomy.   Electronically Signed   By: Suzy Bouchard M.D.   On: 08/07/2013 16:37       Assessment &  Plan:   CPX/AWV/v70.0 - Today patient counseled on age appropriate routine health concerns for screening and prevention, each reviewed and up to date or declined. Immunizations reviewed and up to date or declined. Labs ordered and reviewed. Risk factors for depression reviewed and negative. Hearing function and visual acuity are intact. ADLs screened and addressed as needed. Functional ability and level of safety reviewed and appropriate. Education, counseling and referrals performed based on assessed risks today. Patient provided with a copy of personalized plan for preventive services.

## 2013-09-20 NOTE — Progress Notes (Signed)
Pre visit review using our clinic review tool, if applicable. No additional management support is needed unless otherwise documented below in the visit note. 

## 2013-09-20 NOTE — Patient Instructions (Addendum)
It was good to see you today.  We have reviewed your prior records including labs and tests today  Health Maintenance reviewed - all recommended immunizations and age-appropriate screenings are up-to-date.  Test(s) ordered today. Return when you are fasting. Your results will be released to [place] (or called to you) after review, usually within 72hours after test completion. If any changes need to be made, you will be notified at that same time.  Medications reviewed and updated, no changes recommended at this time.  Please schedule followup in 12 months for annual exam and labs, call sooner if problems.  Health Maintenance, Female A healthy lifestyle and preventative care can promote health and wellness.  Maintain regular health, dental, and eye exams.  Eat a healthy diet. Foods like vegetables, fruits, whole grains, low-fat dairy products, and lean protein foods contain the nutrients you need without too many calories. Decrease your intake of foods high in solid fats, added sugars, and salt. Get information about a proper diet from your caregiver, if necessary.  Regular physical exercise is one of the most important things you can do for your health. Most adults should get at least 150 minutes of moderate-intensity exercise (any activity that increases your heart rate and causes you to sweat) each week. In addition, most adults need muscle-strengthening exercises on 2 or more days a week.   Maintain a healthy weight. The body mass index (BMI) is a screening tool to identify possible weight problems. It provides an estimate of body fat based on height and weight. Your caregiver can help determine your BMI, and can help you achieve or maintain a healthy weight. For adults 20 years and older:  A BMI below 18.5 is considered underweight.  A BMI of 18.5 to 24.9 is normal.  A BMI of 25 to 29.9 is considered overweight.  A BMI of 30 and above is considered obese.  Maintain normal blood  lipids and cholesterol by exercising and minimizing your intake of saturated fat. Eat a balanced diet with plenty of fruits and vegetables. Blood tests for lipids and cholesterol should begin at age 44 and be repeated every 5 years. If your lipid or cholesterol levels are high, you are over 50, or you are a high risk for heart disease, you may need your cholesterol levels checked more frequently.Ongoing high lipid and cholesterol levels should be treated with medicines if diet and exercise are not effective.  If you smoke, find out from your caregiver how to quit. If you do not use tobacco, do not start.  Lung cancer screening is recommended for adults aged 78-80 years who are at high risk for developing lung cancer because of a history of smoking. Yearly low-dose computed tomography (CT) is recommended for people who have at least a 30-pack-year history of smoking and are a current smoker or have quit within the past 15 years. A pack year of smoking is smoking an average of 1 pack of cigarettes a day for 1 year (for example: 1 pack a day for 30 years or 2 packs a day for 15 years). Yearly screening should continue until the smoker has stopped smoking for at least 15 years. Yearly screening should also be stopped for people who develop a health problem that would prevent them from having lung cancer treatment.  If you are pregnant, do not drink alcohol. If you are breastfeeding, be very cautious about drinking alcohol. If you are not pregnant and choose to drink alcohol, do not exceed 1 drink  per day. One drink is considered to be 12 ounces (355 mL) of beer, 5 ounces (148 mL) of wine, or 1.5 ounces (44 mL) of liquor.  Avoid use of street drugs. Do not share needles with anyone. Ask for help if you need support or instructions about stopping the use of drugs.  High blood pressure causes heart disease and increases the risk of stroke. Blood pressure should be checked at least every 1 to 2 years. Ongoing  high blood pressure should be treated with medicines, if weight loss and exercise are not effective.  If you are 73 to 73 years old, ask your caregiver if you should take aspirin to prevent strokes.  Diabetes screening involves taking a blood sample to check your fasting blood sugar level. This should be done once every 3 years, after age 63, if you are within normal weight and without risk factors for diabetes. Testing should be considered at a younger age or be carried out more frequently if you are overweight and have at least 1 risk factor for diabetes.  Breast cancer screening is essential preventative care for women. You should practice "breast self-awareness." This means understanding the normal appearance and feel of your breasts and may include breast self-examination. Any changes detected, no matter how small, should be reported to a caregiver. Women in their 77s and 30s should have a clinical breast exam (CBE) by a caregiver as part of a regular health exam every 1 to 3 years. After age 68, women should have a CBE every year. Starting at age 79, women should consider having a mammogram (breast X-ray) every year. Women who have a family history of breast cancer should talk to their caregiver about genetic screening. Women at a high risk of breast cancer should talk to their caregiver about having an MRI and a mammogram every year.  Breast cancer gene (BRCA)-related cancer risk assessment is recommended for women who have family members with BRCA-related cancers. BRCA-related cancers include breast, ovarian, tubal, and peritoneal cancers. Having family members with these cancers may be associated with an increased risk for harmful changes (mutations) in the breast cancer genes BRCA1 and BRCA2. Results of the assessment will determine the need for genetic counseling and BRCA1 and BRCA2 testing.  The Pap test is a screening test for cervical cancer. Women should have a Pap test starting at age 59.  Between ages 57 and 40, Pap tests should be repeated every 2 years. Beginning at age 41, you should have a Pap test every 3 years as long as the past 3 Pap tests have been normal. If you had a hysterectomy for a problem that was not cancer or a condition that could lead to cancer, then you no longer need Pap tests. If you are between ages 68 and 24, and you have had normal Pap tests going back 10 years, you no longer need Pap tests. If you have had past treatment for cervical cancer or a condition that could lead to cancer, you need Pap tests and screening for cancer for at least 20 years after your treatment. If Pap tests have been discontinued, risk factors (such as a new sexual partner) need to be reassessed to determine if screening should be resumed. Some women have medical problems that increase the chance of getting cervical cancer. In these cases, your caregiver may recommend more frequent screening and Pap tests.  The human papillomavirus (HPV) test is an additional test that may be used for cervical cancer screening. The  HPV test looks for the virus that can cause the cell changes on the cervix. The cells collected during the Pap test can be tested for HPV. The HPV test could be used to screen women aged 56 years and older, and should be used in women of any age who have unclear Pap test results. After the age of 55, women should have HPV testing at the same frequency as a Pap test.  Colorectal cancer can be detected and often prevented. Most routine colorectal cancer screening begins at the age of 7 and continues through age 33. However, your caregiver may recommend screening at an earlier age if you have risk factors for colon cancer. On a yearly basis, your caregiver may provide home test kits to check for hidden blood in the stool. Use of a small camera at the end of a tube, to directly examine the colon (sigmoidoscopy or colonoscopy), can detect the earliest forms of colorectal cancer. Talk to  your caregiver about this at age 66, when routine screening begins. Direct examination of the colon should be repeated every 5 to 10 years through age 66, unless early forms of pre-cancerous polyps or small growths are found.  Hepatitis C blood testing is recommended for all people born from [birth year] through 1965 and any individual with known risks for hepatitis C.  Practice safe sex. Use condoms and avoid high-risk sexual practices to reduce the spread of sexually transmitted infections (STIs). Sexually active women aged 74 and younger should be checked for Chlamydia, which is a common sexually transmitted infection. Older women with new or multiple partners should also be tested for Chlamydia. Testing for other STIs is recommended if you are sexually active and at increased risk.  Osteoporosis is a disease in which the bones lose minerals and strength with aging. This can result in serious bone fractures. The risk of osteoporosis can be identified using a bone density scan. Women ages 71 and over and women at risk for fractures or osteoporosis should discuss screening with their caregivers. Ask your caregiver whether you should be taking a calcium supplement or vitamin D to reduce the rate of osteoporosis.  Menopause can be associated with physical symptoms and risks. Hormone replacement therapy is available to decrease symptoms and risks. You should talk to your caregiver about whether hormone replacement therapy is right for you.  Use sunscreen. Apply sunscreen liberally and repeatedly throughout the day. You should seek shade when your shadow is shorter than you. Protect yourself by wearing long sleeves, pants, a wide-brimmed hat, and sunglasses year round, whenever you are outdoors.  Notify your caregiver of new moles or changes in moles, especially if there is a change in shape or color. Also notify your caregiver if a mole is larger than the size of a pencil eraser.  Stay current with your  immunizations. Document Released: 10/04/2010 Document Revised: 07/16/2012 Document Reviewed: 02/20/2013 [place] [facility] Patient Information 2015 [place], [place]. This information is not intended to replace advice given to you by your health care provider. Make sure you discuss any questions you have with your health care provider.

## 2013-09-20 NOTE — Assessment & Plan Note (Signed)
Chronic history of same Somewhat improved with herbal therapy and probiotic Symptoms unimproved with trial Linzess (caused blood in stool per report) Has been seen by both [facility] and [facility], feels no other eval/tx needed at this time

## 2013-10-03 ENCOUNTER — Other Ambulatory Visit (INDEPENDENT_AMBULATORY_CARE_PROVIDER_SITE_OTHER): Payer: Medicare Other

## 2013-10-03 ENCOUNTER — Encounter: Payer: Self-pay | Admitting: Internal Medicine

## 2013-10-03 DIAGNOSIS — I10 Essential (primary) hypertension: Secondary | ICD-10-CM

## 2013-10-03 DIAGNOSIS — Z0000 Encounter for general adult medical examination without abnormal findings: Secondary | ICD-10-CM

## 2013-10-03 DIAGNOSIS — E785 Hyperlipidemia, unspecified: Secondary | ICD-10-CM

## 2013-10-03 LAB — CBC WITH DIFFERENTIAL/PLATELET
Basophils Absolute: 0 10*3/uL (ref 0.0–0.1)
Basophils Relative: 0.9 % (ref 0.0–3.0)
Eosinophils Absolute: 0.2 10*3/uL (ref 0.0–0.7)
Eosinophils Relative: 4.7 % (ref 0.0–5.0)
HCT: 42.8 % (ref 36.0–46.0)
HEMOGLOBIN: 14.6 g/dL (ref 12.0–15.0)
LYMPHS ABS: 1.6 10*3/uL (ref 0.7–4.0)
Lymphocytes Relative: 34.3 % (ref 12.0–46.0)
MCHC: 34.2 g/dL (ref 30.0–36.0)
MCV: 85.9 fl (ref 78.0–100.0)
MONOS PCT: 7.5 % (ref 3.0–12.0)
Monocytes Absolute: 0.4 10*3/uL (ref 0.1–1.0)
Neutro Abs: 2.5 10*3/uL (ref 1.4–7.7)
Neutrophils Relative %: 52.6 % (ref 43.0–77.0)
PLATELETS: 229 10*3/uL (ref 150.0–400.0)
RBC: 4.98 Mil/uL (ref 3.87–5.11)
RDW: 13 % (ref 11.5–15.5)
WBC: 4.7 10*3/uL (ref 4.0–10.5)

## 2013-10-03 LAB — LIPID PANEL
CHOL/HDL RATIO: 4
CHOLESTEROL: 199 mg/dL (ref 0–200)
HDL: 54.6 mg/dL (ref >39.00)
LDL Cholesterol: 132 mg/dL — ABNORMAL HIGH (ref 0–99)
NonHDL: 144.4
Triglycerides: 62 mg/dL (ref 0.0–149.0)
VLDL: 12.4 mg/dL (ref 0.0–40.0)

## 2013-10-03 LAB — HEPATIC FUNCTION PANEL
ALT: 16 U/L (ref 0–35)
AST: 28 U/L (ref 0–37)
Albumin: 4 g/dL (ref 3.5–5.2)
Alkaline Phosphatase: 63 U/L (ref 39–117)
Bilirubin, Direct: 0.1 mg/dL (ref 0.0–0.3)
Total Bilirubin: 0.8 mg/dL (ref 0.2–1.2)
Total Protein: 6.6 g/dL (ref 6.0–8.3)

## 2013-10-03 LAB — URINALYSIS, ROUTINE W REFLEX MICROSCOPIC
BILIRUBIN URINE: NEGATIVE
Ketones, ur: NEGATIVE
NITRITE: NEGATIVE
PH: 6 (ref 5.0–8.0)
Specific Gravity, Urine: 1.025 (ref 1.000–1.030)
Total Protein, Urine: 30 — AB
Urine Glucose: NEGATIVE
Urobilinogen, UA: 0.2 (ref 0.0–1.0)

## 2013-10-03 LAB — BASIC METABOLIC PANEL
BUN: 17 mg/dL (ref 6–23)
CO2: 30 mEq/L (ref 19–32)
Calcium: 9.2 mg/dL (ref 8.4–10.5)
Chloride: 106 mEq/L (ref 96–112)
Creatinine, Ser: 0.7 mg/dL (ref 0.4–1.2)
GFR: 90.25 mL/min (ref >60.00)
Glucose, Bld: 91 mg/dL (ref 70–99)
POTASSIUM: 4 meq/L (ref 3.5–5.1)
SODIUM: 141 meq/L (ref 135–145)

## 2013-10-03 LAB — TSH: TSH: 1.58 u[IU]/mL (ref 0.35–4.50)

## 2013-10-18 ENCOUNTER — Telehealth: Payer: Self-pay | Admitting: Internal Medicine

## 2013-10-18 NOTE — Telephone Encounter (Signed)
Patient is going on vacation the first on the month and will be on the water for three days.  She is requesting Dr. Asa Lente to call in a script for scoploamine for motion sickness

## 2013-10-24 ENCOUNTER — Telehealth: Payer: Self-pay | Admitting: Internal Medicine

## 2013-10-24 MED ORDER — SCOPOLAMINE 1 MG/3DAYS TD PT72: 1.0000 | MEDICATED_PATCH | TRANSDERMAL | Status: DC

## 2013-10-24 NOTE — Telephone Encounter (Signed)
erx done

## 2013-10-24 NOTE — Telephone Encounter (Signed)
Called pt back only have a 1:15 with dr. Alain Marion pt accepted appt...Joan Welch

## 2013-10-24 NOTE — Telephone Encounter (Signed)
Patient Information:  Caller Name: Marrisa  Phone: [phone]  Patient: Joan Welch, Joan Welch  Gender: Female  DOB: 1940/11/10  Age: 73 Years  PCP: Gwendolyn Grant (Adults only)  Office Follow Up:  Does the office need to follow up with this patient?: Yes  Instructions For The Office: She is requesting an appointment to be seen for new onset of headache, front above left eye (not her typical migraine) and to rule out any issues (Sinus) before she gets on plane next week. She will be able to come to office today, 10/24/2013 or tomorrow, please advise what office has an opening, RN/CAN did not see any.  RN Note:  Onset 10/10/2013 MODERATE  headache in front, above the left eyebrown not responding to home care: yoga, Aleve, Tylenol, hiking. She just saw the eye doctor today, 10/24/2013 who ruled out any occular issues. She does not wake up with the headache and BP in the 130/70's.  Symptoms  Reason For Call & Symptoms: Headache for the last 2 weeks and going to fly wants to know if this is caffeine headache (withdrawel) or Sinus  Reviewed Health History In EMR: Yes  Reviewed Medications In EMR: Yes  Reviewed Allergies In EMR: Yes  Reviewed Surgeries / Procedures: Yes  Date of Onset of Symptoms: 10/10/2013  Treatments Tried: Yoga, Aleve, Tylenol, hiking, hydration  Treatments Tried Worked: No  Guideline(s) Used:  Headache  Disposition Per Guideline:   See Today or Tomorrow in Office  Reason For Disposition Reached:   Unexplained headache that is present > 24 hours  Advice Given:  Reassurance - Migraine Headache:  You have told me that this headache is similar to previous migraine headaches that you have had. If the pattern or severity of your headache changes, you will need to see your physician.  Reassurance - Muscle Tension Headache:  You have told me that this headache is similar to your previously diagnosed muscle tension headaches.  The majority of headaches are caused by muscle  tension.  Call Back If:  Headache lasts longer than 24 hours  You become worse.  Stretching:   Stretch and massage any tight neck muscles.  Apply Cold to the Area:   Apply a cold wet washcloth or cold pack to the forehead for 20 minutes.  Rest:   Lie down in a dark, quiet place and try to relax. Close your eyes and imagine your entire body relaxing.  Patient Will Follow Care Advice:  YES

## 2013-10-25 ENCOUNTER — Ambulatory Visit (INDEPENDENT_AMBULATORY_CARE_PROVIDER_SITE_OTHER): Payer: Medicare Other | Admitting: Internal Medicine

## 2013-10-25 ENCOUNTER — Other Ambulatory Visit (INDEPENDENT_AMBULATORY_CARE_PROVIDER_SITE_OTHER): Payer: Medicare Other

## 2013-10-25 ENCOUNTER — Encounter: Payer: Self-pay | Admitting: Internal Medicine

## 2013-10-25 VITALS — BP 160/72 | HR 76 | Temp 98.3°F | Resp 16 | Wt 117.0 lb

## 2013-10-25 DIAGNOSIS — E785 Hyperlipidemia, unspecified: Secondary | ICD-10-CM

## 2013-10-25 DIAGNOSIS — R51 Headache: Secondary | ICD-10-CM

## 2013-10-25 DIAGNOSIS — I10 Essential (primary) hypertension: Secondary | ICD-10-CM

## 2013-10-25 LAB — CBC WITH DIFFERENTIAL/PLATELET
BASOS PCT: 0.4 % (ref 0.0–3.0)
Basophils Absolute: 0 10*3/uL (ref 0.0–0.1)
EOS ABS: 0.2 10*3/uL (ref 0.0–0.7)
Eosinophils Relative: 3.8 % (ref 0.0–5.0)
HCT: 44 % (ref 36.0–46.0)
HEMOGLOBIN: 14.9 g/dL (ref 12.0–15.0)
Lymphocytes Relative: 36.8 % (ref 12.0–46.0)
Lymphs Abs: 2.1 10*3/uL (ref 0.7–4.0)
MCHC: 33.9 g/dL (ref 30.0–36.0)
MCV: 86.3 fl (ref 78.0–100.0)
MONO ABS: 0.4 10*3/uL (ref 0.1–1.0)
Monocytes Relative: 6.9 % (ref 3.0–12.0)
NEUTROS ABS: 2.9 10*3/uL (ref 1.4–7.7)
Neutrophils Relative %: 52.1 % (ref 43.0–77.0)
Platelets: 250 10*3/uL (ref 150.0–400.0)
RBC: 5.1 Mil/uL (ref 3.87–5.11)
RDW: 13 % (ref 11.5–15.5)
WBC: 5.6 10*3/uL (ref 4.0–10.5)

## 2013-10-25 LAB — SEDIMENTATION RATE: Sed Rate: 11 mm/hr (ref 0–22)

## 2013-10-25 MED ORDER — IBUPROFEN 600 MG PO TABS: 600.0000 mg | ORAL_TABLET | Freq: Three times a day (TID) | ORAL | Status: DC | PRN

## 2013-10-25 MED ORDER — ATENOLOL 25 MG PO TABS: 12.5000 mg | ORAL_TABLET | Freq: Every day | ORAL | Status: DC

## 2013-10-25 MED ORDER — BUTALBITAL-ACETAMINOPHEN 50-300 MG PO TABS: 1.0000 | ORAL_TABLET | Freq: Two times a day (BID) | ORAL | Status: DC | PRN

## 2013-10-25 NOTE — Assessment & Plan Note (Addendum)
Start Atenolol if elevated BP

## 2013-10-25 NOTE — Progress Notes (Signed)
Pre visit review using our clinic review tool, if applicable. No additional management support is needed unless otherwise documented below in the visit note. 

## 2013-10-25 NOTE — Progress Notes (Signed)
   Subjective:     Headache  This is a new problem. The current episode started 1 to 4 weeks ago (7/9). The problem occurs daily. The pain is located in the frontal (around L eye) region. The pain radiates to the face. The pain quality is not similar to prior headaches. The quality of the pain is described as dull, band-like and boring. The pain is at a severity of 8/10. The pain is moderate. Associated symptoms include eye pain. Pertinent negatives include no eye redness, fever, hearing loss, nausea, photophobia, rhinorrhea, vomiting or weakness. The treatment provided mild relief.  She is flying to [facility] on Mon    Review of Systems  Constitutional: Negative for fever and fatigue.  HENT: Negative for hearing loss, postnasal drip, rhinorrhea, sneezing and trouble swallowing.   Eyes: Positive for pain. Negative for photophobia, discharge, redness and visual disturbance.  Gastrointestinal: Negative for nausea and vomiting.  Neurological: Positive for headaches. Negative for tremors and weakness.   BP Readings from Last 3 Encounters:  10/25/13 160/72  09/20/13 122/70  08/01/13 128/66       Objective:   Physical Exam  Constitutional: She appears well-developed. No distress.  HENT:  Head: Normocephalic.  Right Ear: External ear normal.  Left Ear: External ear normal.  Nose: Nose normal.  Mouth/Throat: Oropharynx is clear and moist.  Eyes: Conjunctivae are normal. Pupils are equal, round, and reactive to light. Right eye exhibits no discharge. Left eye exhibits no discharge.  Neck: Normal range of motion. Neck supple. No JVD present. No tracheal deviation present. No thyromegaly present.  Cardiovascular: Normal rate, regular rhythm and normal heart sounds.   Pulmonary/Chest: No stridor. No respiratory distress. She has no wheezes.  Abdominal: Soft. Bowel sounds are normal. She exhibits no distension and no mass. There is no tenderness. There is no rebound and no guarding.    Musculoskeletal: She exhibits no edema and no tenderness.  Lymphadenopathy:    She has no cervical adenopathy.  Neurological: She displays normal reflexes. No cranial nerve deficit. She exhibits normal muscle tone. Coordination normal.  Skin: No rash noted. No erythema.  Psychiatric: She has a normal mood and affect. Her behavior is normal. Judgment and thought content normal.  scalp - NT  Lab Results  Component Value Date   WBC 5.6 10/25/2013   HGB 14.9 10/25/2013   HCT 44.0 10/25/2013   PLT 250.0 10/25/2013   GLUCOSE 91 10/03/2013   CHOL 199 10/03/2013   TRIG 62.0 10/03/2013   HDL 54.60 10/03/2013   LDLDIRECT 146.6 06/28/2012   LDLCALC 132* 10/03/2013   ALT 16 10/03/2013   AST 28 10/03/2013   NA 141 10/03/2013   K 4.0 10/03/2013   CL 106 10/03/2013   CREATININE 0.7 10/03/2013   BUN 17 10/03/2013   CO2 30 10/03/2013   TSH 1.58 10/03/2013   HGBA1C 5.4 04/17/2011         Assessment & Plan:

## 2013-10-25 NOTE — Assessment & Plan Note (Addendum)
Labs, ESR Bupap prn, Ibuprofen Rx Treat HTN if needed - Atenolol (monitor BP) CT if not better

## 2013-10-25 NOTE — Patient Instructions (Signed)
Start Atenolol if elevated BP

## 2013-10-26 NOTE — Assessment & Plan Note (Signed)
Treat BP if elevated

## 2013-10-31 ENCOUNTER — Telehealth: Payer: Self-pay

## 2013-10-31 NOTE — Telephone Encounter (Signed)
Received a fax from [place] stating prior authorization for Bupap 50mg -300mg  has been approved.

## 2013-11-14 ENCOUNTER — Ambulatory Visit (INDEPENDENT_AMBULATORY_CARE_PROVIDER_SITE_OTHER): Payer: Medicare Other | Admitting: Internal Medicine

## 2013-11-14 ENCOUNTER — Encounter: Payer: Self-pay | Admitting: Internal Medicine

## 2013-11-14 VITALS — BP 140/72 | HR 64 | Temp 98.1°F | Ht 61.0 in | Wt 119.0 lb

## 2013-11-14 DIAGNOSIS — N318 Other neuromuscular dysfunction of bladder: Secondary | ICD-10-CM

## 2013-11-14 DIAGNOSIS — N3281 Overactive bladder: Secondary | ICD-10-CM

## 2013-11-14 DIAGNOSIS — I10 Essential (primary) hypertension: Secondary | ICD-10-CM

## 2013-11-14 DIAGNOSIS — R51 Headache: Secondary | ICD-10-CM

## 2013-11-14 NOTE — Progress Notes (Signed)
Subjective:    Patient ID: Joan Welch, female    DOB: December 02, 1940, 73 y.o.   MRN: [id number]  HPI  Patient is here for follow up - HA improved, home BP log reviewed, resumed OAB rx Also reviewed chronic medical issues and interval medical events  Past Medical History  Diagnosis Date  . Glaucoma   . IBS (irritable bowel syndrome)   . H. pylori infection   . Headache(784.0)   . Hyperlipidemia   . Hypertension   . OAB (overactive bladder)   . Arthritis   . Diverticulosis   . BCC (basal cell carcinoma)     Review of Systems  Constitutional: Negative for fever and fatigue.  Eyes: Negative for visual disturbance.  Respiratory: Negative for cough and shortness of breath.   Neurological: Positive for headaches (1-2/mo since starting ibuprofen, less severe - located frontal sinus region B). Negative for facial asymmetry, speech difficulty, weakness and light-headedness.       Objective:   Physical Exam  BP 140/72  Pulse 64  Temp(Src) 98.1 F (36.7 C) (Oral)  Ht 5\' 1"  (1.549 m)  Wt 119 lb (53.978 kg)  BMI 22.50 kg/m2  SpO2 97% Wt Readings from Last 3 Encounters:  11/14/13 119 lb (53.978 kg)  10/25/13 117 lb (53.071 kg)  09/20/13 119 lb 1.9 oz (54.032 kg)   Constitutional: She appears well-developed and well-nourished. No distress.  Cardiovascular: Normal rate, regular rhythm and normal heart sounds.  No murmur heard. No BLE edema. Pulmonary/Chest: Effort normal and breath sounds normal. No respiratory distress. She has no wheezes.  Psychiatric: She has a normal mood and affect. Her behavior is normal. Judgment and thought content normal.   Lab Results  Component Value Date   WBC 5.6 10/25/2013   HGB 14.9 10/25/2013   HCT 44.0 10/25/2013   PLT 250.0 10/25/2013   GLUCOSE 91 10/03/2013   CHOL 199 10/03/2013   TRIG 62.0 10/03/2013   HDL 54.60 10/03/2013   LDLDIRECT 146.6 06/28/2012   LDLCALC 132* 10/03/2013   ALT 16 10/03/2013   AST 28 10/03/2013   NA 141 10/03/2013   K 4.0  10/03/2013   CL 106 10/03/2013   CREATININE 0.7 10/03/2013   BUN 17 10/03/2013   CO2 30 10/03/2013   TSH 1.58 10/03/2013   HGBA1C 5.4 04/17/2011    Ct Abdomen Pelvis W Contrast  08/07/2013   CLINICAL DATA:  Periumbilical pain for 4 weeks. Constipation. History of bladder surgery from [place]. Hysterectomy.  EXAM: CT ABDOMEN AND PELVIS WITH CONTRAST  TECHNIQUE: Multidetector CT imaging of the abdomen and pelvis was performed using the standard protocol following bolus administration of intravenous contrast.  CONTRAST:  123mL OMNIPAQUE IOHEXOL 300 MG/ML  SOLN  COMPARISON:  None.  FINDINGS: Lung bases are clear. No pericardial fluid. There is a low-density lesion in the right hepatic lobe measuring 9 mm which does not have simple fluid attenuation but cannot be further characterized. This lesion is unchanged from CT of 05/30/2003. The gallbladder, pancreas, adrenal glands are normal. There are multiple granulomata within the spleen. The kidneys are normal.  The stomach, small bowel, appendix, cecum normal. The colon and rectosigmoid colon are normal. There are several diverticula of the sigmoid colon without acute inflammation.  There is no significant umbilical hernia. Tiny left inguinal hernia.  Abdominal or is normal caliber. No retroperitoneal periportal lymphadenopathy. . No free fluid the pelvis. Post hysterectomy anatomy. Postsurgical change at the bladder neck. No pelvic lymphadenopathy. Adnexa are normal. No  aggressive osseous lesion.  IMPRESSION: 1. No explanation for periumbilical pain. No ventral hernia or significant inguinal hernia. 2. Normal volume of stool. 3. Normal appendix. 4. Post hysterectomy.   Electronically Signed   By: Suzy Bouchard M.D.   On: 08/07/2013 16:37       Assessment & Plan:   Problem List Items Addressed This Visit   Headache(784.0)     OV last mo for same reviewed - symptoms have improved with ibuprofen prn - not using BuPAP or atenolol No concerning features on hx or  exam Pt to call if increase/change in freq or intensity of symptoms for further eval as needed - no tx changes recommended today    HYPERTENSION - Primary      Improved on low dose ARB -but higher dose exacerbates BPPV symptoms - Added HCTZ 05/2011 and saw cards 11/2011 - Changed to low dose beta-blocker 10/2013 because of headache, but declined to start same as home BP log with SBP range 110-140 The current treatment appears effective;  continue present plan and medications.   BP Readings from Last 3 Encounters:  11/14/13 140/72  10/25/13 160/72  09/20/13 122/70      OAB (overactive bladder)     Evaluation by urology Dr. Vikki Ports and 2013 reviewed Prior ineffective response to PTNS, Myrbertriq or oxytrol patch - in interval pt has retired and eliminated caffeine from diet -  Has resumed Myrbetiq in past 2 weeks fr new trial - will watch BP and follow up with uro on effectiveness of med tx

## 2013-11-14 NOTE — Patient Instructions (Signed)
It was good to see you today.  We have reviewed your prior records including labs and tests today  Medications reviewed and updated, no changes recommended at this time.  Please schedule followup in 6 months, call sooner if problems.

## 2013-11-14 NOTE — Progress Notes (Signed)
Pre visit review using our clinic review tool, if applicable. No additional management support is needed unless otherwise documented below in the visit note. 

## 2013-11-15 NOTE — Assessment & Plan Note (Signed)
OV last mo for same reviewed - symptoms have improved with ibuprofen prn - not using BuPAP or atenolol No concerning features on hx or exam Pt to call if increase/change in freq or intensity of symptoms for further eval as needed - no tx changes recommended today

## 2013-11-15 NOTE — Assessment & Plan Note (Signed)
Evaluation by urology Dr. Vikki Ports and 2013 reviewed Prior ineffective response to PTNS, Myrbertriq or oxytrol patch - in interval pt has retired and eliminated caffeine from diet -  Has resumed Myrbetiq in past 2 weeks fr new trial - will watch BP and follow up with uro on effectiveness of med tx

## 2013-11-15 NOTE — Assessment & Plan Note (Signed)
Improved on low dose ARB -but higher dose exacerbates BPPV symptoms - Added HCTZ 05/2011 and saw cards 11/2011 - Changed to low dose beta-blocker 10/2013 because of headache, but declined to start same as home BP log with SBP range 110-140 The current treatment appears effective;  continue present plan and medications.   BP Readings from Last 3 Encounters:  11/14/13 140/72  10/25/13 160/72  09/20/13 122/70

## 2013-11-21 ENCOUNTER — Telehealth: Payer: Self-pay | Admitting: Internal Medicine

## 2013-11-21 DIAGNOSIS — R51 Headache: Secondary | ICD-10-CM

## 2013-11-21 NOTE — Telephone Encounter (Signed)
Patient came in on 11-14-13 for headaches and is wanting to come back in to See Dr. Asa Lente for another appointment concerning her headache. Advised patient that the next OV available with her PCP would be 01-16-14. She is not wanting to wait this long and only wants to see her provider. Wants to know what she should do and if she could be referred to a specialist for her headache. Please advise patient.

## 2013-11-22 NOTE — Telephone Encounter (Signed)
Thanks for note Refer to neuro done - [facility] will arrange

## 2013-11-25 ENCOUNTER — Encounter: Payer: Self-pay | Admitting: Neurology

## 2013-11-25 ENCOUNTER — Ambulatory Visit (INDEPENDENT_AMBULATORY_CARE_PROVIDER_SITE_OTHER): Payer: Medicare Other | Admitting: Neurology

## 2013-11-25 ENCOUNTER — Telehealth: Payer: Self-pay | Admitting: Neurology

## 2013-11-25 VITALS — BP 104/66 | HR 66 | Temp 98.3°F | Resp 16 | Ht 61.0 in | Wt 118.0 lb

## 2013-11-25 DIAGNOSIS — G4452 New daily persistent headache (NDPH): Secondary | ICD-10-CM

## 2013-11-25 DIAGNOSIS — G43019 Migraine without aura, intractable, without status migrainosus: Secondary | ICD-10-CM

## 2013-11-25 LAB — BUN: BUN: 17 mg/dL (ref 6–23)

## 2013-11-25 LAB — CREATININE, SERUM: Creat: 0.52 mg/dL (ref 0.50–1.10)

## 2013-11-25 MED ORDER — DICLOFENAC POTASSIUM 50 MG PO TABS: 50.0000 mg | ORAL_TABLET | Freq: Three times a day (TID) | ORAL | Status: DC

## 2013-11-25 NOTE — Addendum Note (Signed)
Addended by: Charyl Bigger E on: 11/25/2013 01:08 PM   Modules accepted: Orders

## 2013-11-25 NOTE — Patient Instructions (Signed)
You likely are experiencing chronic migraines, possibly triggered by caffeine withdrawal 1.  To be sure there is nothing else going on, we will get MRI of the brain with and without contrast. 2.  I will refer you to Dr. Hulan Saas for osteopathic manipulative treatment as a non-pharmacologic therapy for chronic headache. 3.  Remember to limit use of pain relievers to no more than 2 days out of the week. 4.  Follow up in 3 months.

## 2013-11-25 NOTE — Progress Notes (Addendum)
NEUROLOGY CONSULTATION NOTE  Joan Welch MRN: [id number] DOB: 1940-07-28  Referring provider: Dr. Asa Lente Primary care provider: Dr. Asa Lente  Reason for consult:  Headache.  HISTORY OF PRESENT ILLNESS: Joan Welch is a 73 year old right-handed woman with overactive bladder, glaucoma, IBS and hyperlipidemia who presents for headache.  Records reviewed.  Onset:  On 09/17/13, she developed a headache, which resolved after a couple of days.  She had a history of overactive bladder, which was progressing.  She was started on Myrbetriq.  At the same time, she decided to stop caffeine.  This was on 10/10/13.  Beginning on 10/11/13, she developed the headache again, which has since persisted.  Around this time, she was started on timolol drops for glaucoma.  The headache persisted, despite discontinuing Myrbetriq.  Around this time, her blood pressure in July was 160/72 (usually 120s/70s). Location:  Above left eye, sometimes travels to the right frontal region.  She denies neck pain. Quality:  Non-throbbing, vice-like Intensity:  8/10 Aura:  no Prodrome:  no Associated symptoms:  Mild nausea.  No vomiting, photophobia, phonophobia, rhinorrhea, conjunctival lacrimation, visual disturbance Duration:  constant Frequency:  constant Triggers/exacerbating factors:  none Relieving factors:  sleeping Activity:  Usually able to function.  On 3 occasions this past week, she absolutely needed to lay down to sleep.  Past abortive therapy:  ASA (ineffective), naproxen (ineffective), ibuprofen (ineffective.  Was taking it 6-8 times a month, last took it about a week ago), yoga (ineffective) Past preventative therapy:  none  Current abortive therapy:  none Current preventative therapy:  none  Caffeine:  Stopped in early July Alcohol:  occasionally Smoker:  no Diet:  Pretty good Exercise:  Regular exercise.  Walks or goes to gym. Depression/stress:  She is a retired Pharmacist, hospital since 2011, so  no real stress Sleep hygiene:  Poor.  She falls asleep but wakes up in the middle of the night and cannot fall back asleep.  She usually lays in bed with racing thoughts Family history of headache:  No Other history:  She does have history of handful of migraines many years ago, associated with visual aura and severe headache.  She also has history of positional vertigo, which has been a little worse over the past 2 months.  10/25/13 LABS:  WBC 5.6, HGB 14.9, HCT 44, PLT 250, Sed Rate 11  PAST MEDICAL HISTORY: Past Medical History  Diagnosis Date  . Glaucoma   . IBS (irritable bowel syndrome)   . H. pylori infection   . Headache(784.0)   . Hyperlipidemia   . Hypertension   . OAB (overactive bladder)   . Arthritis   . Diverticulosis   . BCC (basal cell carcinoma)     PAST SURGICAL HISTORY: Past Surgical History  Procedure Laterality Date  . Bladder suspension  in [place]  . Abdominal hysterectomy  1998  . Breast biopsy  1997  . Colonoscopy    . Basal cell carcinoma excision  2013    MEDICATIONS: Current Outpatient Prescriptions on File Prior to Visit  Medication Sig Dispense Refill  . ibuprofen (ADVIL,MOTRIN) 600 MG tablet Take 1 tablet (600 mg total) by mouth every 8 (eight) hours as needed for headache.  60 tablet  0  . latanoprost (XALATAN) 0.005 % ophthalmic solution Place 1 drop into both eyes daily.      . naproxen sodium (ANAPROX) 220 MG tablet Take 220 mg by mouth as needed.      . Probiotic Product (  ALIGN) 4 MG CAPS Take 1 capsule by mouth as needed.      . timolol (BETIMOL) 0.5 % ophthalmic solution Place 1 drop into both eyes daily.      . mirabegron ER (MYRBETRIQ) 25 MG TB24 tablet Take 1 tablet (25 mg total) by mouth daily.  30 tablet     No current facility-administered medications on file prior to visit.    ALLERGIES: Allergies  Allergen Reactions  . Caffeine Nausea Only    Extreme nervousness     FAMILY HISTORY: Family History  Problem Relation  Age of Onset  . Diabetes Daughter     Type 1  . Breast cancer Sister   . Irritable bowel syndrome Mother   . Colon cancer Neg Hx   . Hypertension Sister     SOCIAL HISTORY: History   Social History  . Marital Status: Divorced    Spouse Name: N/A    Number of Children: 3  . Years of Education: N/A   Occupational History  . Retired    Social History Main Topics  . Smoking status: Never Smoker   . 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 divorced, lives alone  . Alcohol Use: Yes     Comment: Social  . Drug Use: No  . Sexual Activity: No   Other Topics Concern  . Not on file   Social History Narrative  . No narrative on file    REVIEW OF SYSTEMS: Constitutional: No fevers, chills, or sweats, no generalized fatigue, change in appetite Eyes: No visual changes, double vision, eye pain Ear, nose and throat: No hearing loss, ear pain, nasal congestion, sore throat Cardiovascular: No chest pain, palpitations Respiratory:  No shortness of breath at rest or with exertion, wheezes GastrointestinaI: No nausea, vomiting, diarrhea, abdominal pain, fecal incontinence Genitourinary:  No dysuria, urinary retention or frequency Musculoskeletal:  No neck pain, back pain Integumentary: No rash, pruritus, skin lesions Neurological: as above Psychiatric: No depression, insomnia, anxiety Endocrine: No palpitations, fatigue, diaphoresis, mood swings, change in appetite, change in weight, increased thirst Hematologic/Lymphatic:  No anemia, purpura, petechiae. Allergic/Immunologic: no itchy/runny eyes, nasal congestion, recent allergic reactions, rashes  PHYSICAL EXAM: Filed Vitals:   11/25/13 1055  BP: 104/66  Pulse: 66  Temp: 98.3 F (36.8 C)  Resp: 16   General: No acute distress Head:  Normocephalic/atraumatic Neck: supple, no  paraspinal tenderness, full range of motion Back: No paraspinal tenderness Heart: regular rate and rhythm Lungs: Clear to auscultation bilaterally. Vascular: No carotid bruits. Neurological Exam: Mental status: alert and oriented to person, place, and time, recent and remote memory intact, fund of knowledge intact, attention and concentration intact, speech fluent and not dysarthric, language intact. Cranial nerves: CN I: not tested CN II: pupils equal, round and reactive to light, visual fields intact, fundi unremarkable, without vessel changes, exudates, hemorrhages or papilledema. CN III, IV, VI:  full range of motion, no nystagmus, no ptosis CN V: facial sensation intact CN VII: upper and lower face symmetric CN VIII: hearing intact CN IX, X: gag intact, uvula midline CN XI: sternocleidomastoid and trapezius muscles intact CN XII: tongue midline Bulk & Tone: normal, no fasciculations. Motor: 5/5 throughout Sensation: temperature and vibration intact Deep Tendon Reflexes: 2+ throughout, toes downgoing. Finger to nose testing: no dysmetria Heel to shin: no dysmetria Gait: normal station and stride.  Able to turn and walk in tandem. Romberg negative.  IMPRESSION: New daily persistent headache.  With associated mild nausea and history of migraine, this may be migraine, possibly triggered by caffeine withdrawal  PLAN: She is interested in non-pharmacologic therapy. 1.  Refer to Dr. Hulan Saas for OMT 2.  If ineffective, consider cognitive behavioral therapy.  Otherwise, will have to consider a medication such as propranolol. 3.  As this is a new persistent headache, MRI of the brain with and without contrast to rule out alternative etiologies. 4.  For abortive therapy, will try diclofenac 50mg , may be taken up to three times daily as needed.  Should not take more than 2 days out of the week. 5.  Follow up in 3 months.  45 minutes spent with patient, over 50% spent counseling and  coordinating care.  Thank you for allowing me to take part in the care of this patient.  Metta Clines, DO  CC:  Gwendolyn Grant, MD

## 2013-11-25 NOTE — Addendum Note (Signed)
Addended by: Metta Clines R on: 11/25/2013 03:34 PM   Modules accepted: Orders

## 2013-11-25 NOTE — Telephone Encounter (Signed)
Pt has some question please call [phone]

## 2013-12-03 ENCOUNTER — Ambulatory Visit (INDEPENDENT_AMBULATORY_CARE_PROVIDER_SITE_OTHER): Payer: Medicare Other | Admitting: Family Medicine

## 2013-12-03 ENCOUNTER — Encounter: Payer: Self-pay | Admitting: Family Medicine

## 2013-12-03 VITALS — BP 122/74 | HR 54 | Ht 61.5 in | Wt 119.0 lb

## 2013-12-03 DIAGNOSIS — M9981 Other biomechanical lesions of cervical region: Secondary | ICD-10-CM

## 2013-12-03 DIAGNOSIS — M999 Biomechanical lesion, unspecified: Secondary | ICD-10-CM

## 2013-12-03 DIAGNOSIS — G43809 Other migraine, not intractable, without status migrainosus: Secondary | ICD-10-CM

## 2013-12-03 DIAGNOSIS — G43109 Migraine with aura, not intractable, without status migrainosus: Secondary | ICD-10-CM | POA: Insufficient documentation

## 2013-12-03 NOTE — Assessment & Plan Note (Signed)
There is a good possibility that patient's headache is secondary to musculoskeletal dysfunction. Patient has tried multiple different other modalities over the course of time. Patient is scheduled for an MRI to rule out any intracranial pathology that could be contributing. Patient has tried multiple different medications as well. We are going to try more of a natural support with different over-the-counter medications I think could be beneficial. Minority ruled out giant cell arteritis with normal ESR. Other differentials I do think could be helpful to continue to think about would be sleep apnea. No radicular symptoms down the arm and patient did not have any carotid bruits noted today. She is continuing to have difficulty I would consider further workup for sleep apnea as well as vascular compromise. Hopefully this will not be necessary.  Patient did respond well to osteopathic manipulation today. Patient was given home modalities I think will be beneficial, we discussed over-the-counter medications and to be beneficial as well. Patient will follow up with me again in 3 weeks for further evaluation and treatment.

## 2013-12-03 NOTE — Progress Notes (Signed)
[name] [place], [place] [address] Phone: [phone] Subjective:    I'm seeing this patient by the request  of:  Jaffe M.D.  CC: Persistent headaches  [id number] Joan Welch is a 73 y.o. female coming in with complaint of persistent headaches. Patient though had a headache going on 2 months overall. Patient states that she did not have any type of head trauma that she can remember. Patient states that she had been trying to do many different home remedies such as stopping caffeine, discontinuing certain medications, as well as been treated recently for glaucoma. Patient states it is persistent daily headache can be associated with mild nausea as well as some mild photophobia. Patient states that the intensity can get as severe as 8/10 and called and more of a vice-like a non-throbbing irritation. Patient has tried over-the-counter medications including aspirin, naproxen, as well as ibuprofen. Patient was sent to neurology for further evaluation. Patient has also tried nonpharmacologic approach including yoga with no improvement. Patient has been seen by neurology and did order an MRI. MRI is scheduled. Patient also had lab workup including sedimentation rate which was normal. No signs of infectious etiology either. Patient states that this seems to be in daily headache and she is scared that could be potentially getting worse overall.    Past medical history, social, surgical and family history all reviewed in electronic medical record.   Review of Systems: No headache, visual changes, nausea, vomiting, diarrhea, constipation, dizziness, abdominal pain, skin rash, fevers, chills, night sweats, weight loss, swollen lymph nodes, body aches, joint swelling, muscle aches, chest pain, shortness of breath, mood changes.   Objective Blood pressure 122/74, pulse 54, height 5' 1.5" (1.562 m), weight 119 lb (53.978 kg), SpO2 98.00%.  General: No  apparent distress alert and oriented x3 mood and affect normal, dressed appropriately.  HEENT: Pupils equal, extraocular movements intact  Respiratory: Patient's speak in full sentences and does not appear short of breath  Cardiovascular: No lower extremity edema, non tender, no erythema  Skin: Warm dry intact with no signs of infection or rash on extremities or on axial skeleton.  Abdomen: Soft nontender  Neuro: Cranial nerves II through XII are intact, neurovascularly intact in all extremities with 2+ DTRs and 2+ pulses.  Lymph: No lymphadenopathy of posterior or anterior cervical chain or axillae bilaterally.  Gait normal with good balance and coordination.  MSK:  Non tender with full range of motion and good stability and symmetric strength and tone of shoulders, elbows, wrist, hip, knee and ankles bilaterally.  Neck: Inspection reveals a patient does have increasing kyphosis of the upper thoracic spine No palpable stepoffs. Negative Spurling's maneuver. Full neck range of motion and does have some mild discomfort with extreme side bending and rotation. Grip strength and sensation normal in bilateral hands Strength good C4 to T1 distribution No sensory change to C4 to T1 Negative Hoffman sign bilaterally Reflexes normal  OMT Physical Exam  Standing structural       Occiput left eye  Shoulder right higher  Inferior angle of scapula right higher  Cervical  C2 flexed rotated and side bent left C5 flexed rotated and side bent right  Thoracic T3 extended rotated and side bent left T5 extended rotated and side bent right  Lumbar L2 flexed rotated and side bent right L4 flexed rotated and side bent left  Sacrum Left on left     Impression and Recommendations:  This case required medical decision making of moderate complexity.

## 2013-12-03 NOTE — Assessment & Plan Note (Signed)
Decision today to treat with OMT was based on Physical Exam  After verbal consent patient was treated with HVLA, ME techniques in cervical, thoracic and lumbar areas  Patient tolerated the procedure well with improvement in symptoms  Patient given exercises, stretches and lifestyle modifications  See medications in patient instructions if given  Patient will follow up in 3 weeks

## 2013-12-03 NOTE — Patient Instructions (Signed)
Very nice to meet you We will try a little different approach.  Try Vitamin D 1000 Iu daily Turmeric 500mg  twice daily.  Fish oil 2 grams daily.  On wall with heels, butt shoulder and head touching for goal of 5 minutes daily.  New exercises for the neck 3 times a week.  Tennis ball between shoulder blades with sitting Computer monitor at eye level.  When feeling like headache is coming on then lay on ground with 2 tennis balls duct tape together and lay on them until release is felt.  Come back in 3-4 weeks.

## 2013-12-11 ENCOUNTER — Ambulatory Visit (HOSPITAL_COMMUNITY)
Admission: RE | Admit: 2013-12-11 | Discharge: 2013-12-11 | Disposition: A | Discharge status: Home / Self Care | Payer: Medicare Other | Source: Ambulatory Visit | Attending: Neurology | Admitting: Neurology

## 2013-12-11 DIAGNOSIS — R51 Headache: Secondary | ICD-10-CM | POA: Insufficient documentation

## 2013-12-11 DIAGNOSIS — I6789 Other cerebrovascular disease: Secondary | ICD-10-CM | POA: Diagnosis not present

## 2013-12-11 DIAGNOSIS — D699 Hemorrhagic condition, unspecified: Secondary | ICD-10-CM | POA: Diagnosis not present

## 2013-12-11 DIAGNOSIS — L723 Sebaceous cyst: Secondary | ICD-10-CM | POA: Diagnosis not present

## 2013-12-11 DIAGNOSIS — G43019 Migraine without aura, intractable, without status migrainosus: Secondary | ICD-10-CM

## 2013-12-11 DIAGNOSIS — G319 Degenerative disease of nervous system, unspecified: Secondary | ICD-10-CM | POA: Diagnosis not present

## 2013-12-11 DIAGNOSIS — G4452 New daily persistent headache (NDPH): Secondary | ICD-10-CM

## 2013-12-11 MED ORDER — GADOBENATE DIMEGLUMINE 529 MG/ML IV SOLN
10.0000 mL | Freq: Once | INTRAVENOUS | Status: AC | PRN
  Administered 2013-12-11: 10 mL via INTRAVENOUS

## 2013-12-12 ENCOUNTER — Telehealth: Payer: Self-pay | Admitting: Neurology

## 2013-12-12 NOTE — Telephone Encounter (Signed)
Pt called/returning Dr. Georgie Chard call regarding her results. # [phone]

## 2013-12-12 NOTE — Telephone Encounter (Signed)
Discussed MRI results with patient.  It shows some small vessel disease but nothing to explain the headaches.  She is interested in biofeedback, so we will set her up for it.

## 2013-12-19 ENCOUNTER — Telehealth: Payer: Self-pay | Admitting: *Deleted

## 2013-12-19 NOTE — Telephone Encounter (Signed)
Please call patient back in reference to a referral to [facility]

## 2013-12-20 ENCOUNTER — Telehealth: Payer: Self-pay | Admitting: Neurology

## 2013-12-20 NOTE — Telephone Encounter (Signed)
Left message for patient to call office.  

## 2013-12-20 NOTE — Telephone Encounter (Signed)
Pt called requesting to received contact info for the [facility] therapy for her headaches that Dr. Tomi Likens was going to refer her to. She also is requesting a copy of her MRI. Please call pt [phone]

## 2013-12-24 ENCOUNTER — Encounter: Payer: Self-pay | Admitting: Family Medicine

## 2013-12-24 ENCOUNTER — Ambulatory Visit (INDEPENDENT_AMBULATORY_CARE_PROVIDER_SITE_OTHER): Payer: Medicare Other | Admitting: Family Medicine

## 2013-12-24 ENCOUNTER — Other Ambulatory Visit: Payer: Self-pay | Admitting: Internal Medicine

## 2013-12-24 VITALS — BP 118/72 | HR 61 | Ht 61.5 in | Wt 120.0 lb

## 2013-12-24 DIAGNOSIS — M9981 Other biomechanical lesions of cervical region: Secondary | ICD-10-CM

## 2013-12-24 DIAGNOSIS — G43809 Other migraine, not intractable, without status migrainosus: Secondary | ICD-10-CM

## 2013-12-24 DIAGNOSIS — M999 Biomechanical lesion, unspecified: Secondary | ICD-10-CM

## 2013-12-24 DIAGNOSIS — Z1231 Encounter for screening mammogram for malignant neoplasm of breast: Secondary | ICD-10-CM

## 2013-12-24 NOTE — Patient Instructions (Signed)
Good to see you You are doing great overall.  Exercises on wall.  Heel and butt touching.  Raise leg 6 inches and hold 2 seconds.  Down slow for count of 4 seconds.  1 set of 30 reps daily on both sides.  Sacroiliac Joint Mobilization and Rehab 1. Work on pretzel stretching, shoulder back and leg draped in front. 3-5 sets, 30 sec.. 2. hip abductor rotations. standing, hip flexion and rotation outward then inward. 3 sets, 15 reps. when can do comfortably, add ankle weights starting at 2 pounds.  3. cross over stretching - shoulder back to ground, same side leg crossover. 3-5 sets for 30 min..  4. rolling up and back knees to chest and rocking. 5. sacral tilt - 5 sets, hold for 5-10 seconds Continue the sheet 3 times a week Consider a raquetball behind shoulder when aching.  Otherwise you are doing great.  Come back in 3 weeks.

## 2013-12-24 NOTE — Assessment & Plan Note (Signed)
Decision today to treat with OMT was based on Physical Exam  After verbal consent patient was treated with HVLA, ME techniques in cervical, thoracic and lumbar areas  Patient tolerated the procedure well with improvement in symptoms  Patient given exercises, stretches and lifestyle modifications  See medications in patient instructions if given  Patient will follow up in 3 weeks

## 2013-12-24 NOTE — Assessment & Plan Note (Signed)
Patient is making some improvement with the conservative therapy. Encourage her to continue to do the exercises as well as the soft manipulation techniques when patient feel some discomfort. We discussed monitoring closely. We discussed other over-the-counter medications that could be beneficial. Patient was given other exercises today for more strengthening as well. Patient and will follow up again with me in 3 weeks.  Spent greater than 25 minutes with patient face-to-face and had greater than 50% of counseling including as described above in assessment and plan.

## 2013-12-24 NOTE — Progress Notes (Signed)
  [name] [place], [place] [address] Phone: [phone] Subjective:     CC: Persistent headaches follow up  [id number] Joan Welch is a 73 y.o. female coming in with complaint of persistent headaches. Patient though had a headache going on 2 months overall. Patient has been followed by multiple providers for this problem. Patient was seen by neurology and an MRI of the brain was ordered. These results were reviewed by me today. 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 Patient states though that since doing the manipulation in the over-the-counter medications other than the turmeric she has noticed significant improvement. Patient states that the frequency as well as the duration of the headaches is improved. Patient has been doing some of the soft manipulation techniques and was taught to her as well. Patient continues the home exercises very regularly. Has not needed the prescription medications as much. Overall patient is very happy with the results.    Past medical history, social, surgical and family history all reviewed in electronic medical record.   Review of Systems: No headache, visual changes, nausea, vomiting, diarrhea, constipation, dizziness, abdominal pain, skin rash, fevers, chills, night sweats, weight loss, swollen lymph nodes, body aches, joint swelling, muscle aches, chest pain, shortness of breath, mood changes.   Objective Blood pressure 118/72, pulse 61, height 5' 1.5" (1.562 m), weight 120 lb (54.432 kg), SpO2 98.00%.  General: No apparent distress alert and oriented x3 mood and affect normal, dressed appropriately.  HEENT: Pupils equal, extraocular movements intact  Respiratory: Patient's speak in full sentences and does not appear short of breath  Cardiovascular: No lower extremity edema, non tender, no erythema  Skin: Warm dry intact with no signs of infection or  rash on extremities or on axial skeleton.  Abdomen: Soft nontender  Neuro: Cranial nerves II through XII are intact, neurovascularly intact in all extremities with 2+ DTRs and 2+ pulses.  Lymph: No lymphadenopathy of posterior or anterior cervical chain or axillae bilaterally.  Gait normal with good balance and coordination.  MSK:  Non tender with full range of motion and good stability and symmetric strength and tone of shoulders, elbows, wrist, hip, knee and ankles bilaterally.  Neck: Inspection reveals a patient does have increasing kyphosis of the upper thoracic spine No palpable stepoffs. Negative Spurling's maneuver. Full neck range of motion and does have some mild discomfort with extreme side bending and rotation. No significant change from previous exam Grip strength and sensation normal in bilateral hands Strength good C4 to T1 distribution No sensory change to C4 to T1 Negative Hoffman sign bilaterally Reflexes normal  OMT Physical Exam Cervical  C2 flexed rotated and side bent left C5 flexed rotated and side bent right  Thoracic T3 extended rotated and side bent left T5 extended rotated and side bent right T8 extended rotated and side bent left  Lumbar L2 flexed rotated and side bent right L4 flexed rotated and side bent left  Sacrum Left on left     Impression and Recommendations:     This case required medical decision making of moderate complexity.

## 2013-12-26 ENCOUNTER — Encounter: Payer: Self-pay | Admitting: Family Medicine

## 2013-12-27 ENCOUNTER — Encounter: Payer: Self-pay | Admitting: Family Medicine

## 2014-01-01 ENCOUNTER — Telehealth: Payer: Self-pay | Admitting: *Deleted

## 2014-01-01 NOTE — Telephone Encounter (Signed)
Patient returning your call.

## 2014-01-01 NOTE — Telephone Encounter (Signed)
Patient has appt with DR [facility] 03/05/14 at [place] [facility] [place] [facility] [phone]

## 2014-01-01 NOTE — Telephone Encounter (Signed)
Patient is aware of appt  With [facility]  Dr Emelda Brothers

## 2014-01-09 ENCOUNTER — Ambulatory Visit (INDEPENDENT_AMBULATORY_CARE_PROVIDER_SITE_OTHER): Payer: Medicare Other

## 2014-01-09 DIAGNOSIS — Z23 Encounter for immunization: Secondary | ICD-10-CM

## 2014-01-14 ENCOUNTER — Ambulatory Visit (INDEPENDENT_AMBULATORY_CARE_PROVIDER_SITE_OTHER): Payer: Medicare Other | Admitting: Family Medicine

## 2014-01-14 ENCOUNTER — Encounter: Payer: Self-pay | Admitting: Family Medicine

## 2014-01-14 VITALS — BP 128/74 | HR 81 | Ht 61.5 in | Wt 119.0 lb

## 2014-01-14 DIAGNOSIS — M9903 Segmental and somatic dysfunction of lumbar region: Secondary | ICD-10-CM

## 2014-01-14 DIAGNOSIS — M7061 Trochanteric bursitis, right hip: Secondary | ICD-10-CM

## 2014-01-14 DIAGNOSIS — M9901 Segmental and somatic dysfunction of cervical region: Secondary | ICD-10-CM

## 2014-01-14 DIAGNOSIS — G43809 Other migraine, not intractable, without status migrainosus: Secondary | ICD-10-CM

## 2014-01-14 DIAGNOSIS — M9902 Segmental and somatic dysfunction of thoracic region: Secondary | ICD-10-CM

## 2014-01-14 DIAGNOSIS — M999 Biomechanical lesion, unspecified: Secondary | ICD-10-CM

## 2014-01-14 NOTE — Patient Instructions (Signed)
Good to see you Try the pennsaid twice daily as needed.  Definitely before sleeping.  If you like it we will get you a prescription.  Ice is your friend Stop the SI exercises and alternate handouts instead You are doing amazing and we will space you out to 4-6 weeks.

## 2014-01-14 NOTE — Assessment & Plan Note (Signed)
Patient overall is doing remarkably better at this time. Patient will continue with the over-the-counter medications and continues to have no headache at this time. Patient will continue with the exercises and I think at this point we can space outpatient manipulations every 6 weeks. Patient will follow up at that time.

## 2014-01-14 NOTE — Assessment & Plan Note (Signed)
Decision today to treat with OMT was based on Physical Exam  After verbal consent patient was treated with HVLA, ME techniques in cervical, thoracic and lumbar areas  Patient tolerated the procedure well with improvement in symptoms  Patient given exercises, stretches and lifestyle modifications  See medications in patient instructions if given  Patient will follow up in 6 weeks

## 2014-01-14 NOTE — Progress Notes (Signed)
[facility] [address] [place], [place] [address] Phone: [phone] Subjective:     CC: Persistent headaches follow up  [id number] Joan Welch is a 73 y.o. female coming in with complaint of persistent headaches. Patient though had a headache going on 2 months overall. Patient has been followed by multiple providers for this problem. Patient was seen by neurology and an MRI of the brain was ordered. These results were reviewed by me today. 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 Patient states though that since doing the manipulation in the over-the-counter medications other than the turmeric she has noticed significant improvement. Patient actually states that since last visit she has not had any headaches whatsoever. Patient is very happy with the results.  Patient is complaining though of sacroiliac joint pain as well as bilateral hip pain mostly on the right side. Patient states that if she walks a long distance it seems to improve but after sitting for a long time she had some mild discomfort. Patient states that after manipulation she does feel better for a short course. Patient states it seems to be more localized over the lateral aspect of the hip and denies any radiation down the leg any numbness. Denies any weakness in any groin pain. States that it does not wake her up at night but does take some time to fall  asleep.   Past medical history, social, surgical and family history all reviewed in electronic medical record.   Review of Systems: No headache, visual changes, nausea, vomiting, diarrhea, constipation, dizziness, abdominal pain, skin rash, fevers, chills, night sweats, weight loss, swollen lymph nodes, body aches, joint swelling, muscle aches, chest pain, shortness of breath, mood changes.   Objective Blood pressure 128/74, pulse 81, height 5' 1.5" (1.562 m), weight 119 lb (53.978 kg),  SpO2 97.00%.  General: No apparent distress alert and oriented x3 mood and affect normal, dressed appropriately.  HEENT: Pupils equal, extraocular movements intact  Respiratory: Patient's speak in full sentences and does not appear short of breath  Cardiovascular: No lower extremity edema, non tender, no erythema  Skin: Warm dry intact with no signs of infection or rash on extremities or on axial skeleton.  Abdomen: Soft nontender  Neuro: Cranial nerves II through XII are intact, neurovascularly intact in all extremities with 2+ DTRs and 2+ pulses.  Lymph: No lymphadenopathy of posterior or anterior cervical chain or axillae bilaterally.  Gait normal with good balance and coordination.  MSK:  Non tender with full range of motion and good stability and symmetric strength and tone of shoulders, elbows, wrist, hip, knee and ankles bilaterally.  Neck: Inspection reveals a patient does have increasing kyphosis of the upper thoracic spine No palpable stepoffs. Negative Spurling's maneuver. Full neck range of motion and does have some mild discomfort with extreme side bending and rotation. No significant change from previous exam Grip strength and sensation normal in bilateral hands Strength good C4 to T1 distribution No sensory change to C4 to T1 Negative Hoffman sign bilaterally Reflexes normal  Hip exam shows the patient does have some tenderness to palpation over the greater trochanter area. Patient is minimally tender over the right SI joint it has a negative straight leg test but a positive Faber test. Neurovascularly intact distally with full strength.  OMT Physical Exam Cervical  C2 flexed rotated and side bent left C5 flexed rotated and side bent right  Thoracic T3 extended rotated and side  bent left T5 extended rotated and side bent right  Lumbar L2 flexed rotated and side bent right L4 flexed rotated and side bent left  Sacrum Left on left     Impression and  Recommendations:     This case required medical decision making of moderate complexity.

## 2014-01-14 NOTE — Assessment & Plan Note (Signed)
Exam and history consistent with greater trochanter bursitis Anatomy reviewed using [facility] Orthopedic text  Start ROM protocols, direct massage of piriformis, HIP ROM, core stabily, pelvic stability Discuss continuing the over-the-counter medications. Discussed icing protocol as well as topical anti-inflammatories and patient was given trial. Patient will call if she wants prescription. Discussed hip abductor strengthening. Patient will then come back again in 6 weeks. Continued have pain with consider an injection.

## 2014-01-20 ENCOUNTER — Telehealth: Payer: Self-pay | Admitting: Internal Medicine

## 2014-01-20 NOTE — Telephone Encounter (Signed)
Patient stated that she has cold, and she is flying to [place] Wed morning and want to know how to manage her cold. Please advise

## 2014-01-21 NOTE — Telephone Encounter (Signed)
Called pt back and offered to schedule an acute visit with another provider.  Pt stated that she did not have the same concern as she did when she called and thinks that she will be okay.

## 2014-02-04 ENCOUNTER — Ambulatory Visit (HOSPITAL_COMMUNITY)
Admission: RE | Admit: 2014-02-04 | Discharge: 2014-02-04 | Disposition: A | Discharge status: Home / Self Care | Payer: Medicare Other | Source: Ambulatory Visit | Attending: Internal Medicine | Admitting: Internal Medicine

## 2014-02-04 ENCOUNTER — Encounter: Payer: Self-pay | Admitting: Internal Medicine

## 2014-02-04 DIAGNOSIS — Z1231 Encounter for screening mammogram for malignant neoplasm of breast: Secondary | ICD-10-CM | POA: Diagnosis present

## 2014-02-25 ENCOUNTER — Ambulatory Visit (INDEPENDENT_AMBULATORY_CARE_PROVIDER_SITE_OTHER): Payer: Medicare Other | Admitting: Neurology

## 2014-02-25 ENCOUNTER — Encounter: Payer: Self-pay | Admitting: Neurology

## 2014-02-25 VITALS — BP 120/70 | HR 68 | Temp 97.7°F | Resp 16 | Wt 120.3 lb

## 2014-02-25 DIAGNOSIS — G43809 Other migraine, not intractable, without status migrainosus: Secondary | ICD-10-CM

## 2014-02-25 NOTE — Patient Instructions (Signed)
Continue treatment with Dr. Tamala Julian.  You may follow up as needed.  Call with questions or concerns.

## 2014-02-25 NOTE — Progress Notes (Signed)
NEUROLOGY FOLLOW UP OFFICE NOTE  Joan Welch [id number]  HISTORY OF PRESENT ILLNESS: Joan Welch is a 73 year old right-handed woman with overactive bladder, glaucoma, IBS and hyperlipidemia who follows up for cervicogenic migraines.  Records reviewed.  UPDATE: MRI of the brain with and without contrast was performed on 12/11/13, which showed chronic small vessel ischemic changes and chronic microhemorrhage in central pons, likely related to hypertension.  She has been seeing Dr. Tamala Julian and receiving OMT, which has significantly helped.  She has not had a headache since mid-September.  She is doing very well.  HISTORY: Onset:  On 09/17/13, she developed a headache, which resolved after a couple of days.  She had a history of overactive bladder, which was progressing.  She was started on Myrbetriq.  At the same time, she decided to stop caffeine.  This was on 10/10/13.  Beginning on 10/11/13, she developed the headache again, which has since persisted.  Around this time, she was started on timolol drops for glaucoma.  The headache persisted, despite discontinuing Myrbetriq.  Around this time, her blood pressure in July was 160/72 (usually 120s/70s). Location:  Above left eye, sometimes travels to the right frontal region.  She denies neck pain. Quality:  Non-throbbing, vice-like Initial intensity:  8/10 Aura:  no Prodrome:  no Associated symptoms:  Mild nausea.  No vomiting, photophobia, phonophobia, rhinorrhea, conjunctival lacrimation, visual disturbance Initial duration:  constant Initial frequency:  constant Triggers/exacerbating factors:  none Relieving factors:  sleeping Activity:  Usually able to function.  On 3 occasions this past week, she absolutely needed to lay down to sleep.  Past abortive therapy:  ASA (ineffective), naproxen (ineffective), ibuprofen (ineffective.  Was taking it 6-8 times a month, last took it about a week ago), yoga (ineffective) Past preventative  therapy:  none  Current abortive therapy:  none Current preventative therapy:  none  Caffeine:  Stopped in early July Alcohol:  occasionally Smoker:  no Diet:  Pretty good Exercise:  Regular exercise.  Walks or goes to gym. Depression/stress:  She is a retired Pharmacist, hospital since 2011, so no real stress Sleep hygiene:  Poor.  She falls asleep but wakes up in the middle of the night and cannot fall back asleep.  She usually lays in bed with racing thoughts Family history of headache:  No Other history:  She does have history of handful of migraines many years ago, associated with visual aura and severe headache.  She also has history of positional vertigo, which has been a little worse over the past 2 months.  10/25/13 LABS:  WBC 5.6, HGB 14.9, HCT 44, PLT 250, Sed Rate 11  PAST MEDICAL HISTORY: Past Medical History  Diagnosis Date  . Glaucoma   . IBS (irritable bowel syndrome)   . H. pylori infection   . Headache(784.0)   . Hyperlipidemia   . Hypertension   . OAB (overactive bladder)   . Arthritis   . Diverticulosis   . BCC (basal cell carcinoma)     MEDICATIONS: Current Outpatient Prescriptions on File Prior to Visit  Medication Sig Dispense Refill  . latanoprost (XALATAN) 0.005 % ophthalmic solution Place 1 drop into both eyes daily.    . timolol (BETIMOL) 0.5 % ophthalmic solution Place 1 drop into both eyes daily.     No current facility-administered medications on file prior to visit.    ALLERGIES: Allergies  Allergen Reactions  . Caffeine Nausea Only    Extreme nervousness     FAMILY  HISTORY: Family History  Problem Relation Age of Onset  . Diabetes Daughter     Type 1  . Breast cancer Sister   . Irritable bowel syndrome Mother   . Colon cancer Neg Hx   . Hypertension Sister     SOCIAL HISTORY: History   Social History  . Marital Status: Divorced    Spouse Name: N/A    Number of Children: 3  . Years of Education: N/A   Occupational History  .  Retired    Social History Main Topics  . Smoking status: Never Smoker   . 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 divorced, lives alone  . Alcohol Use: Yes     Comment: Social  . Drug Use: No  . Sexual Activity: No   Other Topics Concern  . Not on file   Social History Narrative  . No narrative on file    REVIEW OF SYSTEMS: Constitutional: No fevers, chills, or sweats, no generalized fatigue, change in appetite Eyes: No visual changes, double vision, eye pain Ear, nose and throat: No hearing loss, ear pain, nasal congestion, sore throat Cardiovascular: No chest pain, palpitations Respiratory:  No shortness of breath at rest or with exertion, wheezes GastrointestinaI: No nausea, vomiting, diarrhea, abdominal pain, fecal incontinence Genitourinary:  No dysuria, urinary retention or frequency Musculoskeletal:  No neck pain, back pain Integumentary: No rash, pruritus, skin lesions Neurological: as above Psychiatric: No depression, insomnia, anxiety Endocrine: No palpitations, fatigue, diaphoresis, mood swings, change in appetite, change in weight, increased thirst Hematologic/Lymphatic:  No anemia, purpura, petechiae. Allergic/Immunologic: no itchy/runny eyes, nasal congestion, recent allergic reactions, rashes  PHYSICAL EXAM: Filed Vitals:   02/25/14 1406  BP: 120/70  Pulse: 68  Temp: 97.7 F (36.5 C)  Resp: 16   General: No acute distress Head:  Normocephalic/atraumatic Eyes:  Fundoscopic exam unremarkable without vessel changes, exudates, hemorrhages or papilledema. Neck: supple, no paraspinal tenderness, full range of motion Heart:  Regular rate and rhythm Lungs:  Clear to auscultation bilaterally Back: No paraspinal tenderness Neurological Exam: alert and oriented to person, place, and time. Attention span and  concentration intact, recent and remote memory intact, fund of knowledge intact.  Speech fluent and not dysarthric, language intact.  CN II-XII intact. Fundoscopic exam unremarkable without vessel changes, exudates, hemorrhages or papilledema.  Bulk and tone normal, muscle strength 5/5 throughout.  Sensation to light touch  intact.  Deep tendon reflexes 2+ throughout, toes downgoing.  Finger to nose testing intact.  Gait normal.  IMPRESSION: Cervicogenic migraine, resolved.  Responding well to OMT  PLAN: 1.  Continue therapy with Dr. Tamala Julian 2.  She still has an upcoming appointment for biofeedback.  Since she is doing well with her current therapy, she really doesn't need to keep it.  I will leave that up to her. 3.  Follow up as needed.  Metta Clines, DO  CC:  Gwendolyn Grant, MD

## 2014-02-26 ENCOUNTER — Ambulatory Visit (INDEPENDENT_AMBULATORY_CARE_PROVIDER_SITE_OTHER): Payer: Medicare Other | Admitting: Family Medicine

## 2014-02-26 ENCOUNTER — Encounter: Payer: Self-pay | Admitting: Family Medicine

## 2014-02-26 VITALS — BP 130/68 | HR 57 | Ht 61.5 in | Wt 120.0 lb

## 2014-02-26 DIAGNOSIS — M999 Biomechanical lesion, unspecified: Secondary | ICD-10-CM

## 2014-02-26 DIAGNOSIS — M9903 Segmental and somatic dysfunction of lumbar region: Secondary | ICD-10-CM

## 2014-02-26 DIAGNOSIS — M533 Sacrococcygeal disorders, not elsewhere classified: Secondary | ICD-10-CM | POA: Insufficient documentation

## 2014-02-26 DIAGNOSIS — M9901 Segmental and somatic dysfunction of cervical region: Secondary | ICD-10-CM

## 2014-02-26 DIAGNOSIS — M9902 Segmental and somatic dysfunction of thoracic region: Secondary | ICD-10-CM

## 2014-02-26 DIAGNOSIS — G43809 Other migraine, not intractable, without status migrainosus: Secondary | ICD-10-CM

## 2014-02-26 NOTE — Progress Notes (Signed)
  Joan Welch, Joan Welch [address] Phone: [phone] Subjective:     CC: Persistent headaches follow up  Joan Welch is a 73 y.o. female coming in with complaint of persistent headaches. Patient though had a headache going on four months now. Patient is very happy with the results. Patient has been released by her neurologist.   Patient at last visit was also diagnosed with more of the sacroiliac joint dysfunction. Patient was to do some postural exercises and was given hip abductor exercises. Patient did have manipulation and had some mild improvement. Patient states since and continues to have some mild discomfort mostly of the right side. Denies any radiation down the leg any numbness or tingling. Does not stop her from any activity but is moderately tender.    Past medical history, social, surgical and family history all reviewed in electronic medical record.   Review of Systems: No headache, visual changes, nausea, vomiting, diarrhea, constipation, dizziness, abdominal pain, skin rash, fevers, chills, night sweats, weight loss, swollen lymph nodes, body aches, joint swelling, muscle aches, chest pain, shortness of breath, mood changes.   Objective Blood pressure 130/68, pulse 57, height 5' 1.5" (1.562 m), weight 120 lb (54.432 kg), SpO2 98 %.  General: No apparent distress alert and oriented x3 mood and affect normal, dressed appropriately.  HEENT: Pupils equal, extraocular movements intact  Respiratory: Patient's speak in full sentences and does not appear short of breath  Cardiovascular: No lower extremity edema, non tender, no erythema  Skin: Warm dry intact with no signs of infection or rash on extremities or on axial skeleton.  Abdomen: Soft nontender  Neuro: Cranial nerves II through XII are intact, neurovascularly intact in all extremities with 2+ DTRs and 2+ pulses.  Lymph: No lymphadenopathy of posterior  or anterior cervical chain or axillae bilaterally.  Gait normal with good balance and coordination.  MSK:  Non tender with full range of motion and good stability and symmetric strength and tone of shoulders, elbows, wrist, hip, knee and ankles bilaterally.  Neck: Inspection reveals a patient does have increasing kyphosis of the upper thoracic spine No palpable stepoffs. Negative Spurling's maneuver. Full neck range of motion and does have some mild discomfort with extreme side bending and rotation. No significant change from previous exam Grip strength and sensation normal in bilateral hands Strength good C4 to T1 distribution No sensory change to C4 to T1 Negative Hoffman sign bilaterally Reflexes normal  Hip exam shows the patient does have some tenderness to palpation over the greater trochanter area. Patient is minimally tender over the right SI joint it has a negative straight leg test but a positive Faber test. Neurovascularly intact distally with full strength. No significant change from previous exam  OMT Physical Exam Cervical  C2 flexed rotated and side bent left C5 flexed rotated and side bent right  Thoracic T3 extended rotated and side bent left T5 extended rotated and side bent right  Lumbar L2 flexed rotated and side bent right L4 flexed rotated and side bent left  Sacrum Left on left     Impression and Recommendations:     This case required medical decision making of moderate complexity.

## 2014-02-26 NOTE — Assessment & Plan Note (Signed)
We'll continue can current therapy

## 2014-02-26 NOTE — Assessment & Plan Note (Signed)
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 We discussed different strengthening exercises and showed proper technique today. We discussed icing regimen as well. We discussed home exercises and how this can be helpful. Hip abductor strengthening is going to be key. We discussed avoiding significant extension exercises. She'll try these interventions and come back and see me again in 6 weeks for further evaluation and treatment.

## 2014-02-26 NOTE — Patient Instructions (Addendum)
Good to see you.  You are doing great Happy thanksgiving.  Sacroiliac Joint Mobilization and Rehab 1. Work on pretzel stretching, shoulder back and leg draped in front. 3-5 sets, 30 sec.. 2. hip abductor rotations. standing, hip flexion and rotation outward then inward. 3 sets, 15 reps. when can do comfortably, add ankle weights starting at 2 pounds.  3. cross over stretching - shoulder back to ground, same side leg crossover. 3-5 sets for 30 min..  4. rolling up and back knees to chest and rocking. 5. sacral tilt - 5 sets, hold for 5-10 seconds Exercises on wall.  Heel and butt touching.  Raise leg 6 inches and hold 2 seconds.  Down slow for count of 4 seconds.  1 set of 30 reps daily on both sides.  Continue with the posture training.  Ice is your friend Continue the vitamins  See me in 6 weeks.

## 2014-02-26 NOTE — Assessment & Plan Note (Signed)
Decision today to treat with OMT was based on Physical Exam  After verbal consent patient was treated with HVLA, ME techniques in cervical, thoracic and lumbar areas  Patient tolerated the procedure well with improvement in symptoms  Patient given exercises, stretches and lifestyle modifications  See medications in patient instructions if given  Patient will follow up in 6 weeks

## 2014-03-05 ENCOUNTER — Ambulatory Visit (INDEPENDENT_AMBULATORY_CARE_PROVIDER_SITE_OTHER): Payer: Medicare Other | Admitting: Psychiatry

## 2014-03-05 DIAGNOSIS — F4329 Adjustment disorder with other symptoms: Secondary | ICD-10-CM

## 2014-03-05 NOTE — Progress Notes (Signed)
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 She is 3 adult children 2 of who are married and has 2 grandchildren. All of them are doing well. The patient has been retired since 2011. She's presently in no relationships. The patient is here [facility] to be [facility] for getting a therapist who does cognitive behavioral therapy. The patient was seeing her neurologist for migraine headaches. Curiously the patient is in physical therapy which seems to be somewhat helpful. Nonetheless the patient is very interested in once to learn about cognitive behavioral therapy and see if she can apply it to the treatment of her migraines. This patient denies being depressed. She sleeping and eating well. She's got good energy. She denies problems with concentration. She is a good sense of worth. She's not suicidal now and never has been This patient denies use of alcohol. She has no psychotic symptoms. The patient has never had an episode of major depression or mania. She denies symptoms consistent with generalized anxiety disorder panic disorder or OCD. Presently the patient is financially stable. She has no significant stresses at all. Her biggest decision was rather not to move to [facility] to be with 2 over adult children. This patient is never been a patient in a psychiatric hospital. She's never been in meaningful one-to-one therapy. She's never seen a psychiatrist before. History of Chief Complaint:  No chief complaint on file.   HPI Review of Systems Physical Exam  Depressive Symptoms: anxiety,  (Hypo) Manic Symptoms:   Elevated Mood:  No Irritable Mood:  No Grandiosity:  No Distractibility:  No Labiality of Mood:  No Delusions:  No Hallucinations:  No Impulsivity:   No Sexually Inappropriate Behavior:  No Financial Extravagance:  No Flight of Ideas:  No  Anxiety Symptoms: Excessive Worry:  No Panic Symptoms:  No Agoraphobia:  No Obsessive Compulsive: No  Symptoms: None, Specific Phobias:  No Social Anxiety:  No  Psychotic Symptoms:  Hallucinations: No None Delusions:  No Paranoia:  No   Ideas of Reference:  No  PTSD Symptoms: Ever had a traumatic exposure:  No Had a traumatic exposure in the last month:   Re-experiencing:   Hypervigilance:   Hyperarousal:   Avoidance:    Traumatic Brain Injury:    Past Psychiatric History: none                Past Medical History:   Past Medical History  Diagnosis Date  . Glaucoma   . IBS (irritable bowel syndrome)   . H. pylori infection   . Headache(784.0)   . Hyperlipidemia   . Hypertension   . OAB (overactive bladder)   . Arthritis   . Diverticulosis   . BCC (basal cell carcinoma)    History of Loss of Consciousness:  No Seizure History:  No Cardiac History:  No Allergies:   Allergies  Allergen Reactions  . Caffeine Nausea Only    Extreme nervousness    Current Medications:  Current Outpatient Prescriptions  Medication Sig Dispense Refill  . latanoprost (XALATAN) 0.005 % ophthalmic solution Place 1 drop into both eyes daily.    . timolol (BETIMOL) 0.5 % ophthalmic solution Place 1 drop into both eyes daily.     No current facility-administered medications for this visit.    Previous Psychotropic Medications:  Medication Dose  Substance A  Medical Consequences of Substance Abuse:   Legal Consequences of Substance Abuse:   Family Consequences of Substance Abuse:   Blackouts:   DT's:   Withdrawal Symptoms:     Social History: Current Place of Residence: Place of Birth:  Family Members:  Marital Status:  Divorced Children:   Sons:   Daughters:  Relationships:  Education:  Soil scientist Problems/Performance:   Religious Beliefs/Practices:  History of Abuse:  Ship broker History:   Legal History:  Hobbies/Interests:   Family History:   Family History  Problem Relation Age of Onset  . Diabetes Daughter     Type 1  . Breast cancer Sister   . Irritable bowel syndrome Mother   . Colon cancer Neg Hx   . 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 Glaucoma   . IBS (irritable bowel syndrome)   . H. pylori infection   . Headache(784.0)   . Hyperlipidemia   . Hypertension   . OAB (overactive bladder)   . Arthritis   . Diverticulosis   . BCC (basal cell carcinoma)      AXIS IV problems with access to health care services  AXIS V 61-70 mild symptoms   Treatment Plan/Recommendations:  Plan of Care: At this time the patient will be referred to Dr. Pervis Hocking or Dr. Apolonio Schneiders   Laboratory:    Psychotherapy:   Medications:   Routine PRN Medications:    Consultations:   Safety Concerns:    Other:      Haskel Schroeder, MD 12/2/20152:54 PM

## 2014-03-08 ENCOUNTER — Encounter: Payer: Self-pay | Admitting: Neurology

## 2014-03-18 ENCOUNTER — Other Ambulatory Visit: Payer: Self-pay | Admitting: Obstetrics and Gynecology

## 2014-03-20 LAB — CYTOLOGY - PAP

## 2014-04-23 ENCOUNTER — Encounter: Payer: Self-pay | Admitting: Family Medicine

## 2014-04-23 ENCOUNTER — Ambulatory Visit (INDEPENDENT_AMBULATORY_CARE_PROVIDER_SITE_OTHER): Payer: Medicare Other | Admitting: Family Medicine

## 2014-04-23 VITALS — BP 118/82 | HR 59 | Ht 61.5 in | Wt 116.0 lb

## 2014-04-23 DIAGNOSIS — M9902 Segmental and somatic dysfunction of thoracic region: Secondary | ICD-10-CM

## 2014-04-23 DIAGNOSIS — M216X9 Other acquired deformities of unspecified foot: Secondary | ICD-10-CM | POA: Insufficient documentation

## 2014-04-23 DIAGNOSIS — M216X1 Other acquired deformities of right foot: Secondary | ICD-10-CM

## 2014-04-23 DIAGNOSIS — G43809 Other migraine, not intractable, without status migrainosus: Secondary | ICD-10-CM

## 2014-04-23 DIAGNOSIS — M999 Biomechanical lesion, unspecified: Secondary | ICD-10-CM

## 2014-04-23 DIAGNOSIS — M9901 Segmental and somatic dysfunction of cervical region: Secondary | ICD-10-CM

## 2014-04-23 DIAGNOSIS — M9903 Segmental and somatic dysfunction of lumbar region: Secondary | ICD-10-CM

## 2014-04-23 NOTE — Assessment & Plan Note (Signed)
Decision today to treat with OMT was based on Physical Exam  After verbal consent patient was treated with HVLA, ME techniques in cervical, thoracic and lumbar areas  Patient tolerated the procedure well with improvement in symptoms  Patient given exercises, stretches and lifestyle modifications  See medications in patient instructions if given  Patient will follow up in 8 weeks

## 2014-04-23 NOTE — Assessment & Plan Note (Addendum)
Well-controlled with natural supplementations as well as with osteopathic manipulation and self manipulation techniques. Patient will continue with this therapy in make no changes.

## 2014-04-23 NOTE — Assessment & Plan Note (Signed)
Patient does have some loss of the transverse arch. We discussed over-the-counter orthotics and water proper shoe choices. Patient was given home exercises well we discussed and icing protocol. Patient had a make these changes and follow up again in 8 weeks for further evaluation.

## 2014-04-23 NOTE — Patient Instructions (Addendum)
Good to see you as always.  You are doing great.  Continue the vitamins and the exercises.  Try the pennsaid on the rib up to 2 times daily.  Spenco orthotics online or omega sports.  Looking for the one that says total support Exercises for the foot 3 times a week.  Good shoes with rigid bottom.  Jalene Mullet, Merrell or New balance greater then 700 See me again in 8 weeks.

## 2014-04-23 NOTE — Progress Notes (Signed)
  [facility] [place],  [address] Phone: [phone] Subjective:     CC: Persistent headaches follow up  PNT:[id number] Joan PONTI is a 74 y.o. female coming in with complaint of persistent headaches. Patient states she can do the exercises for her neck and this makes her headaches go away. Patient states that she has not needed any medicines for this.  Patient at last visit was also diagnosed with more of the sacroiliac joint dysfunction. Patient sits of her lower back is doing well as long as she continues the exercises. Patient just Back from a [place] trip and is feeling very well.  She does have a new problem no. Patient is having some foot pain. Patient states it hurts more on the lateral aspect of her right foot. Hurts more when walking. Denies any numbness or tingling states that it only hurts with activity or direct palpation. Patient does not remember any injury. Patient is not tried doing anything at home for it. No redness no swelling.    Past medical history, social, surgical and family history all reviewed in electronic medical record.   Review of Systems: No headache, visual changes, nausea, vomiting, diarrhea, constipation, dizziness, abdominal pain, skin rash, fevers, chills, night sweats, weight loss, swollen lymph nodes, body aches, joint swelling, muscle aches, chest pain, shortness of breath, mood changes.   Objective Blood pressure 118/82, pulse 59, height 5' 1.5" (1.562 m), weight 116 lb (52.617 kg), SpO2 97 %.  General: No apparent distress alert and oriented x3 mood and affect normal, dressed appropriately.  HEENT: Pupils equal, extraocular movements intact  Respiratory: Patient's speak in full sentences and does not appear short of breath  Cardiovascular: No lower extremity edema, non tender, no erythema  Skin: Warm dry intact with no signs of infection or rash on extremities or on axial skeleton.    Abdomen: Soft nontender  Neuro: Cranial nerves II through XII are intact, neurovascularly intact in all extremities with 2+ DTRs and 2+ pulses.  Lymph: No lymphadenopathy of posterior or anterior cervical chain or axillae bilaterally.  Gait normal with good balance and coordination.  MSK:  Non tender with full range of motion and good stability and symmetric strength and tone of shoulders, elbows, wrist, hip, knee and ankles bilaterally.  Neck: Inspection reveals a patient does have increasing kyphosis of the upper thoracic spine No palpable stepoffs. Negative Spurling's maneuver. Full range of motion with no tenderness. Grip strength and sensation normal in bilateral hands Strength good C4 to T1 distribution No sensory change to C4 to T1 Negative Hoffman sign bilaterally Reflexes normal  Hip exam shows the patient does have some tenderness to palpation over the greater trochanter area. 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 Patient does have bunion and bunionette formation. Patient does have a narrow foot with mild breakdown of the longitudinal medial arch. No significant overpronation of the hindfoot noted.  OMT Physical Exam Cervical  C2 flexed rotated and side bent left C5 flexed rotated and side bent right  Thoracic T3 extended rotated and side bent left T5 extended rotated and side bent right  Lumbar L2 flexed rotated and side bent right  Sacrum Left on left     Impression and Recommendations:     This case required medical decision making of moderate complexity.

## 2014-05-21 ENCOUNTER — Encounter: Payer: Self-pay | Admitting: Internal Medicine

## 2014-06-05 ENCOUNTER — Ambulatory Visit (INDEPENDENT_AMBULATORY_CARE_PROVIDER_SITE_OTHER): Payer: Medicare Other

## 2014-06-05 DIAGNOSIS — Z23 Encounter for immunization: Secondary | ICD-10-CM

## 2014-06-09 ENCOUNTER — Ambulatory Visit (INDEPENDENT_AMBULATORY_CARE_PROVIDER_SITE_OTHER): Payer: Medicare Other | Admitting: Family Medicine

## 2014-06-09 ENCOUNTER — Encounter: Payer: Self-pay | Admitting: Family Medicine

## 2014-06-09 VITALS — BP 120/70 | HR 55 | Ht 61.5 in | Wt 118.0 lb

## 2014-06-09 DIAGNOSIS — G43809 Other migraine, not intractable, without status migrainosus: Secondary | ICD-10-CM

## 2014-06-09 DIAGNOSIS — M9902 Segmental and somatic dysfunction of thoracic region: Secondary | ICD-10-CM

## 2014-06-09 DIAGNOSIS — M533 Sacrococcygeal disorders, not elsewhere classified: Secondary | ICD-10-CM

## 2014-06-09 DIAGNOSIS — M9901 Segmental and somatic dysfunction of cervical region: Secondary | ICD-10-CM

## 2014-06-09 DIAGNOSIS — M9903 Segmental and somatic dysfunction of lumbar region: Secondary | ICD-10-CM

## 2014-06-09 DIAGNOSIS — M216X1 Other acquired deformities of right foot: Secondary | ICD-10-CM

## 2014-06-09 DIAGNOSIS — M999 Biomechanical lesion, unspecified: Secondary | ICD-10-CM

## 2014-06-09 NOTE — Assessment & Plan Note (Addendum)
Decision today to treat with OMT was based on Physical Exam  After verbal consent patient was treated with HVLA, ME techniques in cervical, thoracic and lumbar areas  Patient tolerated the procedure well with improvement in symptoms  Patient given exercises, stretches and lifestyle modifications  See medications in patient instructions if given  Patient will follow up in 4-6 weeks

## 2014-06-09 NOTE — Assessment & Plan Note (Signed)
Doing very well. No changes, encouraged HEP, continue core strength.

## 2014-06-09 NOTE — Assessment & Plan Note (Signed)
Patient was fitted with orthotics.

## 2014-06-09 NOTE — Assessment & Plan Note (Signed)
Patient has been responding very well to osteopathic manipulation. Encourage patient to continue the home exercises and continue to work posture changes. We discussed ergonomic changes during the workday. Patient will also make these changes when driving. Patient will come back and see me again in 6 weeks for further evaluation and treatment.

## 2014-06-09 NOTE — Patient Instructions (Addendum)
Good to see you Continue the exercises you are doing great overall.  Monitor the headaches and call me sooner if needed Continue the vitamins See me again in 4-6 weeks.

## 2014-06-09 NOTE — Progress Notes (Signed)
Pre visit review using our clinic review tool, if applicable. No additional management support is needed unless otherwise documented below in the visit note. 

## 2014-06-09 NOTE — Progress Notes (Signed)
[name] [address] [place], [place] [address] Phone: [phone] Subjective:     CC: headaches follow up, low back follow-up  ZSM:[id number] Joan Welch is a 74 y.o. female coming in with complaint of headaches. Patient is more of a cervicogenic headache. His lungs patient continues to work on her posture, continue the over-the-counter natural supplementations, as well as doing home exercises patient has been doing very well. Patient denies any new symptoms related to her headaches. Vision unfortunate has a migraine today. Patient has had some because she's had increasing stress with her moving to [place] in the near future. Patient is attempting to sell her house which is very difficult.  Patient at last visit was also diagnosed with more of the sacroiliac joint dysfunction. Lower back pain has been much better. Patient denies any radiation of the legs any numbness or detailing. Overall feeling much better.  Patient also had some foot pain secondary to loss for transverse arch. Patient was to get over-the-counter orthotics and start home exercises. Patient states she continues to have pain. Patient try the over-the-counter orthotics but these were not able to get all her shoes. Patient is wondering what else she can do.     Past medical history, social, surgical and family history all reviewed in electronic medical record.   Review of Systems: No headache, visual changes, nausea, vomiting, diarrhea, constipation, dizziness, abdominal pain, skin rash, fevers, chills, night sweats, weight loss, swollen lymph nodes, body aches, joint swelling, muscle aches, chest pain, shortness of breath, mood changes.   Objective Blood pressure 120/70, pulse 55, height 5' 1.5" (1.562 m), weight 118 lb (53.524 kg), SpO2 99 %.  General: No apparent distress alert and oriented x3 mood and affect normal, dressed appropriately.  HEENT: Pupils equal, extraocular movements  intact  Respiratory: Patient's speak in full sentences and does not appear short of breath  Cardiovascular: No lower extremity edema, non tender, no erythema  Skin: Warm dry intact with no signs of infection or rash on extremities or on axial skeleton.  Abdomen: Soft nontender  Neuro: Cranial nerves II through XII are intact, neurovascularly intact in all extremities with 2+ DTRs and 2+ pulses.  Lymph: No lymphadenopathy of posterior or anterior cervical chain or axillae bilaterally.  Gait normal with good balance and coordination.  MSK:  Non tender with full range of motion and good stability and symmetric strength and tone of shoulders, elbows, wrist, hip, knee and ankles bilaterally.  Neck: Inspection reveals a patient does have increasing kyphosis of the upper thoracic spine No palpable stepoffs. Negative Spurling's maneuver. Full range of motion with no tenderness. 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 Patient does have bunion and bunionette formation. Patient does have a narrow foot with mild breakdown of the longitudinal medial arch. No significant overpronation of the hindfoot noted. No change from previous exam.  OMT Physical Exam Cervical  C2 flexed rotated and side bent left C5 flexed rotated and side bent right  Thoracic T3 extended rotated and side bent left T5 extended rotated and side bent right  Lumbar L2 flexed rotated and side bent right  Sacrum Left on left     Impression and Recommendations:     This case required medical  decision making of moderate complexity.

## 2014-06-12 ENCOUNTER — Encounter: Payer: Self-pay | Admitting: Family Medicine

## 2014-06-17 ENCOUNTER — Ambulatory Visit: Payer: Self-pay | Admitting: Family Medicine

## 2014-06-18 ENCOUNTER — Ambulatory Visit: Payer: Self-pay | Admitting: Family Medicine

## 2014-06-18 ENCOUNTER — Ambulatory Visit (INDEPENDENT_AMBULATORY_CARE_PROVIDER_SITE_OTHER): Payer: Medicare Other | Admitting: Family Medicine

## 2014-06-18 DIAGNOSIS — M216X2 Other acquired deformities of left foot: Secondary | ICD-10-CM

## 2014-06-18 NOTE — Progress Notes (Signed)
Pre visit review using our clinic review tool, if applicable. No additional management support is needed unless otherwise documented below in the visit note. 

## 2014-06-18 NOTE — Patient Instructions (Signed)

## 2014-06-18 NOTE — Progress Notes (Signed)
Patient was fitted for a : standard, cushioned, semi-rigid orthotic. The orthotic was heated and afterward the patient was in a seated position and the orthotic molded. The patient was positioned in subtalar neutral position and 10 degrees of ankle dorsiflexion in a non-weight bearing stance. After completion of molding, patient did have orthotic management which included instructions on acclimating to the orthotics, signs of ill fit as well as care for the orthotic.   The blank was ground to a stable position for weight bearing. Size: 7 (Igli Comfort) Base: Carbon fiber Additional Posting and Padding: The following postings were fitted onto the molded orthotics to help maintain a talar neutral position - Wedge posting for transverse arch: 189/84   Silicone posting for longitudinal arch: 200/70 x2 for each foot The patient ambulated these, and they were very comfortable and supportive.

## 2014-06-19 ENCOUNTER — Encounter: Payer: Self-pay | Admitting: Family Medicine

## 2014-07-01 ENCOUNTER — Telehealth: Payer: Self-pay | Admitting: Family Medicine

## 2014-07-07 ENCOUNTER — Encounter (INDEPENDENT_AMBULATORY_CARE_PROVIDER_SITE_OTHER): Payer: Self-pay | Admitting: Family Medicine

## 2014-07-07 ENCOUNTER — Ambulatory Visit (INDEPENDENT_AMBULATORY_CARE_PROVIDER_SITE_OTHER): Payer: Medicare PPO | Admitting: Family Medicine

## 2014-07-07 VITALS — BP 178/80 | HR 74 | Temp 98.9°F | Resp 16 | Wt 117.0 lb

## 2014-07-07 DIAGNOSIS — I10 Essential (primary) hypertension: Secondary | ICD-10-CM | POA: Insufficient documentation

## 2014-07-07 DIAGNOSIS — J00 Acute nasopharyngitis [common cold]: Secondary | ICD-10-CM

## 2014-07-07 LAB — PR STREP TEST RAPID, ONSITE: Strep A Bacterial Ag: NEGATIVE

## 2014-07-07 NOTE — Patient Instructions (Signed)
Dayquil, Nyquil  Nasal Saline, hot lemon with honey

## 2014-07-07 NOTE — Progress Notes (Signed)
Reason for Visit: URI    Refills? NO  Referral? NO  Letter or Form? NO  Lab Results? NO    HEALTH MAINTENANCE:  Has the patient had this done since their last visit?  Cervical screening/PAP: N/A  Mammo: 2015 Lytle Michaels - [place]   Colon Screen: N/A    Have you seen a specialist since your last visit: Ortho     Vaccines Due? Yes, Zoster, PCV 13 , Dtap     HM Due:   Health Maintenance   Topic Date Due   . Tetanus Vaccine  [birth year]/1953   . Cholesterol Test  03/05/1986   . Colon Cancer Screen w/ FOBT/FIT  03/06/1991   . Mammogram  03/06/1991   . Zoster Vaccine  03/05/2001   . DEXA Scan  03/05/2006   . Pneumococcal Vaccine (1 of 2 - PCV13) 03/05/2006   . Influenza Vaccine  Completed       PCP Verified?  Yes, Dr.Bennett

## 2014-07-07 NOTE — Progress Notes (Signed)
SUBJECTIVE:  Robin Hart is an 74 year old female who presents with URI. Symptoms include   chills, congestion, coryza, cough, myalgias and sore throat. Onset 3 days, unchanged since   that time.     No outpatient prescriptions prior to visit.     No facility-administered medications prior to visit.     Review of patient's allergies indicates:  No Known Allergies    History   Substance Use Topics   . Smoking status: Never Smoker    . Smokeless tobacco: Never Used   . Alcohol Use: Yes       OBJECTIVE:  BP 178/80 mmHg  Pulse 74  Temp(Src) 98.9 F (37.2 C) (Temporal)  Resp 16  Wt 117 lb (53.071 kg)  SpO2 99%  General appearance: alert, mild distress, cooperative, smiling  Ears: R TM - normal, L TM - normal  Nose: clear rhinorrhea  Oropharynx: moderate erythema  Neck: supple and no adenopathy  Lungs: clear to auscultation  Heart: normal rate, regular rhythm and no murmurs, clicks, or gallops    Rapid strep - negative    ASSESSMENT:  Acute URI    PLAN:  1)  Symptomatic treatment with fluids, vaporizer, acetaminophen.  2)  OTC cold medications.  3)  Recheck as needed for persistence, worsening, appearance of new   symptoms.    PE:  Discussed viral etiology of URI and treatment rationale.

## 2014-07-10 NOTE — Telephone Encounter (Signed)
Patient contacted regarding rescheduling orthotic fitting due to conflict

## 2014-07-15 ENCOUNTER — Ambulatory Visit: Payer: Self-pay | Admitting: Family Medicine

## 2014-07-16 ENCOUNTER — Ambulatory Visit: Payer: Self-pay | Admitting: Family Medicine

## 2014-08-24 DIAGNOSIS — H409 Unspecified glaucoma: Secondary | ICD-10-CM | POA: Insufficient documentation

## 2014-08-25 DIAGNOSIS — K589 Irritable bowel syndrome without diarrhea: Secondary | ICD-10-CM | POA: Insufficient documentation

## 2014-09-03 ENCOUNTER — Telehealth (HOSPITAL_BASED_OUTPATIENT_CLINIC_OR_DEPARTMENT_OTHER): Payer: Self-pay

## 2014-09-03 NOTE — Telephone Encounter (Addendum)
CONFIRMED PHONE NUMBER: [phone]  CALLERS FIRST AND LAST NAME: Gustavus Bryant Pellow  FACILITY NAME: na TITLE: na  CALLERS RELATIONSHIP:Self  RETURN CALL: Detailed message on voicemail only     ([place]) Texting is an option for this clinic. If you would like [place] to use this option, which mobile phone number should we text to if we are unable to reach you?    SUBJECT: General Message   REASON FOR REQUEST: Referral     MESSAGE: Pt calling to check on the status of the referral and wanted to inform the clinic that Dr. Cletus Gash will not have any additional medical records for the pt because her first appointment with the provider was on 08/25/14. Pt  Stated she will request for more medical records to be sent to Dr. Cletus Gash.  Please contact and discuss, thank you!

## 2014-09-22 ENCOUNTER — Encounter: Payer: Medicare Other | Admitting: Internal Medicine

## 2014-09-30 ENCOUNTER — Telehealth (HOSPITAL_BASED_OUTPATIENT_CLINIC_OR_DEPARTMENT_OTHER): Payer: Self-pay | Admitting: Urology

## 2014-09-30 NOTE — Telephone Encounter (Signed)
CONFIRMED PHONE NUMBER: [phone]   CALLERS FIRST AND LAST NAME: Robin Hart  FACILITY NAME: na TITLE: na  CALLERS RELATIONSHIP:Self  RETURN CALL: Detailed message on voicemail only     ([place]) Texting is an option for this clinic. If you would like [place] to use this option, which mobile phone number should we text to if we are unable to reach you?    SUBJECT: General Message   REASON FOR REQUEST: Medical Records     MESSAGE: Pt wants to verify whether clinic has received any medical records from her previous doctor. Please call back with advise. Thanks!

## 2014-10-03 NOTE — Telephone Encounter (Signed)
Just what is scanned in media.

## 2014-10-08 ENCOUNTER — Encounter (HOSPITAL_BASED_OUTPATIENT_CLINIC_OR_DEPARTMENT_OTHER): Payer: Medicare PPO | Admitting: Urology

## 2014-10-08 ENCOUNTER — Encounter (HOSPITAL_BASED_OUTPATIENT_CLINIC_OR_DEPARTMENT_OTHER): Payer: Self-pay

## 2014-10-08 NOTE — Telephone Encounter (Signed)
Routing to [employer], [place] for record retrieval.    Barnie Mort, RN, BSN, MSN  [facility]

## 2014-10-08 NOTE — Telephone Encounter (Signed)
Error

## 2014-10-08 NOTE — Telephone Encounter (Signed)
Called Dr. Alexander Bergeron office to get records sent over. They informed me that the patient needed to sign a release form for [place] to have access to her records.     I then contacted Dr. Janeann Merl office to have the records he received sent over to [place] and the reception will be sending all her previous notes from Dr. Bobbye Charleston and the clinic notes from Dr. Cletus Gash       I contacted Mrs. Pitt to let her know that we requested records from Dr. Janeann Merl clinic and that they would be sending them prior to her appointment    Closing TE

## 2014-10-15 ENCOUNTER — Ambulatory Visit (INDEPENDENT_AMBULATORY_CARE_PROVIDER_SITE_OTHER): Payer: Medicare PPO | Admitting: Family Practice

## 2014-10-15 VITALS — BP 142/71 | HR 66 | Temp 98.3°F | Resp 18 | Wt 118.6 lb

## 2014-10-15 DIAGNOSIS — J0100 Acute maxillary sinusitis, unspecified: Secondary | ICD-10-CM

## 2014-10-15 MED ORDER — CLARITHROMYCIN 500 MG OR TABS
500.0000 mg | ORAL_TABLET | Freq: Two times a day (BID) | ORAL | Status: AC
Start: 2014-10-15 — End: 2014-10-25

## 2014-10-15 MED ORDER — OXYMETAZOLINE HCL 0.05 % NA SOLN
2.0000 | Freq: Two times a day (BID) | NASAL | Status: AC
Start: 2014-10-15 — End: 2014-10-18

## 2014-10-15 MED ORDER — ACETAMINOPHEN-CODEINE 120-12 MG/5ML OR SOLN
5.0000 mL | Freq: Four times a day (QID) | ORAL | Status: DC | PRN
Start: 2014-10-15 — End: 2014-11-13

## 2014-10-15 NOTE — Progress Notes (Signed)
Reason for Visit: Congestion and cough for 8 days now. Staying the same. Not sure if she has fever since she did not check.         Refills? NO  Referral? NO  Letter or Form? NO  Lab Results? NO    HEALTH MAINTENANCE:        Have you seen a specialist since your last visit: No    Vaccines Due? Yes, Tetanus, Zoster vaccine and  Pneumococcal vaccine    PHQ2 done in the last year (365 days)? Yes    Does patient have eCare?  No    HM Due:   Health Maintenance   Topic Date Due   . Tetanus Vaccine  03[birth year]/1953   . Cholesterol Test  03/05/1986   . Colon Cancer Screen w/ FOBT/FIT  03/06/1991   . Mammogram  03/06/1991   . Zoster Vaccine  03/05/2001   . DEXA Scan  03/05/2006   . Pneumococcal Vaccine (1 of 2 - PCV13) 03/05/2006   . Influenza Vaccine (1) 12/04/2014       PCP Verified?  Yes, Cletus Gash

## 2014-10-15 NOTE — Progress Notes (Signed)
(  Please see information listed by medical assistant below which is reviewed and confirmed  today.  1:11 PM )    Return visit    ID/CC: Robin Hart is a 74 year old female  who presents with the following concerns:   Chief Complaint   Patient presents with   . URI     X8 days   1.5wks of sinus congestion, pressure, teeth pain, coughing and PND. Pt has been using neti pot daily w/o relief.     PMH, PSH, Family history, social history reviewed and amended as appropriate (please see history tabs in EMR.)    Patient Active Problem List   Diagnosis   . Hypertension     Review of Systems   Constitutional: Positive for fatigue. Negative for fever.   HENT: Positive for facial swelling, postnasal drip, rhinorrhea and sinus pressure.    Respiratory: Positive for cough.    Cardiovascular: Negative.    Gastrointestinal: Negative.    Neurological: Positive for headaches.   Psychiatric/Behavioral: Positive for sleep disturbance.     PE:  BP 142/71 mmHg  Pulse 66  Temp(Src) 98.3 F (36.8 C) (Oral)  Resp 18  Wt 118 lb 9.7 oz (53.8 kg)  SpO2 98%  There is no height on file to calculate BMI.    Physical Exam   Constitutional: She is well-developed, well-nourished, and in no distress.   HENT:   Nose: Right sinus exhibits maxillary sinus tenderness. Left sinus exhibits maxillary sinus tenderness.   Mouth/Throat: Oropharynx is clear and moist.   Eyes: Conjunctivae are normal. Pupils are equal, round, and reactive to light.   Neck: Neck supple.   Cardiovascular: Normal rate and regular rhythm.    Pulmonary/Chest: Effort normal and breath sounds normal.   Lymphadenopathy:     She has no cervical adenopathy.   Neurological: She is alert.     Assessment/Plan:     (J01.00) Acute maxillary sinusitis, recurrence not specified  (primary encounter diagnosis)  Plan: afebrile, nontoxic appearing. Likely viral etiology with prolonged sxs now concerning for secondary bacterial infection. Will treat w abx. Clarithromycin 500 MG Oral  Tab, Oxymetazoline  HCl 0.05 % Nasal Solution, Acetaminophen-Codeine 120-12 MG/5ML Oral Solution    After visit summary given to patient.    Mikael Spray, M.D.  Primary Care/Family Medicine  [facility]

## 2014-10-15 NOTE — Patient Instructions (Addendum)
It was a pleasure to see you in clinic today. Your Medical Assistant was: Juliene Pina          You can schedule an appointment to see [place] by calling  [phone] or via eCare.     If labs were ordered today the results are expected to be available via eCare 5 days later. Otherwise, result letters are mailed 7-10 days after your tests are completed. If your physician needs to change your care based on your results, you will receive a phone call to notify you. If you haven't heard from him/her and it has been more than 10 days please give [place] a call.     Thank you for choosing [facility].    You may receive a survey in the mail asking how your experience has been with [facility].  Your input and opinions are very important to [place].  I hope you are able to take the time to complete this survey.  Thank you very much.      Sinusitis (Antibiotic Treatment)    The sinuses are air-filled spaces within the bones of the face. They connect to the inside of the nose.Sinusitisis an inflammation of the tissue lining the sinus cavity. Sinus inflammation can occur during a cold. It can also be due to allergies to pollens and other particles in the air. Sinusitis can cause symptoms of sinus congestion and fullness. A sinus infection causes fever, headache and facial pain. There is often green or yellow drainage from the nose or into the back of the throat (post-nasal drip). You have been given antibiotics to treat this condition.  Home care:   Take the full course of antibiotics as instructed. Do not stop taking them, even if you feel better.   Drink plenty of water, hot tea, and other liquids. This may help thin mucus. It also may promote sinus drainage.   Heat may help soothe painful areas of the face. Use a towel soaked in hot water. Or, stand in the shower and direct the hot spray onto your face. Using a vaporizer along with a menthol rub at night may also help.   Anexpectorantcontaining  guaifenesin may help thin the mucus and promote drainage from the sinuses.   Over-the-counterdecongestantsmay be used unless a similar medicine was prescribed. Nasal sprays work the fastest. Use one that contains phenylephrine or oxymetazoline. First blow the nose gently. Then use the spray. Do not use these medicines more often than directed on the label or symptoms may get worse. You may also use tablets containing pseudoephedrine. Avoid products that combine ingredients, because side effects may be increased. Read labels. You can also ask the pharmacist for help. (NOTE:Persons with high blood pressure should not use decongestants. They can raise blood pressure.)   Over-the-counterantihistaminesmay help if allergies contributed to your sinusitis.    Do not use nasal rinses or irrigation during an acute sinus infection, unless told to by your health care provider. Rinsing may spread the infection to other sinuses.   Use acetaminophen or ibuprofen to control pain, unless another pain medicine was prescribed. (If you have chronic liver or kidney disease or ever had a stomach ulcer, talk with your doctor before using these medicines. Aspirin should never be used in anyone under 50 years of age who is ill with a fever. It may cause severe liver damage.)   Don't smoke. This can worsen symptoms.  Follow-up care  Follow up with your healthcare provider or our  staff if you are not improving within the next week.  When to seek medical advice  Call your healthcare provider if any of these occur:   Facial pain or headache becoming more severe   Stiff neck   Unusual drowsiness or confusion   Swelling of the forehead or eyelids   Vision problems, including blurred or double vision   Fever of100.63F (38C)or higher, or as directed by your healthcare provider   Seizure   Breathing problems   Symptoms not resolving within 10 days   2000-2015 [employer], [place]  [address]. All rights reserved. This information is not intended as a substitute for professional medical care. Always follow your healthcare professional's instructions.

## 2014-10-22 ENCOUNTER — Encounter (HOSPITAL_BASED_OUTPATIENT_CLINIC_OR_DEPARTMENT_OTHER): Payer: Medicare PPO | Admitting: Urology

## 2014-10-29 ENCOUNTER — Ambulatory Visit (INDEPENDENT_AMBULATORY_CARE_PROVIDER_SITE_OTHER): Payer: Medicare PPO | Admitting: Family Medicine

## 2014-10-29 ENCOUNTER — Ambulatory Visit: Payer: Medicare PPO | Attending: Urology | Admitting: Urology

## 2014-10-29 ENCOUNTER — Encounter (INDEPENDENT_AMBULATORY_CARE_PROVIDER_SITE_OTHER): Payer: Self-pay | Admitting: Family Medicine

## 2014-10-29 VITALS — BP 120/70 | HR 64 | Ht 61.0 in | Wt 119.1 lb

## 2014-10-29 VITALS — BP 148/71 | HR 72 | Temp 97.6°F | Resp 17 | Wt 119.0 lb

## 2014-10-29 DIAGNOSIS — J069 Acute upper respiratory infection, unspecified: Secondary | ICD-10-CM

## 2014-10-29 DIAGNOSIS — B9789 Other viral agents as the cause of diseases classified elsewhere: Secondary | ICD-10-CM

## 2014-10-29 DIAGNOSIS — J0100 Acute maxillary sinusitis, unspecified: Secondary | ICD-10-CM

## 2014-10-29 DIAGNOSIS — R32 Unspecified urinary incontinence: Secondary | ICD-10-CM | POA: Insufficient documentation

## 2014-10-29 LAB — URINALYSIS COMPLETE, URN
Bacteria, URN: NONE SEEN
Bilirubin (Qual), URN: NEGATIVE
Epith Cells_Renal/Trans,URN: NEGATIVE /HPF
Glucose Qual, URN: NEGATIVE mg/dL
Ketones, URN: NEGATIVE mg/dL
Nitrite, URN: NEGATIVE
Occult Blood, URN: NEGATIVE
Protein (Alb Semiquant), URN: NEGATIVE mg/dL
RBC, URN: NEGATIVE /HPF
Specific Gravity, URN: 1.008 g/mL (ref 1.002–1.027)
WBC, URN: NEGATIVE /HPF
pH, URN: 6 (ref 5.0–8.0)

## 2014-10-29 MED ORDER — AMOXICILLIN-POT CLAVULANATE 875-125 MG OR TABS
1.0000 | ORAL_TABLET | Freq: Two times a day (BID) | ORAL | Status: DC
Start: 2014-10-29 — End: 2014-11-13

## 2014-10-29 MED ORDER — ALBUTEROL SULFATE HFA 108 (90 BASE) MCG/ACT IN AERS
2.0000 | INHALATION_SPRAY | Freq: Four times a day (QID) | RESPIRATORY_TRACT | Status: DC | PRN
Start: 2014-10-29 — End: 2014-11-13

## 2014-10-29 MED ORDER — ESTRADIOL 0.1 MG/GM VA CREA
1.0000 g | TOPICAL_CREAM | VAGINAL | Status: DC
Start: 2014-10-29 — End: 2015-08-06

## 2014-10-29 MED ORDER — PHENAZOPYRIDINE HCL 200 MG OR TABS
200.0000 mg | ORAL_TABLET | Freq: Three times a day (TID) | ORAL | Status: DC
Start: 2014-10-29 — End: 2014-11-13

## 2014-10-29 NOTE — Patient Instructions (Signed)
1. Pyridium trial-take for 24 hours and see if damp pads-orange. Can place in zip lock bag and bring to next visit if uncertain  2. See physical therapy list  3. Fingertip estrogen application instructions

## 2014-10-29 NOTE — Progress Notes (Signed)
Patient is here with URI symptoms.  She is generally very well but since January she has had 3 episodes of illness.  She has been traveling back and forth to [place] but recently moved here permanently to be closer to her for grandkids.  Early in the year she had upper respiratory symptoms including sinus congestion and cough for which a [place] provider prescribed antibiotics.  The symptoms improved.  Her second episode occurred earlier this month and she was seen here at urgent care October 15, 2014 and was prescribed Biaxin, took 9 days worth and felt better.      Then she started again with cough and sinus congestion a little less than a week ago.  The cough is worse at night and she has a sore throat.  She notes a lot of sinus congestion with drainage down the back of the throat in addition to having to blow her nose frequently.  She believes the sore throat is from the postnasal drip.    Her granddaughter here in the [place] area attends daycare and has URI symptoms frequently, patient presumes her granddaughter is the source of infections.      PHYSICAL EXAM:  BP 148/71 mmHg  Pulse 72  Temp(Src) 97.6 F (36.4 C) (Temporal)  Resp 17  Wt 119 lb (53.978 kg)  SpO2 97%  PF 400 L/min  General: healthy, alert, no distress  Ears: External ears normal. Canals clear. TM's normal.  Oropharynx: Lips, mucosa, and tongue normal. Teeth and gums normal., posterior pharynx without erythema or drainage  Neck: supple. No adenopathy. Thyroid symmetric, normal size, without nodules  Lungs: clear to auscultation  Heart: normal rate, regular rhythm and no murmurs, clicks, or gallops   Ext: No calf swelling or tenderness on either side.  Neuro:  Grossly normal to observation, gait normal    A/P:    (J01.00) Acute maxillary sinusitis, recurrence not specified  (primary encounter diagnosis)  (J06.9,  B97.89) Viral URI with cough  Viral URI with cough and secondary maxillary sinusitis.  Will treat with Augmentin and  albuterol for the cough.  I discussed the possible side effects and expected results from use of both medications.    Needs to follow-up for any new or worsening symptoms.

## 2014-11-04 ENCOUNTER — Encounter (HOSPITAL_BASED_OUTPATIENT_CLINIC_OR_DEPARTMENT_OTHER): Payer: Self-pay | Admitting: Urology

## 2014-11-07 ENCOUNTER — Telehealth (HOSPITAL_BASED_OUTPATIENT_CLINIC_OR_DEPARTMENT_OTHER): Payer: Self-pay | Admitting: Urology

## 2014-11-07 NOTE — Telephone Encounter (Signed)
Pt called clinic questioning if vagifem could be used instead of estrace ordered by Dr. Sabra Heck during her last visit due to cost of medication.   Writer consulted with Dr. Sabra Heck, who states vagifem could be used as an alternative.  VM left for pt informing her of the same.    Barnie Mort, RN, BSN, MSN  [facility]

## 2014-11-08 NOTE — Progress Notes (Signed)
This note was dictated by Cranston Neighbor, MD.  BP 120/70 mmHg  Pulse 64  Ht 5\' 1"  (1.549 m)  Wt 119 lb 1.6 oz (54.023 kg)  BMI 22.52 kg/m2

## 2014-11-08 NOTE — Progress Notes (Signed)
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  The patient is gravida 7, para 3.  Patient reports undergoing a MMK in 1985, a sling in 1997 (she does not think mesh was used), and underwent a vaginal hysterectomy in 1998 secondary to pelvic prolapse.  She does believe that she had benefit for her incontinence from these procedures.  However, over the last several years, she has developed symptoms of urinary frequency, urgency, and urge associated incontinence which is quite bothersome to her, in addition to urinary leakage associated with coughing, sneezing, or laughing, which she finds moderately bothersome.  She also reports some dampness to her pad without awareness of any leakage.  She also reports some difficulty emptying her bladder.  She states she voids about every 2 hours during the day and gets up twice at night because of the sense of needing to go.  Her stream is somewhat slow.  While working and during a very stressful period of time, she tried several different therapies for management of her overactive bladder to include Ditropan, Oxytrol which resulted in a significant skin reaction with only moderate benefit.  Myrbetriq, she developed headaches.  She did undergo a trial of PTNS, which was of no benefit.  She has worked with Lorelei Pont in the past and does report being involved in some sort of a clinical trial her urologist was a Dr. Vikki Ports in [facility], I believe, in [place].  She admitted to drinking a great deal of coffee at that time and knows that this exacerbated her symptoms as well as stress.  She is interested now that she is no longer drinking significant amounts of  coffee and feeling stress from work that perhaps some of these therapies that she has tried in the past might now be more effective for her.  She denies problems with frequent urinary tract infections, no hematuria.  She does report a history of irritable bowel with occasional difficulties controlling loose stool.  She has some sense of fecal urgency.  She denies any sense of pelvic prolapse.  She is not using any estrogen products currently.  She has used it in the past.  She is currently not sexually active.    PAST MEDICAL HISTORY     1. Overactive bladder.  2. Irritable bowel syndrome.  3. Hypertension.    SURGERIES     See above.    CURRENT MEDICATIONS     1. Albuterol sulfate.  2. Timolol hemihydrate ophthalmic solution.  3. Tretinoin external cream.    ALLERGIES     None known.    HABITS     Drinks a fourth a cups of pop a day, a fourth cups of juice a day, 1 to 2 cups of water a day, 1 to 2 cups of milk a day.    HABITS     Nonsmoker, drinks quarter cup of wine per week and half a can of beer a week.    FAMILY HISTORY     Notable for breast cancer, urinary incontinence.    SOCIAL HISTORY  Patient is a retired Network engineer.  She is single.    REVIEW OF SYSTEMS     She notes urinary symptoms as noted above, sinus problems, high blood pressure, IBS.  The rest of the systems were negative.    PHYSICAL EXAMINATION     GENERAL:  Healthy-appearing 74 year old woman in no acute distress.  Affect calm and appropriate.  VITAL SIGNS:  Blood pressure 120/70, pulse of 64, height 5 feet 1 inch, weight 119 pounds with a BMI of 22.  HEENT:  Anicteric.  NECK:  Normal neck range of motion.  CHEST:  Normal excursion without expiratory wheezes.  CARDIOVASCULAR:  Regular rate without peripheral edema or cyanosis.  ABDOMEN:  Soft, nontender to palpation, no hepatosplenomegaly appreciated.  No groin hernias noted.  No groin adenopathy.  PELVIC:  Normal-appearing external genitalia without lesions or excoriations.  Urethral meatus  is prominent with vaginal tissues, although moist, are somewhat pale with decreased rugae.  Uterus and cervix are surgically absent.  Adnexa was clear bilaterally.  The distal anterior vaginal wall is fairly well supported with mild-to-moderate laxity of the more proximal anterior vaginal wall.  Posterior vaginal wall shows mild laxity.  She has no pain on palpation of the pelvic floor muscles.  She is able to localize the muscles somewhat, but the contraction is weak and poorly sustained.  Genital hiatus is approximately 3.5 cm.  Perineal body is 2 cm.  Patient has normal rectal sphincter tone without rectal masses appreciated.    DIAGNOSTIC STUDIES     Postvoid residual by ultrasound was 10 mL.  Urine will be sent for urinalysis.    IMPRESSION     1. Urinary frequency and incontinence.  Predominantly overactive bladder, though does give some clinical history suggestive of stress incontinence as well as some spontaneous leakage without awareness.  2. Vaginal atrophy though with few symptoms.  3. 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    PLAN     1. Patient is interested participating in the LEARN study.  She spoke with our coordinator today.  2. Discussed incontinence management options with the patient.  She is most interested in retrying some of the more conservative therapies that failed her in the past, because before she thinks that her lifestyle was contributing significantly.  Thus, I have referred her to Physical Therapy.  We will start vaginal estrogen.  She is interested in a pessary fitting and will return for this and we will perform a Pyridium pad test in an attempt to better define this dampness.  She will return in approximately 4 weeks' time for a pessary fitting.

## 2014-11-11 ENCOUNTER — Telehealth (INDEPENDENT_AMBULATORY_CARE_PROVIDER_SITE_OTHER): Payer: Self-pay | Admitting: Internal Medicine

## 2014-11-11 NOTE — Telephone Encounter (Signed)
(  TEXTING IS AN OPTION FOR [facility] CL[facility] ONLY)  Is this a [place] clinic? Yes. What is the mobile number we can use to get a hold of you via text? [phone]      RETURN CALL: Detailed message on voicemail only      SUBJECT:  Option 8 ([place])      SUMMARY OF PATIENT'S CONCERN: Patient has been having upper respiratory issues since January 2016 chronic coughing, sore throat and discharge. However patient would like to be seen but first available appointment for Dr. Rachel Moulds is in 01/19/2015. Patient only want to see MD, however SOP states to warm transfer and clinic is currently close. Please contact patient regarding symptoms and scheduling a sooner appointment with MD.

## 2014-11-12 NOTE — Telephone Encounter (Signed)
Scheduled with Dr. Amalia Hailey. Closing.

## 2014-11-13 ENCOUNTER — Encounter (INDEPENDENT_AMBULATORY_CARE_PROVIDER_SITE_OTHER): Payer: Self-pay | Admitting: Family Practice

## 2014-11-13 ENCOUNTER — Ambulatory Visit (INDEPENDENT_AMBULATORY_CARE_PROVIDER_SITE_OTHER): Payer: Medicare PPO | Admitting: Family Practice

## 2014-11-13 VITALS — BP 149/76 | HR 58 | Temp 98.0°F | Resp 16 | Wt 119.7 lb

## 2014-11-13 DIAGNOSIS — J309 Allergic rhinitis, unspecified: Secondary | ICD-10-CM

## 2014-11-13 DIAGNOSIS — J029 Acute pharyngitis, unspecified: Secondary | ICD-10-CM

## 2014-11-13 MED ORDER — DESLORATADINE 5 MG OR TABS
5.0000 mg | ORAL_TABLET | Freq: Every day | ORAL | Status: DC
Start: 2014-11-13 — End: 2015-08-06

## 2014-11-13 MED ORDER — MAGIC MOUTHWASH LIDO-DIPHEN-MAALOX 1:1:1 COMPOUNDED MT SUSP
5.0000 mL | Freq: Four times a day (QID) | OROMUCOSAL | Status: DC | PRN
Start: 2014-11-13 — End: 2015-03-05

## 2014-11-13 NOTE — Progress Notes (Signed)
(  Please see information listed by medical assistant below which is reviewed and confirmed  today.  10:16 AM )    Return visit    ID/CC: Robin Hart is a 74 year old female  who presents with the following concerns:   Chief Complaint   Patient presents with   . URI     Since January. Cough and sore throat, runny nose. Not sure if a new virus or if old one never went away     4day h/o sore throat, recurrent URI sxs, evening cough. Pt has had two full courses of abx for similar sxs, augmentin and clarithromycin. Reports minimal, temporary relief w full return of sxs after abx course. No fevers or GI sxs. Started flonase 2-3 days ago for nasal drainage. Denies undesired side effects.    PMH, PSH, Family history, social history reviewed and amended as appropriate (please see history tabs in EMR.)    Patient Active Problem List   Diagnosis   . Hypertension     Review of Systems   Constitutional: Positive for fatigue. Negative for fever.   HENT: Positive for congestion, postnasal drip, rhinorrhea, sneezing and sore throat.    Respiratory: Positive for cough.    Cardiovascular: Negative.    Gastrointestinal: Negative.    Allergic/Immunologic: Negative for immunocompromised state.   Neurological: Negative.      PE:  BP 149/76 mmHg  Pulse 58  Temp(Src) 98 F (36.7 C) (Temporal)  Resp 16  Wt 119 lb 11.4 oz (54.3 kg)  SpO2 100%  Body mass index is 22.63 kg/(m^2).    Physical Exam   Constitutional: She appears well-developed and well-nourished.   HENT:   Nose: Mucosal edema, rhinorrhea and sinus tenderness present.   Mouth/Throat: Posterior oropharyngeal edema and posterior oropharyngeal erythema present. No oropharyngeal exudate or tonsillar abscesses.   Eyes: Conjunctivae are normal. Pupils are equal, round, and reactive to light.   Neck: Neck supple.   Cardiovascular: Normal rate and regular rhythm.    Pulmonary/Chest: Effort normal and breath sounds normal.   Neurological: She is alert.     Assessment/Plan:        (J02.9) Pharyngitis, unspecified etiology  (primary encounter diagnosis)    (J30.9) Allergic rhinitis, unspecified allergic rhinitis type  Plan: Lidocaine-Diphenhydramine-Maalox (MAGIC         MOUTHWASH COMPOUNDED) Mouth/Throat Suspension,         Desloratadine 5 MG Oral Tab, REFERRAL TO         ALLERGY/IMMUNOLOGY    Patiently recently moved to the [facility] from [place].  Suspect environmental allergy component of symptoms.  Patient is requesting allergy testing.  She will see [facility] allergy and immunology, recommendations appreciated.    After visit summary given to patient.    Mikael Spray, M.D.  Primary Care/Family Medicine  [facility]

## 2014-11-13 NOTE — Patient Instructions (Signed)
When You Have a Sore Throat  A sore throat can be painful. There are many reasons why you may have a sore throat. Your healthcare provider will work with you to find the cause of your sore throat. He or she will also find the best treatment for you.      What Causes a Sore Throat?  Sore throats can be caused or worsened by:   Cold or flu viruses   Bacteria   Irritants such as tobacco smoke   Acid reflux  A Healthy Throat  The tonsils are on the sides of the throat near the base of the tongue. They collect viruses and bacteria and help fight infection. The throat (pharynx) is the passage for air. Mucus from the nasal cavity also moves down the passage.  An Inflamed Throat  The tonsils and pharynx can become inflamed due to a cold or flu virus. Postnasal drip (excess mucus draining from the nasal cavity) can irritate the throat. It can also make the throat or tonsils more likely to be infected by bacteria. Severe, untreated tonsillitis in children or adults can cause a pocket of pus (abscess) to form near the tonsil.  Your Evaluation  A medical evaluation can help find the cause of your sore throat. It can also help your healthcare providerchoose the best treatment for you. The evaluation may include a health history, physical exam, and diagnostic tests.  Health History  Your healthcare provider may ask you the following:   How long has the sore throat lasted and how have you been treating it?   Do you have any other symptoms, such as body aches, fever, or cough?   Does your sore throat recur? If so, how often? How many days of school or work have you missed because of a sore throat?   Do you have trouble eating or swallowing?   Have you been told that you snore or have other sleep problems?   Do you have bad breath?   Do you cough up bad-tasting mucus?  Physical Exam  During the exam, your healthcare provider checks your ears, nose, and throat for problems. He or she also checks for swelling in the  neck, and may listen to your chest.  Possible Tests  Other tests your healthcare provider may perform include:   A throat swab to check for bacteria such asstreptococcus (the bacteria that causes strep throat)   A blood test to check for mononucleosis (a viral infection)   A chest x-ray to rule out pneumonia, especially if you have a cough  Treating a Sore Throat  Treatment depends on many factors. What is the likely cause? Is the problem recent? Does it keep coming back? In many cases, the best thing to do is to treat the symptoms, rest, and let the problem heal itself. Antibiotics may help clear up some infections. For cases of severe or recurring tonsillitis, the tonsils may need to be removed.  Relieving Your Symptoms   Don't smoke, and avoid secondhand smoke.   For children, try throat sprays or Popsicles. Adults and older children may try lozenges.   Drink warm liquids to soothe the throat and help thin mucus. Avoid alcohol, spicy foods, and acidic drinks such as orange juice. These can irritate the throat.   Gargle with warm saltwater (1teaspoon of salt to8ounces of warm water).   Use a humidifier to keep air moist and relieve throat dryness.   Try over-the-counter pain relievers such as acetaminophen or ibuprofen.   Use as directed, and don't exceed the recommended dose. Don't give aspirin to children.   Are Antibiotics Needed?  If your sore throat is due to a bacterial infection, antibiotics may speed healing and prevent complications. But most sore throats are caused by cold or flu viruses. And antibiotics don't treat viral illness. In fact, using antibiotics when they're not needed may produce bacteria that are harder to kill. Your healthcare provider will prescribe antibiotics only if he or she thinks they are likely to help.  If Antibiotics Are Prescribed  Take the medication exactly as directed. Be sure to finish your prescription even if you're feeling better. And be sure to ask your  healthcare provider or pharmacist what side effects are common and what to do about them.  Is Surgery Needed?  In some cases, tonsils need to be removed. This is often done as outpatient (same-day) surgery. Your healthcare provider may advise removing the tonsils in cases of:   Several severe bouts of tonsillitis in a year. "Severe" episodes include those that lead to missed days of school or work, or that need to be treated with antibiotics.   Tonsillitis that causes breathing problems during sleep.   Tonsillitis caused by food particles collecting in pouches in the tonsils (cryptic tonsillitis).  Call your healthcare provider if any of the following occur:   Symptoms worsen, or new symptoms develop.   Swollen tonsils make breathing difficult.   The pain is severe enough to keep you from drinking liquids.   A skin rash, hives, or wheezing develops. Any of these could signal an allergic reaction to antibiotics.   Symptoms don't improve within a week.   Symptoms don't improve within2-3days of starting antibiotics.    [address] [employer] [place], [place] [address]. All rights reserved. This information is not intended as a substitute for professional medical care. Always follow your healthcare professional's instructions.        Allergic Rhinitis  Allergic rhinitis is an allergic reaction that affects the nose, and often the eyes. It's often known asnasal allergies. Nasal allergies are often due to things in the environment that are breathed in. Depending what you are sensitive to, nasal allergies may occur only during certain seasons. Or they may occur year round. Common indoor allergens include house dust mites, mold, cockroaches, and pet dander. Outdoor allergens include pollen from trees, grasses, and weeds.  Symptoms include a drippy, stuffy, and itchy nose. They also include sneezing and red and itchy eyes. You may feel tired more often. Severe allergies may also  affect your breathing and trigger a condition called asthma.  Tests can be done to see what allergens are affecting you. You may be referred to an allergy specialist for testing and further evaluation.  Home care  The healthcare provider may prescribe medicines to help relieve allergy symptoms.  Ask the provider for advice on how to avoid substances that you are allergic to.Below are a few tips for each type of allergen.  Pet dander:   Do not have pets with fur and feathers.   If you cannot avoid having a pet, keep it out of your bedroom and off upholstered furniture.  Pollen:   When pollen counts are high, keep windows of your car and home closed. If possible, use an air conditioner instead.   Wear a filter mask when mowing or doing yard work.  House dust mites:   Wash bedding every week in warm water and detergent  and dry on a hot setting.   Cover the mattress, box spring, and pillows with allergy covers.   If possible, sleep in a room with no carpet, curtains, or upholstered furniture.  Cockroaches:   Store food in sealed containers.   Remove garbage from the home promptly.   Fix water leaks  Mold:   Keep humidity low by using a dehumidifier or air conditioner. Keep the dehumidifier and air conditioner clean and free of mold.   Clean moldy areas with bleach and water.  In general:   Vacuum once or twice a week. If possible, use a vacuum with a high-efficiency particulate air (HEPA) filter.   Do not smoke. Avoid cigarette smoke. Cigarette smoke is an irritant that can make symptoms worse.  Follow-up care  Follow up as advised by the health care provider or our staff. If you were referred to an allergy specialist, make this appointment promptly.  When to seek medical advice  Call your healthcare provider right away if the following occur:   Coughing or wheezing   Fever greater than 100.49F (38C)   Continuing symptoms, new symptoms, or worsening symptoms  Call 911 right awayif you  have:   Trouble breathing   Hives (raised red bumps)   Severe swelling of the face or severe itching of the eyes or mouth   2000-2015 [employer], [place] [address]. All rights reserved. This information is not intended as a substitute for professional medical care. Always follow your healthcare professional's instructions.

## 2014-11-13 NOTE — Progress Notes (Signed)
Preventative Health Care Updates   Since your last visit, please tell us if you have had any of the below services outside of [facility]:     Cervical screening/PAP: NO  If yes, location/date.    Mammo: YES  If yes, location/date.    Colon Screen: YES  If yes, location/ date.    Specialty Care Updates  Have seen a specialist since your last visit: NO   If yes, Name, location and date.    Any forms to complete today? NO       HM Due:   Pneumococcal conjugate (Prevnar - 13), TdaP and Zoster (shingles)-ALL UTD PER PT       Health Maintenance   Topic Date Due   . Tetanus Vaccine  03[birth year]05/1953   . Cholesterol Test  03/05/1986   . Colon Cancer Screen w/ FOBT/FIT  03/06/1991   . Mammogram  03/06/1991   . Zoster Vaccine  03/05/2001   . DEXA Scan  03/05/2006   . Pneumococcal Vaccine (1 of 2 - PCV13) 03/05/2006   . Influenza Vaccine (1) 12/04/2014           Future Appointments  Date Time Provider [place]   11/24/2014 2:30 PM Zia, Rolene Arbour, MD [facility]

## 2014-11-21 ENCOUNTER — Encounter (HOSPITAL_BASED_OUTPATIENT_CLINIC_OR_DEPARTMENT_OTHER): Payer: Self-pay

## 2014-11-21 DIAGNOSIS — M533 Sacrococcygeal disorders, not elsewhere classified: Secondary | ICD-10-CM | POA: Insufficient documentation

## 2014-11-21 DIAGNOSIS — K5989 Other specified functional intestinal disorders: Secondary | ICD-10-CM | POA: Insufficient documentation

## 2014-11-21 DIAGNOSIS — M810 Age-related osteoporosis without current pathological fracture: Secondary | ICD-10-CM | POA: Insufficient documentation

## 2014-11-23 ENCOUNTER — Telehealth (HOSPITAL_BASED_OUTPATIENT_CLINIC_OR_DEPARTMENT_OTHER): Payer: Self-pay | Admitting: Gastroenterology

## 2014-11-23 NOTE — Telephone Encounter (Signed)
(  TEXTING IS AN OPTION FOR [facility] CLINICS ONLY)  Is this a [place] clinic? No      RETURN CALL: General message OK      SUBJECT:  Appointment Request     REASON FOR REQUEST/SYMPTOMS: patient canceled 11-24-14 new patient appointment  REFERRING PROVIDER: n/  REQUEST APPOINTMENT WITH: DR. Domenic Polite AND DR. ZINDIN   REQUESTED DATE: 01/12/15, TIME: WHAT EVER TIME IS OPEN  UNABLE TO APPOINTMENT BECAUSE: PRIVATE APPOINTMENT SLOTS

## 2014-11-24 ENCOUNTER — Encounter (HOSPITAL_BASED_OUTPATIENT_CLINIC_OR_DEPARTMENT_OTHER): Payer: Medicare PPO | Admitting: Gastroenterology

## 2014-11-24 NOTE — Telephone Encounter (Signed)
Spoke w/ pt who will call back to schedule at a more convenient time.

## 2014-12-26 ENCOUNTER — Ambulatory Visit (INDEPENDENT_AMBULATORY_CARE_PROVIDER_SITE_OTHER): Payer: Self-pay | Admitting: Internal Medicine

## 2014-12-26 NOTE — Telephone Encounter (Signed)
CONFIRMED PHONE NUMBER: [phone]  CALLERS FIRST AND LAST NAME: Robin Hart  FACILITY NAME: na TITLE: na  CALLERS RELATIONSHIP:Self  RETURN CALL: General message OK    Patient is in the midst of her 4th URI this year. Her symptoms are almost exactly the same as last one, and so she is treating it as such. She is concerned about the fact that she has had so many this year.

## 2014-12-26 NOTE — Telephone Encounter (Signed)
Protocol: COLDS-ADULT-OH  Negative: SEVERE difficulty breathing (e.g., struggling for each breath, speaks in single words)  Negative: Very weak (can't stand)  Negative: Sounds like a life-threatening emergency to the triager  Negative: Runny nose is caused by pollen or other allergies  Negative: Cough is the main symptom  Negative: Sore throat is the main symptom  Negative: Patient sounds very sick or weak to the triager  Negative: Fever > 103 F (39.4 C)  Negative: Fever > 100.5 F (38.1 C) and over 60 years of age  Negative: Fever > 100.5 F (38.1 C) and has diabetes mellitus or a weakened immune system (e.g., HIV positive, cancer chemotherapy, organ transplant, splenectomy, chronic steroids)  Negative: Fever > 100.5 F (38.1 C) and bedridden (e.g., nursing home patient, stroke, chronic illness, recovering from surgery)  Negative: Fever present > 3 days (72 hours)  Negative: Fever returns after gone for over 24 hours and symptoms worse or not improved  Negative: Sinus pain (not just congestion) and fever  Negative: Earache  Negative: Sinus congestion (pressure, fullness) present > 10 days  Negative: Nasal discharge present > 10 days  Negative: Using nasal washes and pain medicine > 24 hours and sinus pain (lower forehead, cheekbone, or eye) persists  Negative: Patient wants to be seen  Negative: Sore throat present > 5 days  Affirmative: Colds with no complications  Disposition of Home Care suggested.

## 2014-12-29 ENCOUNTER — Encounter (INDEPENDENT_AMBULATORY_CARE_PROVIDER_SITE_OTHER): Payer: Self-pay | Admitting: Nurse Practitioner

## 2014-12-29 ENCOUNTER — Ambulatory Visit (INDEPENDENT_AMBULATORY_CARE_PROVIDER_SITE_OTHER): Payer: Medicare PPO | Admitting: Nurse Practitioner

## 2014-12-29 VITALS — BP 167/78 | HR 66 | Temp 99.0°F | Resp 14 | Wt 117.8 lb

## 2014-12-29 DIAGNOSIS — B9789 Other viral agents as the cause of diseases classified elsewhere: Secondary | ICD-10-CM

## 2014-12-29 DIAGNOSIS — J069 Acute upper respiratory infection, unspecified: Secondary | ICD-10-CM

## 2014-12-29 NOTE — Progress Notes (Signed)
(  Please see information listed by medical assistant below which is reviewed and confirmed  today.  2:42 PM )      Robin Hart is a 74 year old female  who presents with the following concerns:    Chief Complaint   Patient presents with   . URI     Robin Hart is here today c/o productive cough, congestion, nasal drainage and malaise x 8 days.  Reports that this has been recurrent at least 4 times since January.  Current sx started x 8 days ago.         HPI   Patient reports she has had for upper respiratory infection this year.  Patient reports she has had 8 days of upper respiratory symptoms now, she is having cough with copious phlegm.  10 post nasal drip.  Reports her throat is very sore.    Patient brings in medication from previous, Mucinex D, Flonase, acetaminophen with codeine, albuterol, nettypot, and Desloratadine   Patient is not sure when to use all these medication, expresses frustration why she is having frequent URIs.  She reports she is planning to see allergist soon to determine the cause of her  frequent URIs.        PMH, PSH, Family history, social history reviewed and amended as appropriate (please see history tabs in EMR.)    Patient Active Problem List   Diagnosis   . Hypertension   . Irritable bowel syndrome with diarrhea   . Sacroiliac joint dysfunction   . Osteoporosis       ROS  See HPI, remaining ROS neg    PE:  CONSTITUTIONAL/GENERAL:      BP 167/78 mmHg  Pulse 66  Temp(Src) 99 F (37.2 C) (Temporal)  Resp 14  Wt 117 lb 12.8 oz (53.434 kg)  SpO2 98%  Body mass index is 22.27 kg/(m^2).       Physical Exam   Constitutional: No distress.   Cardiovascular: Normal rate and regular rhythm.    No murmur heard.  Pulmonary/Chest: Effort normal and breath sounds normal.         A/P  74 year old female with frequent URIs.  Patient already has over-the-counter and prescribed medications for symptom management of uncomplicated URIs.  Patient does not have fever, lungs are clear.          Discussed about when to use over-the-counter medications.  Discussed about duration of typical URI durations, when to return to clinic for further evaluation.  I've advised patient to use Flonase and antihistamine in the morning,  Patient continues acetaminophen with codeine at night.  Can use Mucinex q6 hrs  and albuterol as needed no more than 3 times daily.    Patient will continue to use nasal flush twice a day.         Please note:   Portions of this chart were created using Dragon voice recognition software. Occasional wrong-word or "sound-alike" substitutions may have occurred due to the inherent limitations of voice recognition software.

## 2014-12-29 NOTE — Progress Notes (Signed)
Reason for visit: URI       Cervical screening/PAP: NA  Mammo:  Over due, discuss today.    Colon Screen: Over due, Discuss today.    Have you seen a specialist since your last visit: NO      If  Name and location and date.     HM Due:   Health Maintenance   Topic Date Due   . Tetanus Vaccine  03[birth year]/1953   . Cholesterol Test  03/05/1986   . Colon Cancer Screen w/ FOBT/FIT  03/06/1991   . Mammogram  03/06/1991   . Zoster Vaccine  03/05/2001   . DEXA Scan  03/05/2006   . Pneumococcal Vaccine (1 of 2 - PCV13) 03/05/2006   . Influenza Vaccine (1) 12/04/2014              Future Appointments  Date Time Provider [place]   12/29/2014 1:20 PM Creed Copper, ARNP [place]

## 2015-01-22 ENCOUNTER — Ambulatory Visit: Payer: Medicare PPO | Admitting: Urology

## 2015-01-30 ENCOUNTER — Other Ambulatory Visit: Payer: Self-pay

## 2015-02-11 ENCOUNTER — Encounter (INDEPENDENT_AMBULATORY_CARE_PROVIDER_SITE_OTHER): Payer: Self-pay

## 2015-02-12 ENCOUNTER — Ambulatory Visit (HOSPITAL_BASED_OUTPATIENT_CLINIC_OR_DEPARTMENT_OTHER): Admit: 2015-02-12 | Discharge: 2015-02-12 | Disposition: A | Discharge status: Home / Self Care | Payer: Self-pay

## 2015-02-12 ENCOUNTER — Ambulatory Visit (INDEPENDENT_AMBULATORY_CARE_PROVIDER_SITE_OTHER): Payer: Medicare PPO

## 2015-02-12 DIAGNOSIS — Z23 Encounter for immunization: Secondary | ICD-10-CM

## 2015-02-12 NOTE — Progress Notes (Signed)
Vaccine Screening Questions      1.  Have you had a serious reaction or an allergic reaction to a vaccine?  NO    2.  Currently have a moderate or severe illness, including fever? (Don't Ask if vaccine   ordered by provider same day)  NO    3.  Ever had a seizure or a brain or other nervous system problem syndrome associated with a vaccine? (DTaP/TDaP/DTP pertinent) NO    4.  Is patient receiving  any live vaccinations today? (Varicella-Chickenpox, MMR-Measles/Mumps/Rubella, Zoster-Shingles)  NO    If YES to any of the questions above - Do NOT give vaccine.  Consult with RN or provider in clinic.  (#4 can be YES if all Live vaccine questions are answered NO)    If NO to all questions above - Patient may receive vaccine.    5.  Do you need to receive the Flu vaccine today? YES - Additional Flu Questions  Flu Vaccine Screening Questions:    Ever had a serious allergic reaction to eggs?  NO    Ever had Guillain-Barre syndrome associated with a vaccine? NO    Less than 6 months old? NO    If YES to any of the Flu questions above - NO Flu Vaccine to be given.  Patient may consult provider as needed.    If NO to all questions above - Patient may receive Flu Shot (IM)    If between 6 months and 8 years of age, was flu vaccine received last year?  N/A  If NO to above question:  . Children who are receiving influenza vaccine for the first time - administer 2 doses of the current influenza vaccine (separated by at least 4 weeks).        Vaccine information sheet(s) discussed, patient/parent/guardian verbalized understanding? YES     VIS given 02/12/2015 by AURAWUN FERN CIRCLE, MA MA.      Immunization Documentation:    Patient/Parent has reviewed the appropriate VIS and has all questions answered? YES    Vaccine(s) given today without initial adverse effect? YES    AURAWUN FERN CIRCLE, MA

## 2015-02-14 ENCOUNTER — Other Ambulatory Visit: Payer: Self-pay

## 2015-03-05 ENCOUNTER — Encounter (INDEPENDENT_AMBULATORY_CARE_PROVIDER_SITE_OTHER): Payer: Self-pay | Admitting: Internal Medicine

## 2015-03-05 ENCOUNTER — Ambulatory Visit (INDEPENDENT_AMBULATORY_CARE_PROVIDER_SITE_OTHER): Payer: Medicare PPO | Admitting: Internal Medicine

## 2015-03-05 VITALS — BP 136/76 | HR 58 | Temp 98.6°F | Resp 16

## 2015-03-05 DIAGNOSIS — F5104 Psychophysiologic insomnia: Secondary | ICD-10-CM | POA: Insufficient documentation

## 2015-03-05 DIAGNOSIS — N3281 Overactive bladder: Secondary | ICD-10-CM | POA: Insufficient documentation

## 2015-03-05 DIAGNOSIS — F5101 Primary insomnia: Secondary | ICD-10-CM

## 2015-03-05 MED ORDER — MIRTAZAPINE 15 MG OR TABS
ORAL_TABLET | ORAL | Status: DC
Start: 2015-03-05 — End: 2015-03-06

## 2015-03-05 NOTE — Progress Notes (Signed)
Return visit    ID/CC: Robin Hart is a 74 year old female  who presents with the following concerns:     1) Knee: end of august she tripped and banged it into the wall. R leg black and blue from knee to ankle.   Still hurts in the top of the L knee.   No bruise.   Doesn't limit walking or sleeping.     2) Her main concern is Insomnia: grades as an "F"  Sleeps 3-4 hrs, (10-2a) up for a few, not always able to go back to sleep  Sometimes eyes pop open, wide awake, no cause  Sometimes nocturia wakes her  In the middle of the night she stays in bed, reads or FBs  During the day she's fatigued and can fall asleep w/o much effort  She does nap during the day, but may only nap for 5-30 minutes at a time  Has been managing for 30 years, but getting progressively worse  Tried melatonin and sleep hygeine  Not interested in pills    PMH, PSH, Family history are reviewed and amended as appropriate     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 Minimal EtOH.    Review of Systems   Gastrointestinal:        Irritable bowel      PE:    Filed Vitals:    03/05/15 1413   BP: 136/76   Pulse: 58   Temp: 98.6 F (37 C)   TempSrc: Temporal   Resp: 16   SpO2: 97%     There is no weight on file to calculate BMI.    Physical Exam   Constitutional: She is well-developed, well-nourished, and in no distress.   Pulmonary/Chest: Effort normal.   Musculoskeletal:   The knees are cool, symmetric, w/o deformities   no crepitus b/l  No joint line tenderness  No effusions  No tenderness along the R patellar tendon, or the R suprapatellar bursa  The quads, particularly the medial heads are symmetric and nontender b/l     Neurological: She is alert. Gait normal.   Psychiatric: Mood, memory, affect and judgment normal.       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  No clear physical or psychological cause. We discussed sleep consult--can refer at any time but I  do not think sleep study will be fruitful, as she has no sx of obstruction, or RLS, or narcolepsy.    Discussed CBT for insomnia. She will consider a web-program. Has some experience w/ CBT.     Can try mirtazapine if she would like. Although it is better for sleep onset than sleep maintenance, it has a favorable side effect profile.    Diagnoses and all orders for this visit:    Primary insomnia  -     Mirtazapine 15 MG Oral Tab; 0.5-1 tablet by mouth as needed for insomnia.    Other orders  -     ZEBRA LABELS      I spent a total time of 25 minutes face-to-face with the patient, of which more than 50% was spent counseling and coordinating care as outlined in this note.

## 2015-03-05 NOTE — Progress Notes (Signed)
Reason for Visit: Patient tripped and hit her knee in august, states knee is still in pain, (r Knee) Establish care    Refills? NO  Referral? NO  Letter or Form? NO  Lab Results? NO    HEALTH MAINTENANCE:  Has the patient has this done since their last visit?  Cervical screening/PAP: N/A  Mammo: N/A  Colon Screen: N/A  Diabetic Eye Exam (If applicable): N/A      Have you seen a specialist since your last visit: No    Vaccines Due? Unknown    PHQ2 done in the last year (365 days)? YES     Does patient have eCare?  Active     HM Due:   Health Maintenance   Topic Date Due   . Tetanus Vaccine  03[birth year]05/1953   . Cholesterol Test  03/05/1986   . Colon Cancer Screen w/ FOBT/FIT  03/06/1991   . Mammogram  03/06/1991   . Zoster Vaccine  03/05/2001   . DEXA Scan  03/05/2006   . Pneumococcal Vaccine (1 of 2 - PCV13) 03/05/2006   . Influenza Vaccine  Completed       PCP Verified?  Yes, [place]

## 2015-03-06 MED ORDER — MIRTAZAPINE 15 MG OR TABS
7.5000 mg | ORAL_TABLET | Freq: Every evening | ORAL | Status: DC | PRN
Start: 2015-03-06 — End: 2015-08-06

## 2015-03-06 NOTE — Addendum Note (Signed)
Addended by: Samella Parr on: 03/06/2015 04:52 PM     Modules accepted: Orders

## 2015-03-07 ENCOUNTER — Telehealth (INDEPENDENT_AMBULATORY_CARE_PROVIDER_SITE_OTHER): Payer: Self-pay | Admitting: Internal Medicine

## 2015-03-07 NOTE — Telephone Encounter (Signed)
ROI Received on: 03/06/15    Request Sent to: [employer]    Records needed: all    Request was sent via: Fax; faxed to [phone]    Contact number for follow up: [phone]

## 2015-03-16 ENCOUNTER — Ambulatory Visit (HOSPITAL_BASED_OUTPATIENT_CLINIC_OR_DEPARTMENT_OTHER): Payer: Self-pay

## 2015-03-31 ENCOUNTER — Telehealth (HOSPITAL_BASED_OUTPATIENT_CLINIC_OR_DEPARTMENT_OTHER): Payer: Self-pay | Admitting: Gastroenterology

## 2015-03-31 NOTE — Telephone Encounter (Signed)
(  TEXTING IS AN OPTION FOR [facility]IN[facility] ONLY)  Is this a [place] clinic? No      RETURN CALL: Detailed message on voicemail only      SUBJECT:  Appointment Request     REASON FOR REQUEST/SYMPTOMS: IBS  REFERRING PROVIDER: Nurse from a clinical trial at [facility]: Dr. Domenic Polite  REQUESTED DATE: Any time is okay per patient, TIME: 10AM-10:30AM is best because of transportation  UNABLE TO APPOINT BECAUSE: no schedule available

## 2015-04-03 ENCOUNTER — Telehealth (HOSPITAL_BASED_OUTPATIENT_CLINIC_OR_DEPARTMENT_OTHER): Payer: Self-pay | Admitting: Urology

## 2015-04-03 NOTE — Telephone Encounter (Signed)
Patient called would like to have Dr. Sabra Heck write her referral to a Urologist closer to her home. Please advise patient.

## 2015-04-03 NOTE — Telephone Encounter (Signed)
Urologists in/near [place]: Myrtie Soman, MD [phone], Kristian Covey, MD [phone]. I don't believe she needs a referral to see either or these providers. Dr. Vidal Schwalbe might be able to view my note electronically since she is at [facility], otherwise she may want to get a copy of my note to take with her.

## 2015-04-03 NOTE — Telephone Encounter (Signed)
Situation:  Patient is requesting a referral from Dr. Sabra Heck to see a urologist near her home in [place].   Background:  Patient saw Dr. Sabra Heck on 10/29/14 for incontinence.  Assessment:  Patient would like a referral from Dr. Sabra Heck to a urologist in [place] around the [place] area. Patient does not know of one around there, was hoping Dr. Sabra Heck could recommend someone and generate the referral.   Recommendation:  Sending TE to Dr. Sabra Heck for her response.

## 2015-04-03 NOTE — Telephone Encounter (Signed)
Gave patient Dr. Ammie Ferrier response from below. Patient requested to have OV note mailed to her. Mailed OV note from 10/29/14 to patient's address on file.

## 2015-04-30 ENCOUNTER — Encounter (INDEPENDENT_AMBULATORY_CARE_PROVIDER_SITE_OTHER): Payer: Self-pay | Admitting: Family Medicine

## 2015-04-30 ENCOUNTER — Ambulatory Visit (INDEPENDENT_AMBULATORY_CARE_PROVIDER_SITE_OTHER): Payer: Medicare PPO | Admitting: Family Medicine

## 2015-04-30 VITALS — BP 157/73 | HR 66 | Temp 99.3°F | Resp 16 | Wt 118.2 lb

## 2015-04-30 DIAGNOSIS — J0100 Acute maxillary sinusitis, unspecified: Secondary | ICD-10-CM

## 2015-04-30 DIAGNOSIS — J209 Acute bronchitis, unspecified: Secondary | ICD-10-CM

## 2015-04-30 DIAGNOSIS — J45909 Unspecified asthma, uncomplicated: Secondary | ICD-10-CM

## 2015-04-30 MED ORDER — AEROCHAMBER MV MISC
1.0000 | Status: DC
Start: 2015-04-30 — End: 2016-04-28

## 2015-04-30 MED ORDER — AZITHROMYCIN 250 MG OR TABS
ORAL_TABLET | ORAL | Status: DC
Start: 2015-04-30 — End: 2015-08-06

## 2015-04-30 MED ORDER — ALBUTEROL SULFATE (2.5 MG/3ML) 0.083% IN NEBU
2.5000 mg | INHALATION_SOLUTION | Freq: Once | RESPIRATORY_TRACT | Status: AC
Start: 2015-04-30 — End: 2015-04-30
  Administered 2015-04-30: 2.5 mg via RESPIRATORY_TRACT

## 2015-04-30 NOTE — Progress Notes (Signed)
Chief Complaint   Patient presents with   . URI     x 1 week- Very productive Cough, copius nasal discharge, Has had 5 URIs in past years, chills.        Subjective:     Robin Hart is a 75 year old female who presents today to the [facility] for URI symptoms.  Started 8 days ago with productive cough, nasal discharge, post nasal drip.  Coughing has occurred day and night.  Denies SOB.  Also with muscle aches and chills, has not measured temperature.    No sinus pressure.  Some ear plugging.    This is her 5th URI in the past year, since she moved here from [place].  She lives with her grandchildren and presumes that her illnesses have come from them.    Flu vaccine 02/12/2015  Using Netti pot, albuterol, codeine cough suppressant.  Has tried her albuterol but cannot tell it helps.    Objective:   PHYSICAL EXAM:  BP 157/73 mmHg  Pulse 66  Temp(Src) 99.3 F (37.4 C) (Temporal)  Resp 16  Wt 118 lb 2.7 oz (53.6 kg)  SpO2 97%  General: healthy, no distress, alert and oriented x 3  Skin: Skin color normal. No rashes or concerning lesions  Eyes: Lids/periorbital skin normal, Conjunctivae/corneas clear  Ears: External ears normal. Canals clear. TM on right is dull, left TM with clear effusion  Oropharynx: Lips, mucosa, and tongue normal. Teeth and gums normal.  Posterior pharynx with mild erythema.  No exudates, cobblestoning or drainage.  Neck: supple. No adenopathy.   Lungs: Clear to auscultation, no rales and no wheezes bilaterally.  Heart: normal rate, regular rhythm and no murmurs, clicks, or gallops  Ankles:  No edema  Neuro: Grossly intact      Assessment and Plan:     (J20.9,  J45.909) Bronchitis with asthma, acute  (primary encounter diagnosis)  (J01.00) Acute maxillary sinusitis, recurrence not specified  URI with cough and secondary maxillary sinusitis.  I suggested she continue with her albuterol and prescribed a spacer to help.  I also prescribed azithromycin to treat her sinusitis  symptoms, this has been effective in the past.    Recheck immediately for any new or worsening symptoms.      Active Ambulatory Problems     Diagnosis Date Noted   . Hypertension 07/07/2014   . 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 Osteoporosis 11/21/2014   . OAB (overactive bladder) 03/05/2015   . Primary insomnia 03/05/2015     Resolved Ambulatory Problems     Diagnosis Date Noted   . No Resolved Ambulatory Problems     Past Medical History   Diagnosis Date   . Cancer ([place])    . Lipidemia    . History of irritable bowel syndrome    . History of diarrhea    . Arthritis    . Headache    . Glaucoma    . Diverticulosis    . H. pylori infection        Patient's Medications   New Prescriptions    No medications on file   Previous Medications    DESLORATADINE 5 MG ORAL TAB    Take 1 tablet (5 mg) by mouth daily.    ESTRADIOL 0.1 MG/GM VAGINAL CREAM    Place 1 g into the vagina 3 times a week.    LATANOPROST 0.005 % OPHTHALMIC SOLUTION  Place 1 drop in each EYE at bedtime.    MIRTAZAPINE 15 MG ORAL TAB    Take 0.5 tablets (7.5 mg) by mouth at bedtime as needed (insomnia).    TIMOLOL MALEATE 0.5 % OPHTHALMIC GEL FORMING SOLUTION    Place 1 drop in each EYE every morning.   Modified Medications    No medications on file   Discontinued Medications    No medications on file       Review of patient's allergies indicates:  Allergies   Allergen Reactions   . Caffeine      Other reaction(s): Nausea Only  Extreme nervousness

## 2015-05-02 ENCOUNTER — Ambulatory Visit: Payer: Self-pay

## 2015-05-02 NOTE — Telephone Encounter (Signed)
S/w pt who was seen at [facility] 01/26   Now on her 10th day of URI, deep cough, sinus drainage and congestion, ears feeling really full.   Using her inhaler, placed on azithromycin 01/26.  Feels a little bit worse (ears are new sx)     PLAN: pt will wait 24hrs to see if abx are helping. If she does not feel better she plans to return to [facility] tomorrow.

## 2015-05-02 NOTE — Telephone Encounter (Signed)
Protocol: COLDS-ADULT-AH  Affirmative: [1] Nasal discharge AND [2] present > 10 days  Disposition of See PCP When Office is Open (Within 3 Days)  suggested.

## 2015-05-02 NOTE — Telephone Encounter (Signed)
-----   Message from Estelle June sent at 05/02/2015  2:18 PM PST -----  Regarding: [place] pt- Pt is on an antibiotic for upper resp. Infection  meds are not really working   >> Estelle June 05/02/2015 02:18 PM  [place] pt- Pt is on an antibiotic for upper resp. Infection  meds are not really working

## 2015-05-04 ENCOUNTER — Other Ambulatory Visit: Payer: Self-pay | Admitting: Internal Medicine

## 2015-05-04 ENCOUNTER — Ambulatory Visit: Payer: Medicare PPO | Attending: Gastroenterology | Admitting: Gastroenterology

## 2015-05-04 ENCOUNTER — Ambulatory Visit: Payer: Medicare PPO | Attending: Internal Medicine

## 2015-05-04 ENCOUNTER — Encounter (HOSPITAL_BASED_OUTPATIENT_CLINIC_OR_DEPARTMENT_OTHER): Payer: Self-pay | Admitting: Gastroenterology

## 2015-05-04 VITALS — BP 146/58 | HR 59 | Temp 99.0°F | Ht 61.0 in | Wt 117.2 lb

## 2015-05-04 DIAGNOSIS — Z1231 Encounter for screening mammogram for malignant neoplasm of breast: Secondary | ICD-10-CM

## 2015-05-04 DIAGNOSIS — M6289 Other specified disorders of muscle: Secondary | ICD-10-CM

## 2015-05-04 DIAGNOSIS — K5904 Chronic idiopathic constipation: Secondary | ICD-10-CM | POA: Insufficient documentation

## 2015-05-04 DIAGNOSIS — R110 Nausea: Secondary | ICD-10-CM | POA: Insufficient documentation

## 2015-05-04 DIAGNOSIS — N8184 Pelvic muscle wasting: Secondary | ICD-10-CM | POA: Insufficient documentation

## 2015-05-04 NOTE — Patient Instructions (Addendum)
You were seen in GI clinic with Dr. Domenic Polite for your nausea, and alternating constipation and diarrhea.     We recommend a workup for pelvic floor dysfunction including ruling out rectal prolapse with an MRI defecography.     We would also recommend EGD/colonoscopy to workup the nausea and other bowel issues.     When we fully evaluate you with these tests we would like to see you back in clinic and then consider treatment or further testing in about 12 weeks.    I've also placed a referral to pelvic floor PT so your therapist can focus on any pelvic floor issues related to your stooling issues.

## 2015-05-04 NOTE — Addendum Note (Signed)
Addended by: Ward Givens on: 05/04/2015 10:24 PM     Modules accepted: Level of Service

## 2015-05-04 NOTE — Progress Notes (Addendum)
INITIAL GASTROENTEROLOGY CLINIC NOTE    DATE OF SERVICE: 05/04/2015  REFERRING PHYSICIAN: Samella Parr, MD  REASON FOR REFERRAL: Nausea, bloating, constipation  Patient is seen in consultation at the request of Dr. Rachel Moulds, Mathews Robinsons, MD for evaluation of chronic constipation and nausea.     ID/CC:  75 year old female with urge incontinence, and previously diagnosed with IBS treated with laxatives, presenting for initial consultation.     HISTORY OF PRESENT ILLNESS:  Robin Hart has a history of longstanding bowel issues bothering her for about 50-60 years most recently diagnosed as IBS and treated with laxatives for constipation. She also has a history of overactive bladder/urge incontinence, glaucoma, osteoporosis, sacroiliac joint dysfunction and s/p hysterectomy for uterine prolapse. She reports 3 vaginal deliveries and associated tearing with the first birth.     Endorses long-standing bowel issues since her late teens and early adulthood.  She is chronic constipation but reports about 18 months ago new alternating consultation plus diarrhea where she endorses 3 days of no stooling followed by 1 day of high-frequency stooling with some urgency.  She will noticed that certain physical activities like hiking will increase her fecal urgency and she's had problems with incontinence when she does not have a bathroom available.  She endorses chronic urinary incontinence and follows closely with the urologist.  She plans to initiate on pelvic floor physical therapy in the next week for her urinary issues.  She does remark that her stooling is characterized by significant straining but she does not require perineal or intravaginal support.    She denies any weight loss, fever, chills, night sweats, anorexia, bleeding in stool, or significant abdominal pain.  She does endorse severe nausea without vomiting and noticeable abdominal bloating and distention that occurs within a few minutes of ingesting  certain "trigger foods". Previous trial of omeprazole was not helpful. She identifies more symptoms with ingestion of chocolate, seeds, high fiber, high fat.  She'll within a few minutes of ingestion noticed some significant nausea without vomiting which will potentially be alleviated by ingestion of Coca-Cola.  On the very rare instances of nausea with vomiting she'll vomit foodstuffs from that day never more than one day ago.    She is a history of colorectal cancer screening with colonoscopy she believes that the last 3-5 years.  She does have a history of colonic polyps.  She denies any history of enterography CT or MR.  No history of upper endoscopy as far she can recall.    She reports her therapeutic goals as such is improving her quality of life and management of her symptoms such that they would not preclude her leisure activities including hiking.  She likes to minimize medication and has a history of multiple tries over-the-counter medications other which she found particularly helpful. Miralax at a low dose was helpful. She has previously tried linzess for 2 doses (about 2 years ago) but had severe diarrhea and low volume bleeding that seemed hemorrhoidal to her.         RELEVANT STUDIES:  CT abd with contrast 08/07/13  FINDINGS:Lung bases are clear. No pericardial fluid. There is a low-densitylesion in the right hepatic lobe measuring 9 mm which does not havesimple fluid attenuation but cannot be further characterized. Thislesion is unchanged from CT of 05/30/2003. The gallbladder,pancreas, adrenal glands are normal. There are multiple granulomatawithin the spleen. The kidneys are normal.The stomach, small bowel, appendix, cecum normal. The colon andrectosigmoid colon are normal. There are several diverticula  of thesigmoid colon without acute inflammation.There is no significant umbilical hernia. Tiny left inguinal hernia.Abdominal or is normal caliber. No retroperitoneal periportallymphadenopathy. . No  free fluid the pelvis. Post hysterectomyanatomy. Postsurgical change at the bladder neck. No pelviclymphadenopathy. Adnexa are normal. No aggressive osseous lesion.IMPRESSION:1. No explanation for periumbilical pain. No ventral hernia orsignificant inguinal hernia.2. Normal volume of stool.3. Normal appendix.4. Post hysterectomy.    Endoscopy 2012: report not available    RUQ u/s 01/26/2010: normal     CURRENT AND PRIOR THERAPIES:  Linzess: tried 2 doses 2015, intolerant to diarrhea  Amitiza, "low dose trial" 08/23/13  Probiotic: align  Dietary modification: gluten free, low fat, seeds/high fiber, chocolate avoidance  Fiber - not currently using, worsens nausea/bloating  miralax - 17g per day, loosens stool, stopped to minimize meds  prunes      PAST MEDICAL & SURGICAL HISTORY:  Active Ambulatory Problems     Diagnosis Date Noted   . Hypertension 07/07/2014   . 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 Osteoporosis 11/21/2014   . OAB (overactive bladder) 03/05/2015   . Primary insomnia 03/05/2015     Resolved Ambulatory Problems     Diagnosis Date Noted   . No Resolved Ambulatory Problems     Past Medical History   Diagnosis Date   . Cancer ([place])    . Lipidemia    . History of irritable bowel syndrome    . History of diarrhea    . Arthritis    . Headache    . Glaucoma    . Diverticulosis    . H. pylori infection    . Constipation      Urge incontinence; tx posterior tibial nerve stimulator 2014, antimuscarinics, myrbetriq, (prev rec'd interstim, botox for refractory sx)  Chronic headache  IBS  HTN  Glaucoma  OA  3 vaginal births, +tearing  Colon polyps: last colo 2014?    [place]  Hysterectomy 1996  Bladder suspension 1985, 1995      ALL:  Review of patient's allergies indicates:  Allergies   Allergen Reactions   . Caffeine      Other reaction(s): Nausea Only  Extreme nervousness        MEDS:  Outpatient Prescriptions Prior to Visit   Medication Sig Dispense Refill    . Azithromycin 250 MG Oral Tab Take 2 tablets today, then take 1 tablet every day until gone. 6 tablet 0   . Desloratadine 5 MG Oral Tab Take 1 tablet (5 mg) by mouth daily. 30 tablet 1   . Estradiol 0.1 MG/GM Vaginal Cream Place 1 g into the vagina 3 times a week. 42.5 g 3   . Latanoprost 0.005 % Ophthalmic Solution Place 1 drop in each EYE at bedtime.  1   . Mirtazapine 15 MG Oral Tab Take 0.5 tablets (7.5 mg) by mouth at bedtime as needed (insomnia). 30 tablet 0   . Spacer/Aero-Holding Chambers (AEROCHAMBER MV) Misc Use. 1 each 0   . Timolol Maleate 0.5 % Ophthalmic GEL FORMING SOLUTION Place 1 drop in each EYE every morning.  1     No facility-administered medications prior to visit.       REVIEW OF SYSTEMS:  ROS as documented on the Patient Information Form completed by the patient and this was reviewed with the patient.    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  Mother with "spastic colon"     PHYSICAL EXAMINATION:  Filed Vitals:    05/04/15 1146   BP: 146/58   Pulse: 59   Temp: 99 F (37.2 C)   TempSrc: Temporal   Height: 5\' 1"  (1.549 m)   Weight: 117 lb 3.2 oz (53.162 kg)   SpO2: 98%     GEN: well-kept, well-developed, normal weight, NAD  HEENT: anicteric, atraumatic, MMM, non-erythematous OP  NECK: supple  CHEST: CTA bilat  CV: nl s1, s2 with no MRG, no JVD  ABD: +BS, soft, NT, NG, non-tympanic, no hepatosplenomegaly  RECTAL: +anal wink, normal sensation to cotton tip, on external examination: normal squeeze with pelvic floor ascent and with pseudodefecation, normal effacement and pelvic floor descent.  With finger insertion, normal squeeze/sphincter tone and intact pelvic floor descent and effacement, no masses. Multiple small pieces  of hard stool palpated in the rectal vault.   EXTR: no LE edema bilat  SKIN: no rashes  NEURO: A&Ox3, MAEE, nl gait    LABS:  Results for orders placed or performed in visit on 10/29/14   Urinalysis Complete, URN   Result Value Ref Range    Color, URN Pale     Clarity, URN Clear     Specific Gravity, URN 1.008 1.002 - 1.027 g/mL    pH, URN 6.0 5.0 - 8.0    Protein (Alb Semiquant), URN Negative NRN mg/dL    Glucose Qual, URN Negative NRN mg/dL    Ketones, URN Negative NRN mg/dL    Bilirubin (Qual), URN Negative NRN    Occult Blood, URN Negative NRN    Nitrite, URN Negative NRN    Leukocyte Esterase, URN Trace (A) NRN    Urobilinogen, URN [id number] URONML [Ehrlich'U]    Comments for Macroscopic, URN       Ascorbic acid has been detected in this urine specimen, which can affect glucose, bilirubin, blood and nitrite results in this sample.    WBC, URN 0-5(NEG) Z5NEG /[HPF]    RBC, URN 0-2(NEG) Z2NEG /[HPF]    Bacteria, URN Not Seen NOSEEN    Epith Cells_Squamous, URN >5(PRESENT) (A) LT6 /[LPF]    Epith Cells_Renal/Trans,URN <3(NEG) [id number] /[HPF]    Comments For Microscopic, URN None NONE    Mucus, URN Present (A) NOSEEN /[LPF]       RADIOLOGY:  Please refer to HPI    ASSESSMENT/PLAN:  75 year old  female history of uterine prolapse is post hysterectomy, urge incontinence, and chronic constipation with recent change in her bowel habits to now include alternating diarrhea and constipation with nausea.  Her history of prolapse and other pelvic anatomical issues, suspect that she may have a component of pelvic floor outlet obstruction to explain her constipation which may be leading to overflow diarrhea/incontinence.  Recommend MR defecography to characterize her pelvic anatomy present (i.e. rule out rectocele, peritoneocele, enterocele, intussusception).  Although no evidence of pelvic floor dyssynergia on our exam today, rectal stool ball.  Will also recommend referral for pelvic floor PT for performance of biofeedback while  she is there for treatment of her urge incontinence.    We would like to repeat her colonoscopy as the last exam was done prior to this change in her bowel habits and given history of colon polyps.  Will also plan for an EGD to evaluate her chronic nausea, not responding to PPIs.  Per patient, prior work-up for celiac disease was negative.    If her workup is suggestive of functional dyspepsia or IBS, may consider referral to GI nutritionist for dietary counseling including trial of low fodmap diet to identify food triggers.  At this time would not recommend empiric treatment for IBS or even treatment for constipation until we fully evaluate her pelvic floor function as giving laxatives may aggravate her bloating/nausea.    Recommendations  - referral to MRI defecography  - agree with Pelvic floor PT/biofeedback, place referral to address pelvic floor component/fecal retention  - EGD/colonoscopy with moderate sedation, indicated for change in bowel habits, history of colon polyps and refractory nausea despite PPI trial.  If evidence of gastritis, biopsy to r/o H pylori.    RTC in 12 weeks or sooner as needed    I saw and evaluated the patient and agree with Dr.Higa's note.      Halford Chessman, MD  Acting Assistant Professor  Division of Gastroenterology  [facility]  Pager [phone]

## 2015-05-05 ENCOUNTER — Telehealth (HOSPITAL_BASED_OUTPATIENT_CLINIC_OR_DEPARTMENT_OTHER): Payer: Self-pay

## 2015-05-12 ENCOUNTER — Encounter (HOSPITAL_BASED_OUTPATIENT_CLINIC_OR_DEPARTMENT_OTHER): Payer: Medicare PPO | Admitting: Rehabilitative and Restorative Service Providers"

## 2015-05-13 ENCOUNTER — Encounter (HOSPITAL_BASED_OUTPATIENT_CLINIC_OR_DEPARTMENT_OTHER): Payer: Medicare PPO | Admitting: Rehabilitative and Restorative Service Providers"

## 2015-05-13 ENCOUNTER — Telehealth (HOSPITAL_BASED_OUTPATIENT_CLINIC_OR_DEPARTMENT_OTHER): Payer: Self-pay

## 2015-05-13 DIAGNOSIS — K5904 Chronic idiopathic constipation: Secondary | ICD-10-CM

## 2015-05-13 DIAGNOSIS — R110 Nausea: Secondary | ICD-10-CM

## 2015-05-13 MED ORDER — MOVIPREP 100 G OR SOLR
ORAL | Status: DC
Start: 2015-05-13 — End: 2015-08-06

## 2015-05-13 MED ORDER — PEG 3350-KCL-NABCB-NACL-NASULF 236 G OR SOLR
ORAL | Status: DC
Start: 2015-05-13 — End: 2015-08-06

## 2015-05-13 NOTE — Telephone Encounter (Signed)
Sharepoint Assessment Questions    Height: 5' 0.5", Weight: 118 lbs, BMI: 22.7    GENERAL QUESTIONS:  Does the patient have sufficient understanding of English? Yes    Is the patient able to provide consent? Yes     Patient Assessment    Do you have an allergy to Fentanyl or Versed? No    Have you ever had any problems in the past with sedation (Different from General Anesthesia)? No    Do you use narcotics on a daily basis? No    Does you use oxygen at Home?  No    Are you taking any Anti-coagulant or Anti-Platelet Medications? No    Do you have any bleeding or coagulation disorders? No    Are you Diabetic?  No    Are you on dialysis? No    Do you have diagnosed sleep apnea? No    Do you currently have a diagnosis of Congestive Heart Failure or CHF that you are being treated for? No    Do you have a Pacemaker or Defibrillator? No    Are you a difficult IV start? No    Is the patient a female & younger than 75yrs old? No    Do you have any mobility issues that make it difficult to get onto a stretcher? No    Is this patient scheduled for a colonoscopy? Yes  If yes, do you have less than 2 bowel movements a week? No    Is this an Upper Endoscopy SEDATION Case (EGD, EUS, PEG)? Yes  If yes, are you able to open your mouth fully without any difficulty? Yes    PROCEDURE        Procedure MD: Epimenio Foot. Justice Rocher, MD  Procedure Type: EGD and Colonoscopy w/ Sedation  Procedure Date:  05-18-15 Time: 2:00 PM    Procedure Check-In Time: 1:30 PM    Patient Teaching  Who received the teaching - Patient? Yes    Was the topic of stopping iron supplements taught? Yes    Was the topic of blood thinners taught?  Yes    Was the topic of diet taught? Yes    Were instructions for taking current medications taught? Yes    Was our transportation policy taught? Yes    What is the name of their driver? Pandora (daughter)    How were the procedure preparation instruction materials delivered?  Verbal: Yes  On Paper: Yes  eCare message:  No  Email: Yes    Does this procedure require bowel prep? Yes  If yes, were instructions for the ordered laxative taught? Yes  If yes, which RX was prescribed? Golytely and Moviprep - pt prefers MoviPrep if covered    General Notes:  E-Mailed ([email or web address]) + Mailed EGD & Colon w/ GoLytely + MoviPrep (pt will choose 1) prep instructions.

## 2015-05-14 ENCOUNTER — Other Ambulatory Visit: Payer: Self-pay | Admitting: Internal Medicine

## 2015-05-18 ENCOUNTER — Encounter (HOSPITAL_BASED_OUTPATIENT_CLINIC_OR_DEPARTMENT_OTHER): Payer: Medicare PPO | Admitting: Gastroenterology

## 2015-05-19 ENCOUNTER — Ambulatory Visit: Payer: Medicare PPO | Attending: Urology | Admitting: Rehabilitative and Restorative Service Providers"

## 2015-05-19 DIAGNOSIS — R531 Weakness: Secondary | ICD-10-CM | POA: Insufficient documentation

## 2015-05-19 DIAGNOSIS — N3941 Urge incontinence: Secondary | ICD-10-CM | POA: Insufficient documentation

## 2015-05-19 DIAGNOSIS — N393 Stress incontinence (female) (male): Secondary | ICD-10-CM | POA: Insufficient documentation

## 2015-05-27 ENCOUNTER — Ambulatory Visit: Payer: Medicare PPO | Attending: Internal Medicine

## 2015-05-27 ENCOUNTER — Ambulatory Visit (HOSPITAL_BASED_OUTPATIENT_CLINIC_OR_DEPARTMENT_OTHER): Payer: Medicare PPO

## 2015-05-27 ENCOUNTER — Other Ambulatory Visit (HOSPITAL_BASED_OUTPATIENT_CLINIC_OR_DEPARTMENT_OTHER): Payer: Self-pay | Admitting: Diagnostic Radiology

## 2015-05-27 ENCOUNTER — Other Ambulatory Visit: Payer: Self-pay | Admitting: Internal Medicine

## 2015-05-27 DIAGNOSIS — N63 Unspecified lump in breast: Secondary | ICD-10-CM

## 2015-05-27 DIAGNOSIS — N6082 Other benign mammary dysplasias of left breast: Secondary | ICD-10-CM | POA: Insufficient documentation

## 2015-05-27 DIAGNOSIS — N6012 Diffuse cystic mastopathy of left breast: Secondary | ICD-10-CM | POA: Insufficient documentation

## 2015-05-27 HISTORY — PX: BREAST BIOPSY: SHX5072

## 2015-05-28 ENCOUNTER — Encounter (HOSPITAL_BASED_OUTPATIENT_CLINIC_OR_DEPARTMENT_OTHER): Payer: Self-pay | Admitting: Rehabilitative and Restorative Service Providers"

## 2015-05-29 ENCOUNTER — Other Ambulatory Visit (INDEPENDENT_AMBULATORY_CARE_PROVIDER_SITE_OTHER): Payer: Self-pay | Admitting: Family Practice

## 2015-05-29 DIAGNOSIS — R059 Cough, unspecified: Secondary | ICD-10-CM

## 2015-05-29 LAB — PATHOLOGY, SURGICAL

## 2015-05-29 MED ORDER — ACETAMINOPHEN-CODEINE 120-12 MG/5ML OR SOLN
ORAL | Status: DC
Start: 2015-05-29 — End: 2016-07-19

## 2015-05-29 NOTE — Telephone Encounter (Signed)
The medication requested is listed as historical in the med list.  Med: Acetaminophen-Codine syrup

## 2015-05-29 NOTE — Telephone Encounter (Signed)
Signed, but I am remote.     Staff: please call in. Can have covering sign hard copy.     Thank you.

## 2015-05-29 NOTE — Telephone Encounter (Signed)
Narcotic needs either OV or PCP approval. Routing request.

## 2015-05-29 NOTE — Telephone Encounter (Signed)
CALLED TO GIVE VERBAL AUTHORIZATION FOR REFILL PER BELOW REQUEST. SHREDDING ORIGINAL PRINTED PRESCRIPTION. CLOSING TE

## 2015-06-03 ENCOUNTER — Encounter (HOSPITAL_BASED_OUTPATIENT_CLINIC_OR_DEPARTMENT_OTHER): Payer: Medicare PPO | Admitting: Gastroenterology

## 2015-06-04 ENCOUNTER — Ambulatory Visit: Payer: Medicare PPO | Attending: Urology | Admitting: Rehabilitative and Restorative Service Providers"

## 2015-06-04 DIAGNOSIS — N3941 Urge incontinence: Secondary | ICD-10-CM | POA: Insufficient documentation

## 2015-06-04 DIAGNOSIS — N393 Stress incontinence (female) (male): Secondary | ICD-10-CM | POA: Insufficient documentation

## 2015-06-04 DIAGNOSIS — R531 Weakness: Secondary | ICD-10-CM | POA: Insufficient documentation

## 2015-06-10 ENCOUNTER — Ambulatory Visit (HOSPITAL_BASED_OUTPATIENT_CLINIC_OR_DEPARTMENT_OTHER): Payer: Medicare PPO | Admitting: Rehabilitative and Restorative Service Providers"

## 2015-06-12 ENCOUNTER — Other Ambulatory Visit (HOSPITAL_BASED_OUTPATIENT_CLINIC_OR_DEPARTMENT_OTHER): Payer: Medicare PPO

## 2015-06-15 ENCOUNTER — Other Ambulatory Visit (HOSPITAL_BASED_OUTPATIENT_CLINIC_OR_DEPARTMENT_OTHER): Payer: Self-pay | Admitting: Gastroenterology

## 2015-06-15 ENCOUNTER — Ambulatory Visit (HOSPITAL_BASED_OUTPATIENT_CLINIC_OR_DEPARTMENT_OTHER): Payer: Medicare PPO | Admitting: Gastroenterology

## 2015-06-15 ENCOUNTER — Ambulatory Visit: Payer: Medicare PPO | Attending: Gastroenterology | Admitting: Gastroenterology

## 2015-06-15 DIAGNOSIS — R197 Diarrhea, unspecified: Secondary | ICD-10-CM

## 2015-06-15 DIAGNOSIS — Z8719 Personal history of other diseases of the digestive system: Secondary | ICD-10-CM | POA: Insufficient documentation

## 2015-06-15 DIAGNOSIS — K2950 Unspecified chronic gastritis without bleeding: Secondary | ICD-10-CM | POA: Insufficient documentation

## 2015-06-15 DIAGNOSIS — K5730 Diverticulosis of large intestine without perforation or abscess without bleeding: Secondary | ICD-10-CM | POA: Insufficient documentation

## 2015-06-18 ENCOUNTER — Ambulatory Visit (HOSPITAL_BASED_OUTPATIENT_CLINIC_OR_DEPARTMENT_OTHER): Payer: Medicare PPO | Admitting: Rehabilitative and Restorative Service Providers"

## 2015-06-22 ENCOUNTER — Encounter (HOSPITAL_BASED_OUTPATIENT_CLINIC_OR_DEPARTMENT_OTHER): Payer: Self-pay | Admitting: Gastroenterology

## 2015-06-22 LAB — PATHOLOGY, SURGICAL

## 2015-06-24 ENCOUNTER — Encounter (HOSPITAL_BASED_OUTPATIENT_CLINIC_OR_DEPARTMENT_OTHER): Payer: Medicare PPO | Admitting: Rehabilitative and Restorative Service Providers"

## 2015-06-29 ENCOUNTER — Encounter (HOSPITAL_BASED_OUTPATIENT_CLINIC_OR_DEPARTMENT_OTHER): Payer: Medicare PPO | Admitting: Rehabilitative and Restorative Service Providers"

## 2015-07-01 ENCOUNTER — Encounter (HOSPITAL_BASED_OUTPATIENT_CLINIC_OR_DEPARTMENT_OTHER): Payer: Medicare PPO | Admitting: Rehabilitative and Restorative Service Providers"

## 2015-07-13 ENCOUNTER — Other Ambulatory Visit (HOSPITAL_BASED_OUTPATIENT_CLINIC_OR_DEPARTMENT_OTHER): Payer: Medicare PPO

## 2015-07-20 ENCOUNTER — Ambulatory Visit: Payer: Medicare PPO | Attending: Gastroenterology

## 2015-07-20 DIAGNOSIS — N8110 Cystocele, unspecified: Secondary | ICD-10-CM

## 2015-07-20 DIAGNOSIS — N8184 Pelvic muscle wasting: Secondary | ICD-10-CM

## 2015-07-20 DIAGNOSIS — K5904 Chronic idiopathic constipation: Secondary | ICD-10-CM | POA: Insufficient documentation

## 2015-08-05 ENCOUNTER — Encounter (INDEPENDENT_AMBULATORY_CARE_PROVIDER_SITE_OTHER): Payer: Medicare PPO | Admitting: Internal Medicine

## 2015-08-06 ENCOUNTER — Encounter (INDEPENDENT_AMBULATORY_CARE_PROVIDER_SITE_OTHER): Payer: Self-pay | Admitting: Internal Medicine

## 2015-08-06 ENCOUNTER — Ambulatory Visit (INDEPENDENT_AMBULATORY_CARE_PROVIDER_SITE_OTHER): Payer: Medicare PPO | Admitting: Internal Medicine

## 2015-08-06 VITALS — BP 151/70 | HR 58 | Temp 98.1°F | Resp 16 | Wt 121.8 lb

## 2015-08-06 DIAGNOSIS — R5383 Other fatigue: Secondary | ICD-10-CM

## 2015-08-06 DIAGNOSIS — Z72820 Sleep deprivation: Secondary | ICD-10-CM

## 2015-08-06 DIAGNOSIS — R058 Other specified cough: Secondary | ICD-10-CM

## 2015-08-06 DIAGNOSIS — J988 Other specified respiratory disorders: Secondary | ICD-10-CM

## 2015-08-06 DIAGNOSIS — Z13820 Encounter for screening for osteoporosis: Secondary | ICD-10-CM

## 2015-08-06 DIAGNOSIS — J00 Acute nasopharyngitis [common cold]: Secondary | ICD-10-CM

## 2015-08-06 DIAGNOSIS — IMO0001 Reserved for inherently not codable concepts without codable children: Secondary | ICD-10-CM

## 2015-08-06 DIAGNOSIS — F5101 Primary insomnia: Secondary | ICD-10-CM

## 2015-08-06 DIAGNOSIS — R05 Cough: Secondary | ICD-10-CM

## 2015-08-06 LAB — LIPID PANEL
Cholesterol (LDL): 98 mg/dL (ref <130)
Cholesterol/HDL Ratio: 4.9
HDL Cholesterol: 38 mg/dL — ABNORMAL LOW (ref >39)
Non-HDL Cholesterol: 147 mg/dL (ref 0–159)
Total Cholesterol: 185 mg/dL (ref <200)
Triglyceride: 243 mg/dL — ABNORMAL HIGH (ref <150)

## 2015-08-06 LAB — CBC (HEMOGRAM)
Hematocrit: 44 % (ref 36–45)
Hemoglobin: 14.3 g/dL (ref 11.5–15.5)
MCH: 28.7 pg (ref 27.3–33.6)
MCHC: 32.8 g/dL (ref 32.2–36.5)
MCV: 88 fL (ref 81–98)
Platelet Count: 192 10*3/uL (ref 150–400)
RBC: 4.98 10*6/uL (ref 3.80–5.00)
RDW-CV: 13.4 % (ref 11.6–14.4)
WBC: 3.98 10*3/uL — ABNORMAL LOW (ref 4.3–10.0)

## 2015-08-06 LAB — THYROID STIMULATING HORMONE: Thyroid Stimulating Hormone: 0.818 u[IU]/mL (ref 0.400–5.000)

## 2015-08-06 MED ORDER — AZITHROMYCIN 250 MG OR TABS
ORAL_TABLET | ORAL | Status: DC
Start: 2015-08-06 — End: 2015-09-03

## 2015-08-06 NOTE — Progress Notes (Signed)
Robin Hart is a 75 year old female who presents today for several concerns.       Assessment and Plan:    1. Recurrent respiratory infection, 2. Productive cough, wheeze  Differential includes recurrent viral respiratory infections with no underlying malicious etiology vs development or unmasking of some predisposing factor. Assess more broadly with PFTs for asthma/COPD, CXR as basic screen for marked fibrosis, mass, hyperinflation, suggestion of MAI or bronchiectasis etc.   She requests azithro now to resolve current episode.   - CBC (HEMOGRAM)  - Azithromycin 250 MG Oral Tab; Take 2 tablets today, then take 1 tablet every day until gone.  Dispense: 6 tablet; Refill: 0  - XR CHEST 2 VW  - REFERRAL TO PFT DIAGNOSTIC LAB    3. Primary insomnia 4. Problems related to lack of adequate sleep  This is a long standing issue. She feels that it is reasonable to pull the trigger on a sleep study at this point.   - [place]    5. Fatigue, unspecified type, 6. Cold  - THYROID STIMULATING HORMONE  - LIPID PANEL  - CBC (HEMOGRAM)  - XR CHEST 2 VW    7. Screening for osteoporosis  - DEXA, AXIAL SKELETON    ---    Follow up:3 mos or PRN    Subjective:     History of Present Illness:     1) recurrent respiratory infections and bronchitis.  This is apparently her eighth such episode in the last 16 months. Thet last for weeks. This is a marked difference for her from previous years.  Symptoms have varied somewhat, but episodes are marked by an absence of fever and a presence of coughing and wheezing.  The coughing is sometimes productive.  Episodes have resolved prompty when she's used azithromycin.  She also finds an albuterol inhaler useful during these episodes. She has been evaluated for all of these episodes in the [facility] system, but not by me.  She has not had a chest x-ray performed.  I don't see documented spirometry.  She is frequently around her toddler aged grandchildren but not always.  In  particular this episode started without any apparent sick exposure.  No history of asthma or allergies.    2) hypertension.  She's been resistant to medications in general, although one point in time she took a thiazide and then maybe thiazide and Ace together.  With attention to diet and exercise and with a sense that blood pressure targets could be somewhat relaxed, her previous PCP and she agreed to stop medications.  Now she notes that her blood pressures are consistently in the 140s to 150s and wonders about restarting medication.  Given her issues with bladder we would prefer an Acei to a thiazide.    3) she still exhausted.  during the day.  She has trouble staying asleep but when she does get 8 or 10 hours of what she produces uninterrupted sleep, she wakes up feeling unrefreshed and just as tired as before.  She is also cold all the time. She has never done a sleep study.     4) history of osteopenia/borderline osteoporosis. Prev plan to observe (no bisphos to date). Due for repeat.     5) she also completed the workup recommended by Dr. Domenic Polite including an MRI defecography, which was an interesting experience. Neither of [place] are quite sure how to interpret. She will see Dr Domenic Polite back in July.  ROS: as per HPI      Objective:     PE:    Filed Vitals:    08/06/15 1043   BP: 151/70   Pulse: 58   Temp: 98.1 F (36.7 C)   TempSrc: Oral   Resp: 16   Weight: 121 lb 12.8 oz (55.248 kg)   SpO2: 95%     Body mass index is 23.03 kg/(m^2).    Physical Exam   Constitutional: She is oriented to person, place, and time. She appears well-developed and well-nourished.   HENT:   Right Ear: External ear normal.   Left Ear: External ear normal.   Mouth/Throat: Oropharynx is clear and moist. No oropharyngeal exudate.   Eyes: Conjunctivae are normal.   Neck: Neck supple. No thyromegaly present.   Cardiovascular: Normal rate and regular rhythm.    Pulmonary/Chest: Effort normal.   No wheezes or rales  Prolonged expiratory phase    Lymphadenopathy:     She has no cervical adenopathy.   Neurological: She is alert and oriented to person, place, and time.   Psychiatric: She has a normal mood and affect. Her behavior is normal. Judgment and thought content normal.

## 2015-08-06 NOTE — Patient Instructions (Addendum)
1. GOAL BLOOD PRESSUE IN 3 MONTHS IS AVG [id number] systolic  IF not at goal, will introduce low dose angiotension converting enzyme inhibitor     2. PFTs when feeling back to baseline    It was a pleasure to see you in clinic today. Your Medical Assistant was: Psychiatrist, [place].          You can schedule an appointment to see [place] by calling  [phone] or via eCare.     If labs were ordered today the results are expected to be available via eCare 5 days later. Otherwise, result letters are mailed 7-10 days after your tests are completed. If your physician needs to change your care based on your results, you will receive a phone call to notify you. If you haven't heard from him/her and it has been more than 10 days please give [place] a call.     You may receive a survey in the mail asking how your experience has been with [facility].  Your input and opinions are very important to [place].  I hope you are able to take the time to complete this survey.    Thank you for choosing [facility].

## 2015-08-06 NOTE — Progress Notes (Signed)
Reason for Visit: Patient is here today because she would like to discuss blood pressure and cholesterol.  Additionally, Patient complains of cough/cold symptoms since Saturday.  Patient states that this is her 8th URI in the last 16 months.    Refills? NO  Referral? NO  Letter or Form? NO  Lab Results? NO    HEALTH MAINTENANCE:  Has the patient has this done since their last visit?  Cervical screening/PAP: N/A  Mammo: N/A  Colon Screen: Patient states this was completed already  Diabetic Eye Exam (If applicable): N/A      Have you seen a specialist since your last visit: No    Vaccines Due? Yes, PCV-13, Zoster and TDAP- patient insists she has had these vaccines previously    PHQ2 done in the last year (365 days)? yes    Does patient have eCare?  yes    HM Due:   Health Maintenance   Topic Date Due   . Tetanus Vaccine  03[birth year]/1953   . Cholesterol Test  03/05/1986   . Colon Cancer Screen w/ FOBT/FIT  03/06/1991   . Zoster Vaccine  03/05/2001   . DEXA Scan  03/05/2006   . Pneumococcal Vaccine (1 of 2 - PCV13) 03/05/2006   . Mammogram  05/26/2017   . Influenza Vaccine  Completed       PCP Verified?  Yes, Dr. Rachel Moulds

## 2015-08-10 ENCOUNTER — Encounter (INDEPENDENT_AMBULATORY_CARE_PROVIDER_SITE_OTHER): Payer: Self-pay | Admitting: Internal Medicine

## 2015-08-12 NOTE — Telephone Encounter (Signed)
Please see ecare response

## 2015-08-17 ENCOUNTER — Telehealth (HOSPITAL_BASED_OUTPATIENT_CLINIC_OR_DEPARTMENT_OTHER): Payer: Self-pay | Admitting: Gastroenterology

## 2015-08-17 DIAGNOSIS — R194 Change in bowel habit: Secondary | ICD-10-CM

## 2015-08-17 DIAGNOSIS — K5902 Outlet dysfunction constipation: Secondary | ICD-10-CM

## 2015-08-17 DIAGNOSIS — R110 Nausea: Secondary | ICD-10-CM

## 2015-08-17 MED ORDER — RANITIDINE HCL 150 MG OR TABS
150.0000 mg | ORAL_TABLET | Freq: Every day | ORAL | Status: DC | PRN
Start: 2015-08-17 — End: 2016-04-25

## 2015-08-17 NOTE — Telephone Encounter (Signed)
Called patient regarding results of MR defecography.  Will place referral to PT once patient sends me fax number.  Trial of tap water enemas. Follow-up in 3 months.    Also c/o nausea.  Trial of H2 blocker.    Halford Chessman, MD  Acting Assistant Professor  Division of Gastroenterology  [facility]

## 2015-08-18 ENCOUNTER — Ambulatory Visit: Payer: Medicare PPO | Attending: Internal Medicine

## 2015-08-18 DIAGNOSIS — R05 Cough: Secondary | ICD-10-CM

## 2015-08-19 ENCOUNTER — Encounter (INDEPENDENT_AMBULATORY_CARE_PROVIDER_SITE_OTHER): Payer: Self-pay | Admitting: Family Medicine

## 2015-08-19 ENCOUNTER — Ambulatory Visit (INDEPENDENT_AMBULATORY_CARE_PROVIDER_SITE_OTHER): Payer: Self-pay | Admitting: Internal Medicine

## 2015-08-19 ENCOUNTER — Ambulatory Visit (INDEPENDENT_AMBULATORY_CARE_PROVIDER_SITE_OTHER): Payer: Medicare PPO | Admitting: Family Medicine

## 2015-08-19 VITALS — BP 175/79 | HR 54 | Temp 97.9°F | Resp 16 | Wt 121.9 lb

## 2015-08-19 DIAGNOSIS — I10 Essential (primary) hypertension: Secondary | ICD-10-CM

## 2015-08-19 DIAGNOSIS — N760 Acute vaginitis: Secondary | ICD-10-CM

## 2015-08-19 DIAGNOSIS — B9689 Other specified bacterial agents as the cause of diseases classified elsewhere: Secondary | ICD-10-CM

## 2015-08-19 LAB — PR WET PREP/KOH/TEST, ONSITE
Other, Wet Prep: NEGATIVE
RBC, Wet Prep: NEGATIVE
Trich: NEGATIVE

## 2015-08-19 MED ORDER — METRONIDAZOLE 0.75 % VA GEL
1.0000 | Freq: Every evening | VAGINAL | Status: AC
Start: 2015-08-19 — End: 2015-08-24

## 2015-08-19 NOTE — Patient Instructions (Signed)
Bacterial Vaginosis    You have a vaginal infection called bacterial vaginosis (BV). Both good and bad bacteria are present in a healthy vagina. BV occurs when these bacteria get out of balance. The number of bad bacteria increase. And the number of good bacteria decrease.  BV may or may not cause symptoms. If symptoms do occur, they can include:  · Thin, gray, milky-white, or sometimes green discharge  · Unpleasant odor or “fishy” smell  · Itching, burning, or pain in or around the vagina  It is not known what causes BV, but certain factors can make the problem more likely. This can include:  · Douching  · Having sex with a new partner  · Having sex with more than one partner  BV will sometimes go away on its own. But treatment is usually recommended. This is because untreated BV can increase the risk of more serious health problems such as:  · Pelvic inflammatory disease (PID)  · Preterm delivery (giving birth to a baby early if you’re pregnant)  · HIV and certain other sexually transmitted diseases (STDs)  · Infection after surgery on the reproductive organs  Home care  General care  · BV is most often treated with medicines called antibiotics. These may be given as pills or as a vaginal cream. If antibiotics are prescribed, be sure to use them exactly as directed. Also, be sure to complete all of the medicine, even if your symptoms go away.  · Avoid douching or having sex during treatment.  · If you have sex with a female partner, ask your healthcare provider if she should also be treated.  Prevention  · Limit or avoid douching.  · Avoid having sex. If you do have sex, then take steps to lower your risk:  ¨ Use condoms when having sex.  ¨ Limit the number of partners you have sex with.  Follow-up care  Follow up with your healthcare provider, or as advised.  When to seek medical advice  Call your healthcare provider right away if:  · You have a fever of 100.4ºF (38ºC) or higher, or as directed by your  provider.  · Your symptoms worsen, or they don’t go away within a few days of starting treatment.  · You have new pain in the lower belly or pelvic region.  · You have side effects that bother you or a reaction to the pills or cream you’re prescribed.  · You or any partners you have sex with have new symptoms, such as a rash, joint pain, or sores.  Date Last Reviewed: 10/31/2013  © 2000-2017 [employer] [address], [place] [address]. All rights reserved. This information is not intended as a substitute for professional medical care. Always follow your healthcare professional's instructions.

## 2015-08-19 NOTE — Telephone Encounter (Signed)
Patient is calling to report vaginal itching and different vaginal odor.  For the past week.  No vaginal discharge  No pain or burning with urination    Patient is using Triamcinolone cream which she has [place] from [place]  Patient states this topical really isn't helping with the itching and patient is concerned about the new vaginal odor.    Will go to [facility] today for evaluation.  Call back to Nurse line for other questions or concerns.

## 2015-08-19 NOTE — Telephone Encounter (Signed)
Protocol: VAGINAL SYMPTOMS-ADULT-AH  Negative: Abdominal pain is main symptom  Negative: Itching or rash of external female genital area (vulva)  Negative: [1] SEVERE pain AND [2] not improved 2 hours after pain medicine  Negative: [1] Genital area looks infected (e.g., draining sore, spreading redness) AND [2] fever  Negative: [1] Something is hanging out of the vagina AND [2] cannot easily be pushed back inside  Affirmative: MODERATE-SEVERE itching (i.e., interferes with school, work, or sleep)  Disposition of See Physician Within 24 Hours suggested.

## 2015-08-19 NOTE — Telephone Encounter (Signed)
(  TEXTING IS AN OPTION FOR [facility] ONLY)  Is this a [place] clinic? Yes. Patient declined the option to receive mobile text messages.      RETURN CALL: Detailed message on voicemail only      SUBJECT:  General Message     REASON FOR REQUEST: Patient wanting to speak with nurse about possible vaginitis    MESSAGE: Patient would like to speak with a nurse about possible vaginitis before scheduling an appointment. She will be in class today from 1-3PM so please call her back before or after that. Thanks.

## 2015-08-19 NOTE — Progress Notes (Signed)
Robin Hart is a 75 year old here for the following:    Complains of few weeks of vaginal itching and odor.  Does have history of yeast infections but those usually cause burning  Denies vaginal pain or vaginal bleeding  Does use soap to wash vaginal area   Denies changes in diet, soaps, or detergents    Review of systems:  Constitutional: negative   GI: negative   Skin: Denies rash  GU: Denies dysuria or hematuria    Any pertinent problem list, past medical history, surgical history, family and social history as well as medication list reviewed with patient      Filed Vitals:    08/19/15 1257   BP: 175/79   Pulse: 54   Temp: 97.9 F (36.6 C)   TempSrc: Oral   Resp: 16   Weight: 121 lb 14.6 oz (55.3 kg)   SpO2: 97%     PHYSICAL EXAM:  General: healthy, alert, no distress  Skin: no rash   Back:  No CVA tenderness.  Abd: soft, non-tender.  GU: mildly erythematous and inflamed external genitalia, mild fishy odor, moderate thin gray discharge in vaginal vault    A/P:    (N76.0,  B96.89) Bacterial vaginosis  Plan: MetroNIDAZOLE 0.75 % Vaginal Gel  History, physical exam, and wet prep consistent with bacterial vaginosis.  Start metronidazole: risks and side effects of medication reviewed including avoid alcohol while taking medication.   Discussed prevention including no soap in the vaginal area as it can disrupt healthy bacteria. Advised to consider probiotic intake for prevention as well. Avoid harsh laundry soaps and detergents.  Recheck precautions and follow-up reviewed.    (I10) Essential hypertension  Plan: not well controlled- close follow up

## 2015-08-20 NOTE — Addendum Note (Signed)
Addended by: Ward Givens on: 08/20/2015 02:50 PM     Modules accepted: Orders

## 2015-08-20 NOTE — Telephone Encounter (Signed)
Routing to [facility] Front desk to assist in scheduling

## 2015-08-20 NOTE — Telephone Encounter (Signed)
Hi Dr. Domenic Polite,    Pt is already scheduled for a f/u with you on 10/12/2015. Is the 3 months f/u still needed in addition to the 10/12/15 appointment?    Thank you,    Garlon Hatchet

## 2015-08-27 ENCOUNTER — Telehealth (INDEPENDENT_AMBULATORY_CARE_PROVIDER_SITE_OTHER): Payer: Self-pay | Admitting: Internal Medicine

## 2015-08-27 DIAGNOSIS — B9689 Other specified bacterial agents as the cause of diseases classified elsewhere: Secondary | ICD-10-CM

## 2015-08-27 DIAGNOSIS — N760 Acute vaginitis: Secondary | ICD-10-CM

## 2015-08-27 MED ORDER — METRONIDAZOLE 500 MG OR TABS
500.0000 mg | ORAL_TABLET | Freq: Two times a day (BID) | ORAL | 0 refills | Status: DC
Start: 2015-08-27 — End: 2015-09-03

## 2015-08-27 NOTE — Telephone Encounter (Signed)
Pt states that she was seen at [facility] and was dx was BV     Pt has used the cream for the 8 days and has had no relief.     Pt would like a new med (pill form) sent to her pharmacy       Pt is in class from 1-3 today, it is ok to leave a detailed message once med has been sent       Message sent to [facility]

## 2015-08-27 NOTE — Telephone Encounter (Signed)
Called pt and LDM  Closing te

## 2015-08-27 NOTE — Telephone Encounter (Signed)
OK, metronidazole prescribed, twice daily for seven days.  Please also remind her that she cannot drink any alcohol during the week of treatment because it will interact with the medication.

## 2015-08-27 NOTE — Telephone Encounter (Signed)
Pt states that she was seen at [facility] and was dx was BV     Pt has used the cream for the 8 days and has had no relief.     Pt would like a new med (pill form) sent to her pharmacy       Pt is in class from 1-3 today, it is ok to leave a detailed message once med has been sent     Please advise

## 2015-09-02 ENCOUNTER — Ambulatory Visit (INDEPENDENT_AMBULATORY_CARE_PROVIDER_SITE_OTHER): Payer: Medicare PPO | Admitting: Internal Medicine

## 2015-09-02 VITALS — BP 142/62 | HR 55 | Temp 97.7°F | Resp 12 | Wt 117.0 lb

## 2015-09-02 DIAGNOSIS — G4733 Obstructive sleep apnea (adult) (pediatric): Secondary | ICD-10-CM

## 2015-09-02 MED ORDER — ZOLPIDEM TARTRATE 10 MG OR TABS
10.0000 mg | ORAL_TABLET | Freq: Every evening | ORAL | 0 refills | Status: DC | PRN
Start: 2015-09-02 — End: 2015-12-02

## 2015-09-02 NOTE — Progress Notes (Signed)
Neck Circumference: 15 Inches       Sleep medicine consult  Chief Complaint: Sleepiness    History of Present Illness: I am seeing Gustavus Bryant Gross in consultation, requested by Samella Parr, MD for evaluation of sleepiness.     Maymunah Puccia Krempasky goes to bed around 10:30PM, takes 30 minutes to fall asleep, than wakes up at 2AM-and may go back to sleep 3 hours later [place] wakes up 1-2 times a night, due to bladder.  She has no restless leg symptomatology. No hypnagogic or hypnopompic hallucination, sleep paralysis or cataplexy. She snores, at an intensity of 1-2/5, with ? witnessed pauses-lives alone.   She does wake up without a dry mouth, with a sore throat, without nasal congestion. She wakes up with a morning headache. She wakes up at least twice a night to urinate. She does not have GERD symptoms interferering with sleep. She does not have light headed symptoms when standing. She does not have cold extremities. She does not have nightmares. She does not grind her teeth. She does not sleep talk, and she does not act out her dreams.    Allida Lahr has trouble awaking in the morning, and fatigue and sleepiness are often a problem for her. Her Epworth Sleepiness Scale is 19/24. She consider her general health to be very good. Her weight is Wt 117 lbs , and was 110 pounds at age 66.      Review of Symptoms:  Neurological: none  Heart: none    Review of patient's allergies indicates:  Allergies   Allergen Reactions   . Caffeine      Other reaction(s): Nausea Only  Extreme nervousness        Current Outpatient Prescriptions   Medication Sig Dispense Refill   . ACETAMINOPHEN-CODEINE #4 OR Cough syrup     . Acetaminophen-Codeine 120-12 MG/5ML Oral Solution take 5 milliliter by mouth every 6 hours if needed for cough 118 mL 0   . Azithromycin 250 MG Oral Tab Take 2 tablets today, then take 1 tablet every day until gone. 6 tablet 0   . Latanoprost 0.005 % Ophthalmic Solution  Place 1 drop in each EYE at bedtime.  1   . MetroNIDAZOLE 500 MG Oral Tab Take 1 tablet (500 mg) by mouth 2 times a day. Take until gone. 14 tablet 0   . RaNITidine HCl 150 MG Oral Tab Take 1 tablet (150 mg) by mouth daily as needed (nausea). 30 tablet 1   . Spacer/Aero-Holding Chambers (AEROCHAMBER MV) Misc Use. 1 each 0   . Timolol Maleate 0.5 % Ophthalmic GEL FORMING SOLUTION Place 1 drop in each EYE every morning.  1   . Zolpidem Tartrate (AMBIEN) 10 MG Oral Tab Take 1 tablet (10 mg) by mouth at bedtime as needed. 1 tablet 0     No current facility-administered medications for this visit.        Family History   Problem Relation Age of Onset   . Cancer Mother      Throat cancer   . GI Mother      IBS   . Myocardial Infarction Brother    . Breast Cancer Sister    . Hypertension Sister    . Diabetes Daughter      TYPE I   . Breast Cancer Maternal Aunt        Patient Active Problem List   Diagnosis   . Hypertension   . Irritable bowel syndrome with  diarrhea   . Sacroiliac joint dysfunction   . Osteoporosis   . OAB (overactive bladder)   . Primary insomnia   . Pelvic floor dysfunction   . Chronic idiopathic constipation   . Nausea          Past Surgical History:   Procedure Laterality Date   . Basal cell carcinoma excision     . BLADDER SUSPENSION     . BREAST BIOPSY     . COLONOSCOPY     . TOTAL ABDOM HYSTERECTOMY         Past Medical History:   Diagnosis Date   . Arthritis    . Cancer ([place])     skin cell carcinoma    . Constipation    . Diverticulosis    . Glaucoma    . H. pylori infection    . Headache    . History of diarrhea    . History of irritable bowel syndrome    . Hypertension    . Lipidemia    . Osteoporosis          Social History: Sonjia Te is divorced, with children. Work history: retired Smoking history: no. EtOH history: rare. Recreational drug history: no. Caffeine history:1 soda    Physical Examination:  Lapreal Vick is pleasant, alert and orientated in no acute distress  BP  142/62   Pulse 55   Temp 97.7 F (36.5 C) (Oral)   Resp 12   Wt 117 lb (53.1 kg)   BMI 22.11 kg/m  Body mass index is 22.11 kg/m. Neck: 15  HEAD: Oral airway Malpatti class III airway    Assessment:   1-Likely sleep apnea. Pathophysiology of apnea discussed, and questions answered. Health consequences of untreated apnea discussed. Diagnostic and treatment options discussed.     Recommendations:  1-Split night sleep study, discussed vs OCST, including cost and sensitivity. She opts for PSG and follow-up t discuss before therapy/next steps.  2-Follow-up in 2 months, sooner as needed  3-Charmel Lowanna Sanchezlopez is advised not to drive while drowsy. She understands this very important point.      cc: Samella Parr, MD          Total time of approximately 25 minutes was spent face-to-face with the patient, of which more than 50% was spent [counseling] care as outlined in this note regarding use of CPAP including answering all questions and concerns about apnea and its health implications

## 2015-09-02 NOTE — Patient Instructions (Signed)
What to Expect After Your HOSPITAL Sleep Study      By now, you've received your sleeping pill for your overnight sleep study and are ready to call [phone] to schedule your study. Please call 48 hours after your visit today to call the sleep center, as they need to process your order before scheduling your sleep study appointment.    Reading Your Study: Please be aware that it will take about 10 days to read your hospital sleep study after you leave the sleep center.

## 2015-09-03 ENCOUNTER — Telehealth (INDEPENDENT_AMBULATORY_CARE_PROVIDER_SITE_OTHER): Payer: Self-pay | Admitting: Internal Medicine

## 2015-09-03 DIAGNOSIS — N760 Acute vaginitis: Secondary | ICD-10-CM

## 2015-09-03 DIAGNOSIS — B9689 Other specified bacterial agents as the cause of diseases classified elsewhere: Secondary | ICD-10-CM

## 2015-09-03 MED ORDER — METRONIDAZOLE 500 MG OR TABS
500.0000 mg | ORAL_TABLET | Freq: Two times a day (BID) | ORAL | 0 refills | Status: DC
Start: 2015-09-03 — End: 2015-09-07

## 2015-09-03 NOTE — Telephone Encounter (Signed)
Per chart review, she was prescribed topical Flagyl on May 17.    For Recurrent/persistent BV, I would offer either an alternate type of gel or an oral course of metronidazole.  They have a similar rate of success.  If a second round of treatment failed than I would like to reevaluate in person.     Please ask her if she would prefer to do another gel or a pill.  Please confirm her pharmacy and Route back to me and let her know I will sign another prescription based on that information and she could pick it up as soon as tonight.

## 2015-09-03 NOTE — Telephone Encounter (Signed)
Pt states that she has been treating her BV with different creams and pills     Pt states that she still has the sx "if before I was at a 10 now I am at a 4)       Pt states that the sx have changed mildly but still very present       Please advise

## 2015-09-03 NOTE — Telephone Encounter (Signed)
Spoke to pt, pt states that she would rather have the pills, pt informed of message below and agrees to plan.

## 2015-09-04 ENCOUNTER — Other Ambulatory Visit (INDEPENDENT_AMBULATORY_CARE_PROVIDER_SITE_OTHER): Payer: Medicare PPO

## 2015-09-04 ENCOUNTER — Telehealth (INDEPENDENT_AMBULATORY_CARE_PROVIDER_SITE_OTHER): Payer: Self-pay | Admitting: Internal Medicine

## 2015-09-04 DIAGNOSIS — R197 Diarrhea, unspecified: Secondary | ICD-10-CM

## 2015-09-04 NOTE — Telephone Encounter (Signed)
Patient is on day 2 of the second round of Metronidazole Tabs    Symptoms have slightly improved but patient c/o diarrhea x 3 days    Dizziness and nausea started today. Patient denies any alcohol use, denies dehydration. Drinking plenty of fluids. Diarrhea 3-4 episodes a day    To PCP to advise

## 2015-09-04 NOTE — Telephone Encounter (Signed)
Patient advised     Scheduled

## 2015-09-04 NOTE — Telephone Encounter (Signed)
Well, we can reassure her that metronidazole is a treatment for C. difficile so I don't think she has that.    It sounds like the diarrhea started before she began this course of metronidazole. I will place an order for stool pathogens test and she can come in and pick up a stool kit.    Probably she is feeling nauseous and under the weather from a GI bug and can continue self-care at home. Continue hydration.  Okay to use immodium or other over-the-counter for diarrhea.      Other side effects of metronidazole include ringing in the ears and metallic taste in the mouth. If she experiences those she should stop the metronidazole.

## 2015-09-04 NOTE — Telephone Encounter (Signed)
Relayed below to patient.     She states she actually does have Metallic taste. Started this AM.

## 2015-09-04 NOTE — Telephone Encounter (Signed)
OK. Lets have her stop the metronidazole.  Please get her on my schedule for next week.  Should have openings on Monday.  We will do the wet mount and if it's positive then we will decide on treatment.    The BV is certainly uncomfortable but would rather avoid unnecessary side effects.  I think we should clarify diagnosis before re-pursuing additional treatments.    If she didn't want to wait til Monday then OK to offer her an appointment over the weekend.  Or urgent care.

## 2015-09-07 ENCOUNTER — Encounter (INDEPENDENT_AMBULATORY_CARE_PROVIDER_SITE_OTHER): Payer: Self-pay | Admitting: Internal Medicine

## 2015-09-07 ENCOUNTER — Ambulatory Visit (INDEPENDENT_AMBULATORY_CARE_PROVIDER_SITE_OTHER): Payer: Medicare PPO | Admitting: Internal Medicine

## 2015-09-07 VITALS — BP 148/70 | HR 60 | Temp 97.6°F | Resp 14

## 2015-09-07 DIAGNOSIS — B9689 Other specified bacterial agents as the cause of diseases classified elsewhere: Secondary | ICD-10-CM

## 2015-09-07 DIAGNOSIS — N760 Acute vaginitis: Secondary | ICD-10-CM

## 2015-09-07 DIAGNOSIS — N898 Other specified noninflammatory disorders of vagina: Secondary | ICD-10-CM

## 2015-09-07 LAB — PR WET PREP/KOH/TEST, ONSITE
Bacteria: NEGATIVE
Odor: ABSENT
Other, Wet Prep: NEGATIVE
RBC, Wet Prep: NEGATIVE
Trich: NEGATIVE
Yeast, Wet Prep: NEGATIVE

## 2015-09-07 MED ORDER — CLINDAMYCIN PHOSPHATE (1 DOSE) 2 % VA CREA
1.0000 | TOPICAL_CREAM | Freq: Once | VAGINAL | 0 refills | Status: AC
Start: 2015-09-07 — End: 2015-09-07

## 2015-09-07 NOTE — Progress Notes (Signed)
Robin Hart is a 75 year old female who presents today for vaginal discharge.       Assessment and Plan:    1. Vaginal discharge  - WET PREP/KOH/TEST, ONSITE    2. BV (bacterial vaginosis)  She does have an elevated pH and I did see clues on wet mount today.  The differential would also include undertreated atrophy, especially given the erythema and itching that she is now also experiencing.  Since she had toxicity to metronidazole we will treat with clindamycin.  We talked a little bit about fostering healthy microbiome somehow, as well as regular use of topical estrogen.  - Clindamycin Phosphate, 1 Dose, 2 % Vaginal Cream; Place 1 applicatorful into the vagina One time for 1 dose.  Dispense: 1 Tube; Refill: 0    ---    Follow up: as needed    Subjective:     History of Present Illness:       Seen 5/17 UC. Wet prep 3+ clues. Rx'ed metro gel. Used gel twice  No improvement.   Called back and given metro tabs.   Called back again, misunderstanding as I thought she'd just done gel. Rx'ed tabs again. Then developed dairrhea and nausea, metallic taste. Dc'ed metro and made this appointment. Other GI Sx improved, unclear if it was the metro or her IBS.     On reflection, maybe the taste started after taking two doses closer together.     Odor has improved. Still with discharge and with itch.     Has not been using her topical estrogen recently.     Soc Hx: not sexually active     ROS      Objective:     PE:    Vitals:    09/07/15 1658   BP: 148/70   Pulse: 60   Resp: 14   Temp: 97.6 F (36.4 C)   TempSrc: Oral   SpO2: 98%     There is no height or weight on file to calculate BMI.    Physical Exam   Constitutional: She appears well-developed and well-nourished.   Pulmonary/Chest: Effort normal.   GU: normal external genitalia   Erythema at the introitus   Speculum exam well-tolerated   Small amount of thin homogenous discharge    PH 6.5  Wet mount: 3+ clue, scant parabasal cells noted

## 2015-09-07 NOTE — Patient Instructions (Signed)
It was a pleasure to see you in clinic today. Your Medical Assistant was: Kristine Clausnitzer, MA.          You can schedule an appointment to see us by calling  [phone] or via eCare.     If labs were ordered today the results are expected to be available via eCare 5 days later. Otherwise, result letters are mailed 7-10 days after your tests are completed. If your physician needs to change your care based on your results, you will receive a phone call to notify you. If you haven't heard from him/her and it has been more than 10 days please give us a call.     You may receive a survey in the mail asking how your experience has been with [facility].  Your input and opinions are very important to us.  I hope you are able to take the time to complete this survey.    Thank you for choosing [facility].

## 2015-09-07 NOTE — Progress Notes (Signed)
Reason for Visit: Patient complains of vaginitis and some reaction she is having due to medication given by urgent care    Refills? NO  Referral? NO  Letter or Form? NO  Lab Results? NO    HEALTH MAINTENANCE:  Has the patient has this done since their last visit?  Cervical screening/PAP: N/A  Mammo: N/A  Colon Screen: N/A  Diabetic Eye Exam (If applicable): N/A      Have you seen a specialist since your last visit: No    Vaccines Due? Yes, TDaP, PCV-13, Zoster    PHQ2 done in the last year (365 days)? yes    Does patient have eCare?  yes    HM Due:   Health Maintenance   Topic Date Due   . Tetanus Vaccine  03/05/1953   . Colon Cancer Screen w/ FOBT/FIT  03/06/1991   . Zoster Vaccine  03/05/2001   . DEXA Scan  03/05/2006   . Pneumococcal Vaccine (1 of 2 - PCV13) 03/05/2006   . Mammogram  05/26/2017   . Cholesterol Test  08/05/2020   . Influenza Vaccine  Completed       PCP Verified?  Yes, Dr. Rachel Moulds

## 2015-09-08 ENCOUNTER — Ambulatory Visit: Payer: Medicare PPO | Attending: Internal Medicine

## 2015-09-08 DIAGNOSIS — M810 Age-related osteoporosis without current pathological fracture: Secondary | ICD-10-CM | POA: Insufficient documentation

## 2015-09-08 DIAGNOSIS — M8588 Other specified disorders of bone density and structure, other site: Secondary | ICD-10-CM | POA: Insufficient documentation

## 2015-09-11 ENCOUNTER — Encounter (INDEPENDENT_AMBULATORY_CARE_PROVIDER_SITE_OTHER): Payer: Self-pay

## 2015-09-14 ENCOUNTER — Encounter (HOSPITAL_BASED_OUTPATIENT_CLINIC_OR_DEPARTMENT_OTHER): Payer: Self-pay | Admitting: Urology

## 2015-09-15 ENCOUNTER — Ambulatory Visit: Payer: Medicare PPO | Admitting: Urology

## 2015-09-29 ENCOUNTER — Telehealth (HOSPITAL_BASED_OUTPATIENT_CLINIC_OR_DEPARTMENT_OTHER): Payer: Self-pay | Admitting: Gastroenterology

## 2015-09-29 NOTE — Telephone Encounter (Signed)
(  TEXTING IS AN OPTION FOR [facility] ONLY)  Is this a [place] clinic? No      RETURN CALL: Detailed message on voicemail only      SUBJECT:  General Message     REASON FOR REQUEST: Appointment question    MESSAGE: Patient stated she had a question regarding appointment. Her question is Does she need to go through treatment with physical therapy before she sees Dr. Domenic Polite or after?

## 2015-09-29 NOTE — Telephone Encounter (Signed)
Hi Dr. Domenic Polite, can you please advise? Thank you.

## 2015-09-30 NOTE — Telephone Encounter (Signed)
Best to see me after three months of PT.

## 2015-10-01 NOTE — Telephone Encounter (Signed)
S/W pt- relayed Dr. Feliberto Harts recommendation.

## 2015-10-05 ENCOUNTER — Encounter (HOSPITAL_BASED_OUTPATIENT_CLINIC_OR_DEPARTMENT_OTHER): Payer: Medicare PPO | Admitting: Gastroenterology

## 2015-10-12 ENCOUNTER — Ambulatory Visit: Payer: Medicare PPO | Attending: Gastroenterology

## 2015-10-12 ENCOUNTER — Encounter (HOSPITAL_BASED_OUTPATIENT_CLINIC_OR_DEPARTMENT_OTHER): Payer: Medicare PPO | Admitting: Gastroenterology

## 2015-10-12 DIAGNOSIS — R531 Weakness: Secondary | ICD-10-CM | POA: Insufficient documentation

## 2015-10-12 DIAGNOSIS — N393 Stress incontinence (female) (male): Secondary | ICD-10-CM | POA: Insufficient documentation

## 2015-10-12 DIAGNOSIS — N3941 Urge incontinence: Secondary | ICD-10-CM | POA: Insufficient documentation

## 2015-10-26 ENCOUNTER — Encounter (HOSPITAL_BASED_OUTPATIENT_CLINIC_OR_DEPARTMENT_OTHER): Payer: Medicare PPO

## 2015-10-26 ENCOUNTER — Encounter (HOSPITAL_BASED_OUTPATIENT_CLINIC_OR_DEPARTMENT_OTHER): Payer: Self-pay | Admitting: Gastroenterology

## 2015-10-26 IMAGING — CT CT ABD-PELV W/ CM
3 of 5 series · 13 of 36 positions shown, 19 images · IV contrast (READICAT/WATER & [ID] OMNI 300)
Comparison: None.

CLINICAL DATA: Periumbilical pain for 4 weeks. Constipation.
History of bladder surgery from [facility]. Hysterectomy.

EXAM:
CT ABDOMEN AND PELVIS WITH CONTRAST
TECHNIQUE: Multidetector CT imaging of the abdomen and pelvis was performed
using the standard protocol following bolus administration of
intravenous contrast.
CONTRAST:  100mL OMNIPAQUE IOHEXOL 300 MG/ML  SOLN

[Series 3: abd/pelvis with · axial · 0.70mm/px · z∈[-288,+12]mm · 7 of 81 slices shown, 12 images]
[im 11/81  soft-tissue]
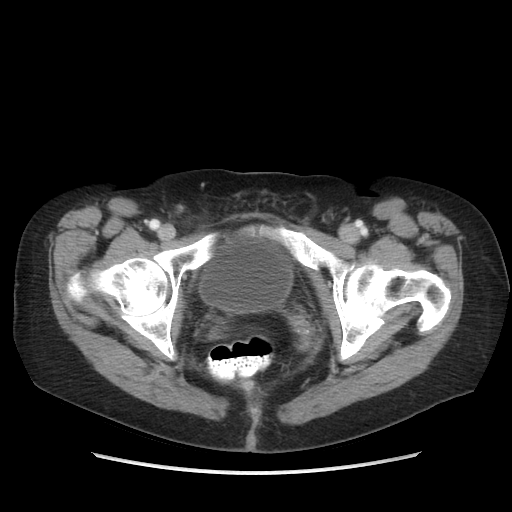
[im 11/81  bone]
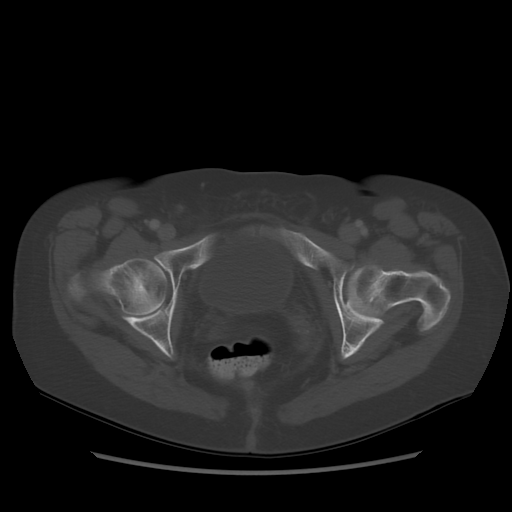
[im 21/81  soft-tissue]
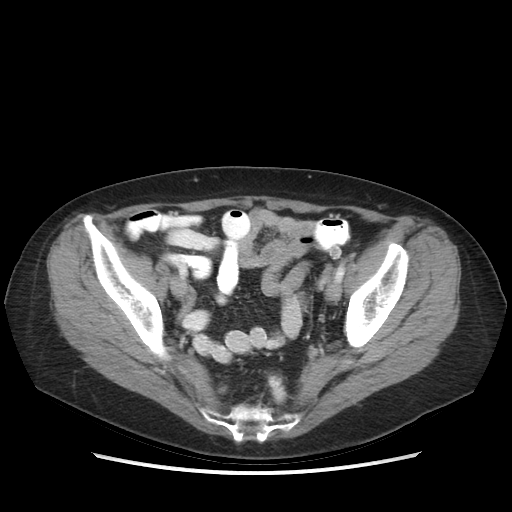
[im 31/81  soft-tissue]
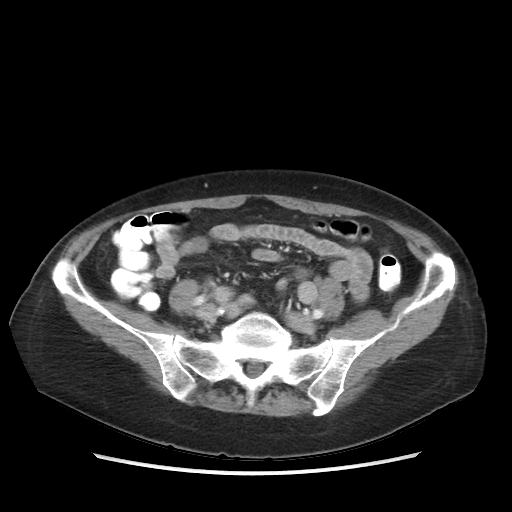
[im 41/81  soft-tissue]
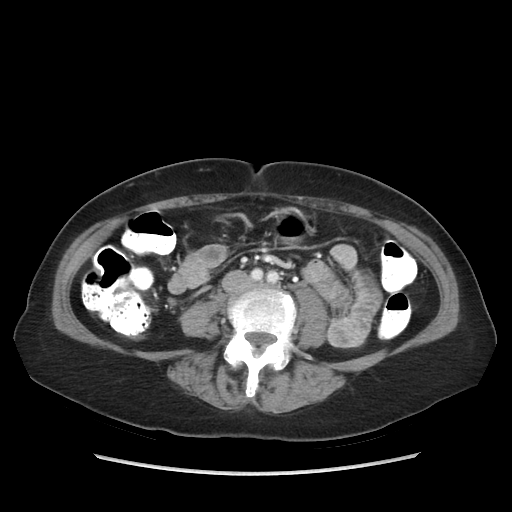
[im 41/81  lung]
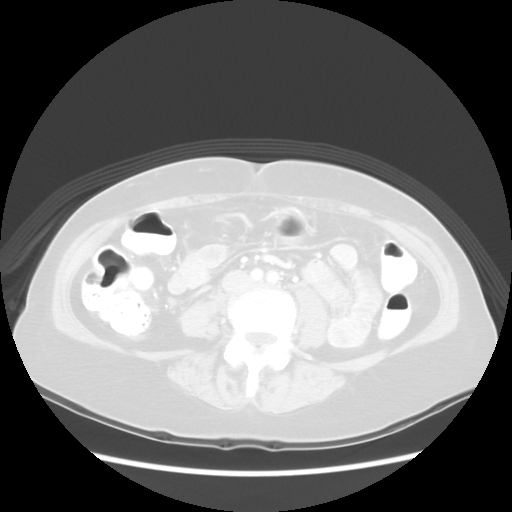
[im 51/81  soft-tissue]
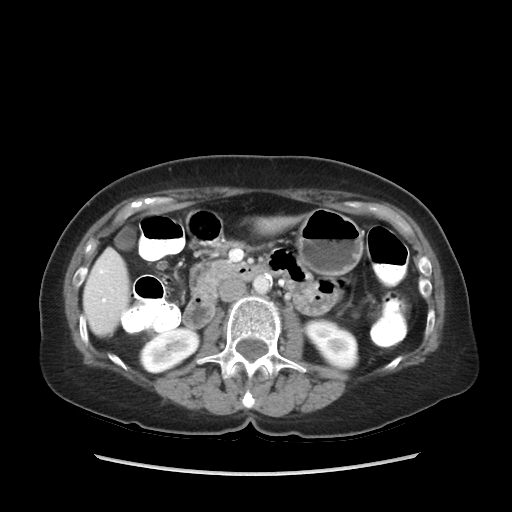
[im 51/81  lung]
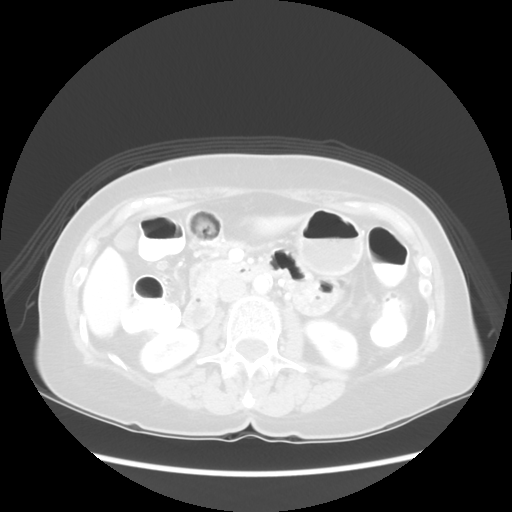
[im 61/81  soft-tissue]
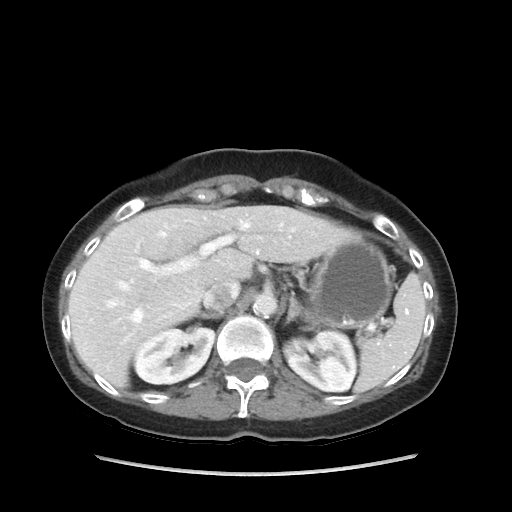
[im 61/81  lung]
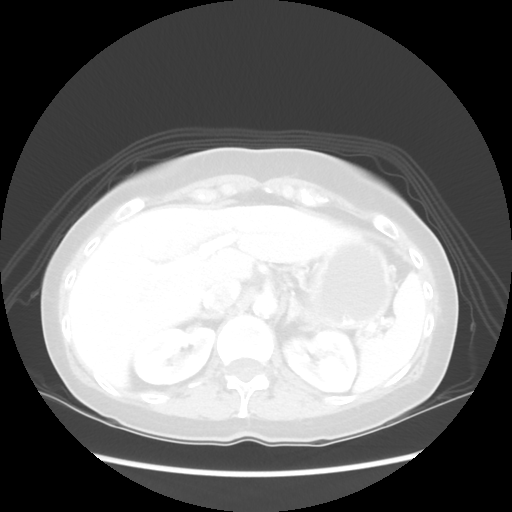
[im 71/81  soft-tissue]
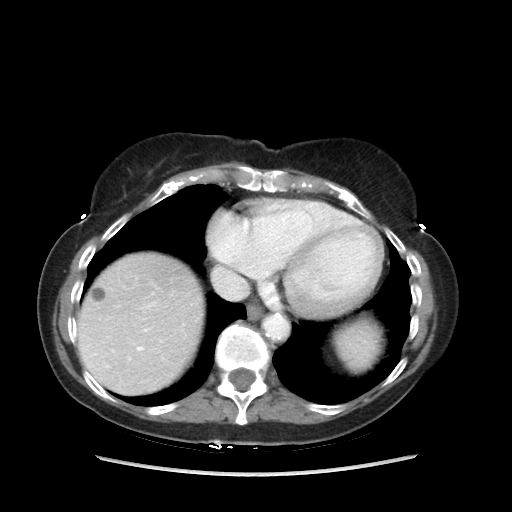
[im 71/81  lung]
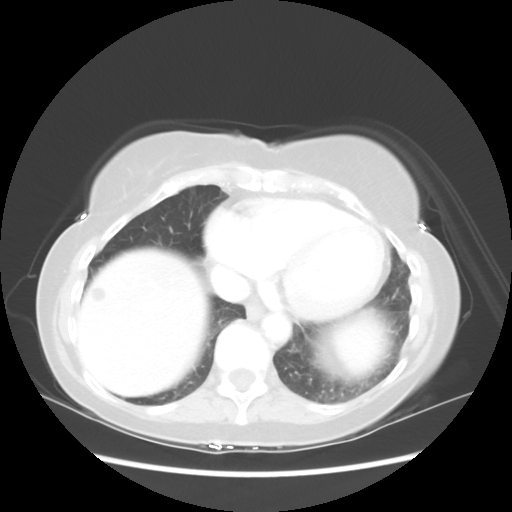

[Series 601: coronal body · coronal · 0.82mm/px · 1 of 100 slices shown, 2 images]
[im 34/100  soft-tissue]
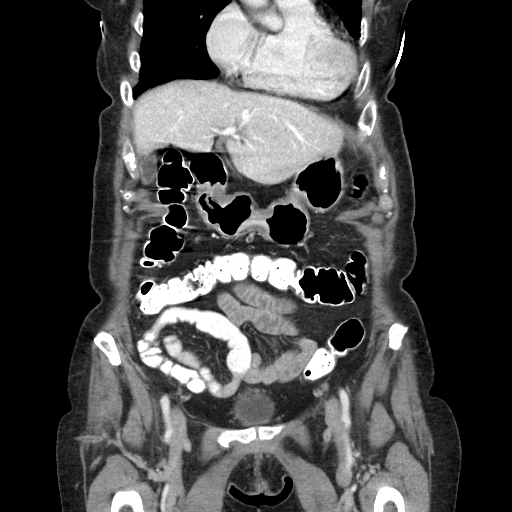
[im 34/100  bone]
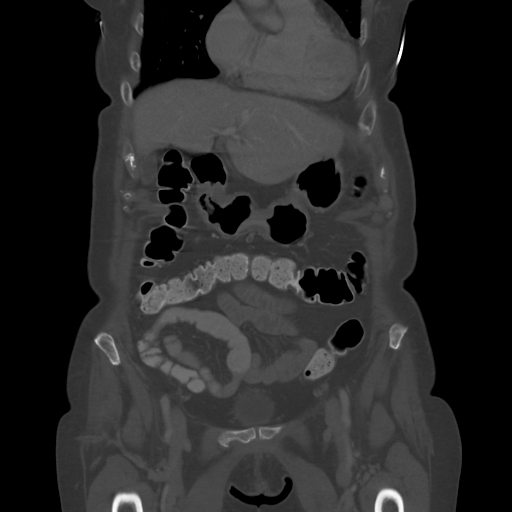

[Series 602: sagittal body · sagittal · 0.82mm/px · 5 of 144 slices shown]
[im 9/144  soft-tissue]
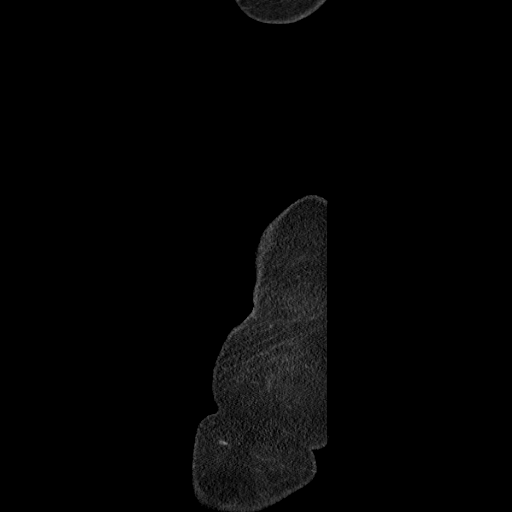
[im 27/144  soft-tissue]
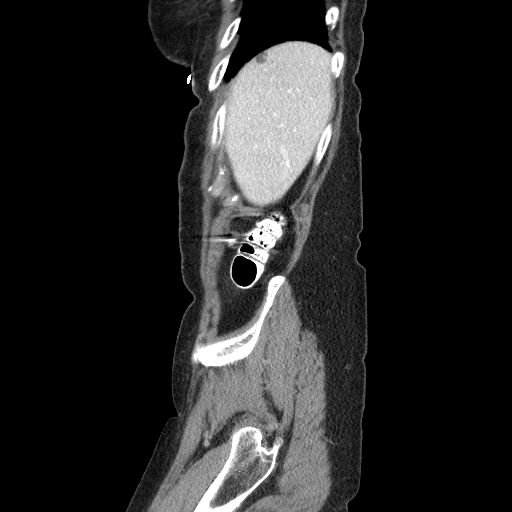
[im 45/144  soft-tissue]
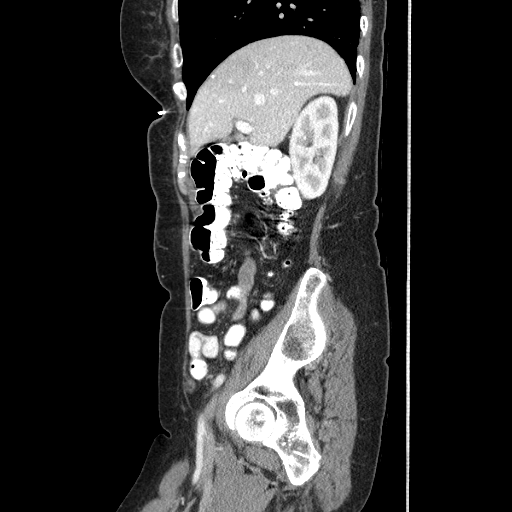
[im 63/144  soft-tissue]
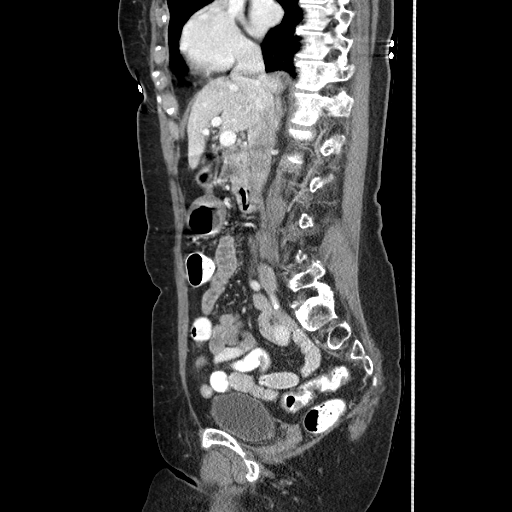
[im 81/144  soft-tissue]
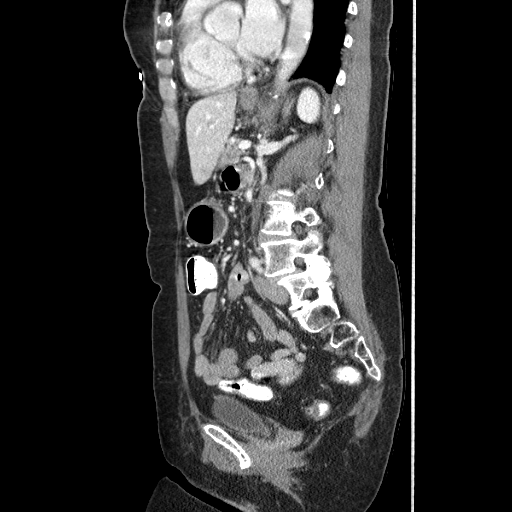

[13 of 36 positions shown; findings below may reference images not displayed]

FINDINGS: Lung bases are clear. No pericardial fluid. There is a low-density
lesion in the right hepatic lobe measuring 9 mm which does not have
simple fluid attenuation but cannot be further characterized. This
lesion is unchanged from CT of 05/30/2003. The gallbladder,
pancreas, adrenal glands are normal. There are multiple granulomata
within the spleen. The kidneys are normal.

The stomach, small bowel, appendix, cecum normal. The colon and
rectosigmoid colon are normal. There are several diverticula of the
sigmoid colon without acute inflammation.

There is no significant umbilical hernia. Tiny left inguinal hernia.

Abdominal or is normal caliber. No retroperitoneal periportal
lymphadenopathy. . No free fluid the pelvis. Post hysterectomy
anatomy. Postsurgical change at the bladder neck. No pelvic
lymphadenopathy. Adnexa are normal. No aggressive osseous lesion.
IMPRESSION: 1. No explanation for periumbilical pain. No ventral hernia or
significant inguinal hernia.
2. Normal volume of stool.
3. Normal appendix.
4. Post hysterectomy.

## 2015-11-03 ENCOUNTER — Ambulatory Visit (HOSPITAL_BASED_OUTPATIENT_CLINIC_OR_DEPARTMENT_OTHER): Payer: Medicare PPO | Attending: Neurology

## 2015-11-03 DIAGNOSIS — G4733 Obstructive sleep apnea (adult) (pediatric): Secondary | ICD-10-CM

## 2015-11-06 ENCOUNTER — Ambulatory Visit: Payer: Medicare PPO | Attending: Gastroenterology

## 2015-11-06 DIAGNOSIS — N3941 Urge incontinence: Secondary | ICD-10-CM | POA: Insufficient documentation

## 2015-11-06 DIAGNOSIS — N393 Stress incontinence (female) (male): Secondary | ICD-10-CM | POA: Insufficient documentation

## 2015-11-06 DIAGNOSIS — R531 Weakness: Secondary | ICD-10-CM | POA: Insufficient documentation

## 2015-11-12 ENCOUNTER — Encounter (HOSPITAL_BASED_OUTPATIENT_CLINIC_OR_DEPARTMENT_OTHER): Payer: Self-pay

## 2015-11-13 ENCOUNTER — Ambulatory Visit (HOSPITAL_BASED_OUTPATIENT_CLINIC_OR_DEPARTMENT_OTHER): Payer: Medicare PPO

## 2015-11-16 ENCOUNTER — Telehealth (INDEPENDENT_AMBULATORY_CARE_PROVIDER_SITE_OTHER): Payer: Self-pay | Admitting: Internal Medicine

## 2015-11-16 DIAGNOSIS — N760 Acute vaginitis: Secondary | ICD-10-CM

## 2015-11-16 DIAGNOSIS — B9689 Other specified bacterial agents as the cause of diseases classified elsewhere: Secondary | ICD-10-CM

## 2015-11-16 MED ORDER — METRONIDAZOLE 500 MG OR TABS
500.0000 mg | ORAL_TABLET | Freq: Two times a day (BID) | ORAL | 0 refills | Status: DC
Start: 2015-11-16 — End: 2016-04-28

## 2015-11-16 NOTE — Telephone Encounter (Signed)
Patient called to request BV treatment from PCP w/ out being seen for the following symptoms.    4-5 days vaginal itch/dischage. Patient states she has a history of BV, patient requests the following. The one time dose prescription costs too much.    MetroNIDAZOLE 500 MG Oral Tab (Discontinued)        Sig: Take 1 tablet (500 mg) by mouth 2 times a day. Take until gone

## 2015-11-16 NOTE — Telephone Encounter (Signed)
OK for one refill.     If symptoms persist or recur rapidly, should have f/u appt.

## 2015-11-16 NOTE — Telephone Encounter (Signed)
Left detailed message on voice mail relaying below

## 2015-11-19 ENCOUNTER — Ambulatory Visit (HOSPITAL_BASED_OUTPATIENT_CLINIC_OR_DEPARTMENT_OTHER): Payer: Medicare PPO

## 2015-11-21 ENCOUNTER — Telehealth (INDEPENDENT_AMBULATORY_CARE_PROVIDER_SITE_OTHER): Payer: Self-pay | Admitting: Internal Medicine

## 2015-11-21 NOTE — Telephone Encounter (Signed)
Had sleep study-Mild apnea, please offer ov at [facility] to discuss study and treatment options

## 2015-11-23 NOTE — Telephone Encounter (Signed)
Scheduled. Closing.

## 2015-12-02 ENCOUNTER — Ambulatory Visit (INDEPENDENT_AMBULATORY_CARE_PROVIDER_SITE_OTHER): Payer: Medicare PPO | Admitting: Internal Medicine

## 2015-12-02 VITALS — BP 142/74 | Temp 97.2°F | Resp 12 | Wt 117.0 lb

## 2015-12-02 DIAGNOSIS — Z6822 Body mass index (BMI) 22.0-22.9, adult: Secondary | ICD-10-CM

## 2015-12-02 DIAGNOSIS — G4733 Obstructive sleep apnea (adult) (pediatric): Secondary | ICD-10-CM

## 2015-12-02 NOTE — Progress Notes (Signed)
Robin Hart  Chief Complaint   Patient presents with   . 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 She is tired, has awakinings. She is here to discuss study and treatment options.  Past Medical History:   Diagnosis Date   . Arthritis    . Cancer ([place])     skin cell carcinoma    . Constipation    . Diverticulosis    . Glaucoma    . H. pylori infection    . Headache    . History of diarrhea    . History of irritable bowel syndrome    . Hypertension    . Lipidemia    . Osteoporosis      Current Outpatient Prescriptions   Medication Sig Dispense Refill   . ACETAMINOPHEN-CODEINE #4 OR Cough syrup     . Acetaminophen-Codeine 120-12 MG/5ML Oral Solution take 5 milliliter by mouth every 6 hours if needed for cough 118 mL 0   . Latanoprost 0.005 % Ophthalmic Solution Place 1 drop in each EYE at bedtime.  1   . MetroNIDAZOLE 500 MG Oral Tab Take 1 tablet (500 mg) by mouth 2 times a day. Take until gone. (Patient not taking: Reported on 12/02/2015) 14 tablet 0   . RaNITidine HCl 150 MG Oral Tab Take 1 tablet (150 mg) by mouth daily as needed (nausea). 30 tablet 1   . Spacer/Aero-Holding Chambers (AEROCHAMBER MV) Misc Use. 1 each 0   . Timolol Maleate 0.5 % Ophthalmic GEL FORMING SOLUTION Place 1 drop in each EYE every morning.  1     No current facility-administered medications for this visit.      Social History     Social History   . Marital status: N/A     Spouse name: N/A   . Number of children: N/A   . Years of education: N/A     Occupational History   . Not on file.     Social History Main Topics   . Smoking status: Never Smoker   . Smokeless tobacco: Never Used   . Alcohol use Yes   . Drug use: No   . Sexual activity: Not on file     Other Topics Concern   . Not on file     Social History Narrative   . No narrative on file     Review of patient's allergies indicates:  Allergies   Allergen Reactions   . Caffeine      Other reaction(s): Nausea Only  Extreme  nervousness      Past Surgical History:   Procedure Laterality Date   . Basal cell carcinoma excision     . BLADDER SUSPENSION     . BREAST BIOPSY     . COLONOSCOPY STOMA DX INCLUDING COLLJ SPEC SPX     . TOTAL ABDOMINAL HYSTERECT W/WO RMVL TUBE OVARY       Patient Active Problem List   Diagnosis   . Hypertension   . Irritable bowel syndrome with diarrhea   . Sacroiliac joint dysfunction   . Osteoporosis   . OAB (overactive bladder)   . Primary insomnia   . Pelvic floor dysfunction   . Chronic idiopathic constipation   . Nausea     ROS:  CONST: Fatigue    EXAM  Vitals:    12/02/15 1104   BP: 142/74   BP Cuff Size: Regular  BP Site: Right Arm   BP Position: Sitting   Resp: 12   Temp: 97.2 F (36.2 C)   TempSrc: Temporal   SpO2: 97%   Weight: 117 lb (53.1 kg)       1. Obstructive apnea  Discussed mild apnea, by UARS insurance rules. The only way to assess impact of mild apnea, is with a treatment trial-which will have to be self pay. She would like to rent APAP 1 month, and follow-up in 2 weeks-to see how she is doing and if needs adjustments. She is advised to not drive drowsy, bring CPAP, mask and power cords to ov-sooner as needed.     Follow up appointment in 2-4 weeks. Will return sooner or ER if worsens.

## 2015-12-02 NOTE — Patient Instructions (Signed)
It was a pleasure to see you in clinic today.               Dr Roberto Scales is sending orders for a cpap therapy rental to [facility] [id number] [email or web address]     If you are not yet signed up for eCare, please see the below eCare section at the end of this document for how to enroll and to get your access codes.  It's easy to sign up at home today.    eCare  enrollment will allow you access to the below benefits   You can make appointments online   View test results / Lab Results   Request prescription renewals   Obtain a copy of our After Visit Summary (an electronic copy of this document)     Your Test Results:  If labs were ordered today the results are expected to be available via eCare in about 5 days. If you have an active eCare account, this is how we will notify you of your results.     If you do not have an eCare account then your test results will be mailed to you within about 14 days after your tests are completed. If your physician needs to change your care based on your results or is concerned, you should expect a phone call or eCare message from your provider.    If you have any questions about your test results please schedule an appointment with your provider, so that we may review them with you in greater detail.    **If it has been more than 2 weeks and you have not received your test results please send our office a message via [place].    Medication Refills: If you need a prescription refilled, please contact your pharmacy 1 week before your current supply will run out to request the refill.  Contacting your pharmacy is the fastest and safest way to obtain a medication refill.  The pharmacy will notify our office.  Please note, that a minimum of 48 to 72 hours is needed to refill a medication,  Please call your pharmacy early to allow enough time to refill before you anticipate running out.  For faster medication refills, you can also schedule an appointment with your provider.    We  know you have a choice in where you receive your healthcare and we sincerely thank you for trusting [facility] with your health.

## 2015-12-03 ENCOUNTER — Encounter (INDEPENDENT_AMBULATORY_CARE_PROVIDER_SITE_OTHER): Payer: Self-pay | Admitting: Internal Medicine

## 2015-12-03 ENCOUNTER — Ambulatory Visit (HOSPITAL_BASED_OUTPATIENT_CLINIC_OR_DEPARTMENT_OTHER): Payer: Medicare PPO

## 2015-12-03 DIAGNOSIS — G8929 Other chronic pain: Secondary | ICD-10-CM

## 2015-12-03 DIAGNOSIS — M545 Low back pain: Secondary | ICD-10-CM

## 2015-12-03 DIAGNOSIS — M25551 Pain in right hip: Secondary | ICD-10-CM

## 2015-12-04 ENCOUNTER — Encounter (INDEPENDENT_AMBULATORY_CARE_PROVIDER_SITE_OTHER): Payer: Self-pay | Admitting: Internal Medicine

## 2015-12-04 DIAGNOSIS — Z85828 Personal history of other malignant neoplasm of skin: Secondary | ICD-10-CM

## 2015-12-04 NOTE — Telephone Encounter (Signed)
Please advise if you are able to write a referral for patient to see dermatology.

## 2015-12-04 NOTE — Telephone Encounter (Signed)
Please advise if you are able to write referral for patient or if she needs an appointment.

## 2015-12-05 NOTE — Telephone Encounter (Signed)
Per Bea Laura pt is stating her insurance is not covering her C-Pap machine, she is wanting suggestions on how to proceed.    Please advise. Thank you!

## 2015-12-11 ENCOUNTER — Encounter (HOSPITAL_BASED_OUTPATIENT_CLINIC_OR_DEPARTMENT_OTHER): Payer: Medicare PPO

## 2015-12-11 ENCOUNTER — Other Ambulatory Visit (INDEPENDENT_AMBULATORY_CARE_PROVIDER_SITE_OTHER): Payer: Self-pay | Admitting: Internal Medicine

## 2015-12-11 NOTE — Progress Notes (Signed)
Labels printed for assistance in cpap supplies order with Dr Esther Hardy [facility]    Vergia Alberts MA  [facility]

## 2015-12-11 NOTE — Telephone Encounter (Signed)
Referrals up front for pt, pt notified.

## 2015-12-15 ENCOUNTER — Other Ambulatory Visit: Payer: Self-pay | Admitting: Internal Medicine

## 2015-12-15 ENCOUNTER — Ambulatory Visit: Payer: Medicare PPO | Attending: Internal Medicine

## 2015-12-15 ENCOUNTER — Encounter: Payer: Self-pay | Admitting: Gastroenterology

## 2015-12-15 DIAGNOSIS — N63 Unspecified lump in breast: Secondary | ICD-10-CM

## 2015-12-17 ENCOUNTER — Ambulatory Visit: Payer: Medicare PPO | Attending: Gastroenterology

## 2015-12-17 DIAGNOSIS — N393 Stress incontinence (female) (male): Secondary | ICD-10-CM | POA: Insufficient documentation

## 2015-12-17 DIAGNOSIS — R531 Weakness: Secondary | ICD-10-CM | POA: Insufficient documentation

## 2015-12-17 DIAGNOSIS — N3941 Urge incontinence: Secondary | ICD-10-CM | POA: Insufficient documentation

## 2015-12-18 ENCOUNTER — Encounter (HOSPITAL_BASED_OUTPATIENT_CLINIC_OR_DEPARTMENT_OTHER): Payer: Medicare PPO

## 2015-12-25 ENCOUNTER — Encounter (HOSPITAL_BASED_OUTPATIENT_CLINIC_OR_DEPARTMENT_OTHER): Payer: Medicare PPO

## 2016-01-08 ENCOUNTER — Ambulatory Visit: Payer: Medicare PPO | Attending: Gastroenterology

## 2016-01-08 DIAGNOSIS — N3941 Urge incontinence: Secondary | ICD-10-CM | POA: Insufficient documentation

## 2016-01-08 DIAGNOSIS — R531 Weakness: Secondary | ICD-10-CM | POA: Insufficient documentation

## 2016-01-08 DIAGNOSIS — N393 Stress incontinence (female) (male): Secondary | ICD-10-CM | POA: Insufficient documentation

## 2016-01-12 ENCOUNTER — Encounter (HOSPITAL_BASED_OUTPATIENT_CLINIC_OR_DEPARTMENT_OTHER): Payer: Medicare PPO

## 2016-01-13 ENCOUNTER — Encounter (INDEPENDENT_AMBULATORY_CARE_PROVIDER_SITE_OTHER): Payer: Self-pay | Admitting: Internal Medicine

## 2016-01-19 ENCOUNTER — Encounter (HOSPITAL_BASED_OUTPATIENT_CLINIC_OR_DEPARTMENT_OTHER): Payer: Medicare PPO

## 2016-01-25 ENCOUNTER — Ambulatory Visit (INDEPENDENT_AMBULATORY_CARE_PROVIDER_SITE_OTHER): Payer: Medicare PPO

## 2016-01-25 DIAGNOSIS — Z23 Encounter for immunization: Secondary | ICD-10-CM

## 2016-01-25 NOTE — Progress Notes (Signed)
Vaccine Screening Questions      1.  Have you had a serious reaction or an allergic reaction to a vaccine?  NO    2.  Currently have a moderate or severe illness, including fever? (Don't Ask if vaccine   ordered by provider same day)  NO    3.  Ever had a seizure or a brain or other nervous system problem syndrome associated with a vaccine? (DTaP/TDaP/DTP pertinent) NO    4.  Is patient receiving  any live vaccinations today? (Varicella-Chickenpox, MMR-Measles/Mumps/Rubella, Zoster-Shingles)  NO    If YES to any of the questions above - Do NOT give vaccine.  Consult with RN or provider in clinic.  (#4 can be YES if all Live vaccine questions are answered NO)    If NO to all questions above - Patient may receive vaccine.    5.  Do you need to receive the Flu vaccine today? YES - Additional Flu Questions  Flu Vaccine Screening Questions:    Ever had a serious allergic reaction to eggs?  NO    Ever had Guillain-Barre syndrome associated with a vaccine? NO    Less than 6 months old? NO    If YES to any of the Flu questions above - NO Flu Vaccine to be given.  Patient may consult provider as needed.    If NO to all questions above - Patient may receive Flu Shot (IM)    If between 6 months and 8 years of age, was flu vaccine received last year?  N/A  If NO to above question:  . Children who are receiving influenza vaccine for the first time - administer 2 doses of the current influenza vaccine (separated by at least 4 weeks).          Vaccine information sheet(s) discussed, patient/parent/guardian verbalized understanding? yes    VIS given 01/25/2016 by Kathryn Richey, MA .    Immunization Documentation:    Patient/Parent has reviewed the appropriate VIS and has all questions answered? YES    Vaccine(s) given today without initial adverse effect? YES    Kathryn Richey  C-MA (AAMA)

## 2016-01-26 ENCOUNTER — Ambulatory Visit (HOSPITAL_BASED_OUTPATIENT_CLINIC_OR_DEPARTMENT_OTHER): Payer: Medicare PPO

## 2016-02-02 ENCOUNTER — Encounter (HOSPITAL_BASED_OUTPATIENT_CLINIC_OR_DEPARTMENT_OTHER): Payer: Medicare PPO

## 2016-02-08 ENCOUNTER — Encounter (HOSPITAL_BASED_OUTPATIENT_CLINIC_OR_DEPARTMENT_OTHER): Payer: Medicare PPO

## 2016-02-10 ENCOUNTER — Telehealth: Payer: Self-pay | Admitting: Internal Medicine

## 2016-02-10 DIAGNOSIS — J329 Chronic sinusitis, unspecified: Secondary | ICD-10-CM

## 2016-02-10 NOTE — Telephone Encounter (Signed)
(  TEXTING IS AN OPTION FOR [facility] CLIN[facility] ONLY)  Is this a [place] clinic? No      RETURN CALL: Detailed message on voicemail only      SUBJECT:  General Message     REASON FOR REQUEST: Referral    MESSAGE: Patient wants to know if she needs to see Volkman, Mathews Robinsons, MD to get a referral for the Ear, Nose and Throat Specialist . Thank you.

## 2016-02-11 ENCOUNTER — Telehealth (INDEPENDENT_AMBULATORY_CARE_PROVIDER_SITE_OTHER): Payer: Self-pay | Admitting: Internal Medicine

## 2016-02-11 NOTE — Telephone Encounter (Signed)
No reason for referral indicated.   Follow-up message sent to patient to confirm reason for referral.

## 2016-02-11 NOTE — Telephone Encounter (Signed)
ENT referral pended, routing to provider to complete/sign if agree.   Thank you!    Reason for request: "I have a scratchy throat, ears feel full, and nose is stopped up. I had 8 bouts of upper respiratory infections last year."

## 2016-02-11 NOTE — Telephone Encounter (Signed)
(  TEXTING IS AN OPTION FOR [facility] CL[facility] ONLY)  Is this a [place] clinic? Yes. Patient declined the option to receive mobile text messages.    RETURN CALL: Detailed message on voicemail only    SUBJECT:  General Message     REASON FOR REQUEST: Patient called in with questions regarding a referral to ENT.    Referral is incomplete and in a pending status.    Patient is requesting that the referral be directed to [facility] ENT, Dr. Kirke Shaggy.    MESSAGE: Please contact patient and advise. Thank you!

## 2016-02-12 NOTE — Telephone Encounter (Signed)
Referral redirected to [facility] ENT    Pt advised and given scheduling instructions    Closing TE

## 2016-02-19 ENCOUNTER — Telehealth (INDEPENDENT_AMBULATORY_CARE_PROVIDER_SITE_OTHER): Payer: Self-pay | Admitting: Internal Medicine

## 2016-02-19 NOTE — Telephone Encounter (Signed)
(  TEXTING IS AN OPTION FOR [facility] CLINICS ONLY)  Is this a [place] clinic? na      RETURN CALL: General message OK      SUBJECT:  General Message     REASON FOR REQUEST: CPAP presription    MESSAGE: Received call from Ivin Booty from [facility] stating that she will fax a request for CPAP.

## 2016-02-20 NOTE — Telephone Encounter (Signed)
Please help with this

## 2016-02-22 ENCOUNTER — Encounter (HOSPITAL_BASED_OUTPATIENT_CLINIC_OR_DEPARTMENT_OTHER): Payer: Medicare PPO

## 2016-02-22 NOTE — Telephone Encounter (Signed)
Called [facility] and [place] center and spoke to [place] and told her that we did not received and to re-fax it again to [phone] TE CLOSED

## 2016-03-14 ENCOUNTER — Encounter (INDEPENDENT_AMBULATORY_CARE_PROVIDER_SITE_OTHER): Payer: Self-pay | Admitting: Otolaryngology

## 2016-03-14 ENCOUNTER — Ambulatory Visit (INDEPENDENT_AMBULATORY_CARE_PROVIDER_SITE_OTHER): Payer: Medicare PPO | Admitting: Otolaryngology

## 2016-03-14 VITALS — BP 160/70 | HR 64 | Resp 14 | Ht 61.0 in | Wt 118.0 lb

## 2016-03-14 DIAGNOSIS — H6063 Unspecified chronic otitis externa, bilateral: Secondary | ICD-10-CM

## 2016-03-14 DIAGNOSIS — J329 Chronic sinusitis, unspecified: Secondary | ICD-10-CM

## 2016-03-14 DIAGNOSIS — Z6822 Body mass index (BMI) 22.0-22.9, adult: Secondary | ICD-10-CM

## 2016-03-14 MED ORDER — HYDROCORTISONE 1 % EX CREA
TOPICAL_CREAM | CUTANEOUS | 2 refills | Status: DC
Start: 2016-03-14 — End: 2016-07-19

## 2016-03-14 NOTE — Patient Instructions (Signed)
Patient Education     Anatomy of the Ear    The ear is a complex and delicate organ. It collects sound waves so you can hear the world around you. The ear also has a second function--it helps you keep your balance. Your ear can be divided into 3 parts. The outer ear and middle ear help collect and amplify sound. The inner ear converts sound waves to messages that are sent to the brain. The inner ear also senses the movement and position of your head and body so you can maintain your balance and see clearly, even when you change positions.  The mastoid bone surrounds the middle ear. The external ear collects sound waves. The ear canal carries sound waves to the eardrum. The eardrum vibrates from sound waves, setting the middle ear bones in motion. The middle ear bones (ossicles) vibrate, transmitting sound waves to the inner ear. When the ear is healthy, air pressure remains balanced in the middle ear. The eustachian tube helps control air pressure in the middle ear.The semicircular canals help maintain balance. The vestibular nerve carries balance signals to the brain. The auditory nerve carries sound signals to the brain. The cochlea picks up sound waves and makes nerve signals.    Date Last Reviewed: 01/03/2015   2000-2017 [employer] [address], [place] [address]. All rights reserved. This information is not intended as a substitute for professional medical care. Always follow your healthcare professional's instructions.

## 2016-03-14 NOTE — Progress Notes (Signed)
03/14/2016    Seen in consultation at the request of:      Dear Dr. Rachel Moulds,      Thank you for trusting me with the care of [facility].     Please feel free to call me with any questions (office: [phone]).      Sincerely,                 Kirke Shaggy, MD         CONSULTATION      PATIENT:  Robin Hart  DOB:  [birth year]  MRN:  [id number]      CHIEF COMPLAINT/HPI:  Patient is a 75 year old female who presented with recurrent sinus infections for several years.    Symptoms are moderate, intermittent, and gradually improving.  Patient has 8 episodes last year.    She has not had any infections this year.  Primary presenting symptom is cough.  It occurred   with URI and exposure to her grandchildren (currently 64 yo and 28 yo). Allergy testing is negative.    Symptoms improved with washing her hands, saline nasal sprays, and antibiotics, including   Z-pack.  She recently started a humidified CPAP for OSA.  Associated symptoms include nasal   congestion, anterior nasal drainage, and posterior nasal drainage.  She denies headache, facial   tenderness, nasal obstruction, and fever.        Prior Imaging:   CXR 08/06/15:  NAD           Patient also reported itchy ears for several months.  Symptoms are mild-moderate, continuous,   and unchanged.  She denies otalgia, otorrhea, tinnitus, vertigo, and hearing loss.  The patient   has no past history of otological surgery, otological trauma, significant noise exposure, RAOM,   and family history of hearing loss.      Prior testing: none    Audiogram:     CT temporal bone:     MRI brain/internal auditory canals:        HEARING AID USE:  None        PMHX:  Past Medical History:   Diagnosis Date   . Arthritis    . Cancer ([place])     skin cell carcinoma    . Constipation    . Diverticulosis    . Glaucoma    . H. pylori infection    . Headache    . History of diarrhea    . History of irritable bowel syndrome    . Hypertension    . Lipidemia    . Osteoporosis       PSHX:  Past Surgical History:   Procedure Laterality Date   . Basal cell carcinoma excision     . BLADDER SUSPENSION     . BREAST BIOPSY     . COLONOSCOPY STOMA DX INCLUDING COLLJ SPEC SPX     . TOTAL ABDOMINAL HYSTERECT W/WO RMVL TUBE OVARY       FHX: @HXFAMILY @  SHX:  Social History     Social History   . Marital status: N/A     Spouse name: N/A   . Number of children: N/A   . Years of education: N/A     Occupational History   . Not on file.     Social History Main Topics   . Smoking status: Never Smoker   . Smokeless tobacco: Never Used   . Alcohol use Yes   . Drug use: No   .  Sexual activity: Not on file     Other Topics Concern   . Not on file     Social History Narrative   . No narrative on file       ALLERGIES:  is allergic to caffeine.    MEDICATIONS:  has a current medication list which includes the following prescription(s): acetaminophen-codeine, acetaminophen-codeine, hydrocortisone, latanoprost, metronidazole, ranitidine hcl, aerochamber mv, and timolol maleate.       REVIEW OF SYSTEMS:    Constitutional: denies fatigue, fever, chills, sweats   Eye: negative  ENT:  + sinusitis, nasal congestion, nasal drainage, post-nasal drainage, itchy ears.  Denies excessive cerumen, hearing loss, fullness in ears, infections, otalgia, tinnitus, vertigo, noise exposure, Nose/Sinus: anosmia/hyposmia, epistaxis, facial pain,  nasal obstruction, sneezing, Throat: taste change, voice change, hoarseness, dysphagia, lump in throat, odynophagia, sore tongue, mouth sores, pharyngitis, snoring, tooth pain   Cardiovascular: negative   Respiratory:  + cough  Gastrointestinal:  Negative  Endocrine:  negative    Neurological: + OSA  Psychiatric:  negative   Genitourinary:  negative .  Integumentary: negative and no rashes nor lesions of concern   Musculoskeletal: negative   Hematologic: negative  Immunologic: no environmental allergies       PHYSICAL EXAMINATION:  Vitals:    03/14/16 1442   BP: 160/70   BP Cuff Size: Regular    BP Site: Left Arm   BP Position: Sitting   Pulse: 64   Resp: 14   Weight: 118 lb (53.5 kg)   Height: 5\' 1"  (1.549 m)        General: Awake and alert. Well developed and participated in the patient interview appropriately.  Eyes: Pupils equal, round and reactive to light.  Extraocular movements intact.  Ears: visualized by microscopy    Right: Pinna unremarkable.  External ear canal without masses or skin abnormality. Free of cerumen. Tympanic membrane intact and without retraction or perforation. Middle ear without fluid.    Left: Pinna unremarkable.  External ear canal without masses or skin abnormality. Free of cerumen. Tympanic membrane intact and without retraction or perforation. Middle ear without fluid.      Nose:  Nasal mucosa normal with clear mucus.  Midline septum with no polyps or lesions.  Mild turbinate hypertrophy.  OP: Clear without masses or ulcerations. Normal lips, teeth, and gums.  Normal hard palate, soft palate, and posterior pharynx. No parotid or submandibular gland tenderness, masses, or swelling.  2+ tonsils.  HP:  Clear without mucosal abnormalities or masses  Neck: Supple and without masses, normal carotid pulses, midline trachea, normal thyroid without enlargement, tenderness, and masses.  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

## 2016-03-27 ENCOUNTER — Emergency Department
Admission: EM | Admit: 2016-03-27 | Discharge: 2016-03-27 | Disposition: A | Discharge status: Home / Self Care | Payer: Medicare PPO | Attending: Emergency Medicine | Admitting: Emergency Medicine

## 2016-03-27 DIAGNOSIS — Z77098 Contact with and (suspected) exposure to other hazardous, chiefly nonmedicinal, chemicals: Secondary | ICD-10-CM | POA: Insufficient documentation

## 2016-03-27 DIAGNOSIS — H53141 Visual discomfort, right eye: Secondary | ICD-10-CM | POA: Insufficient documentation

## 2016-03-27 DIAGNOSIS — H578 Other specified disorders of eye and adnexa: Secondary | ICD-10-CM | POA: Insufficient documentation

## 2016-03-27 DIAGNOSIS — I10 Essential (primary) hypertension: Secondary | ICD-10-CM | POA: Insufficient documentation

## 2016-03-29 ENCOUNTER — Encounter (INDEPENDENT_AMBULATORY_CARE_PROVIDER_SITE_OTHER): Payer: Self-pay | Admitting: Internal Medicine

## 2016-03-30 NOTE — Telephone Encounter (Signed)
Defer to sleep MD.

## 2016-03-30 NOTE — Telephone Encounter (Signed)
Please advise 

## 2016-04-07 NOTE — Telephone Encounter (Signed)
Dr. Roberto Scales- your next available appointment is not until January 24 at [place].  Would you like me to schedule patient in this time slot if she is available, or is there a good day/time in which you would like to see her before that?

## 2016-04-09 ENCOUNTER — Telehealth (INDEPENDENT_AMBULATORY_CARE_PROVIDER_SITE_OTHER): Payer: Self-pay | Admitting: Internal Medicine

## 2016-04-09 NOTE — Telephone Encounter (Signed)
Called and scheduled for 04/13/16 for 2:40

## 2016-04-09 NOTE — Telephone Encounter (Signed)
Please assist with appt per Dr. Roberto Scales note

## 2016-04-09 NOTE — Telephone Encounter (Signed)
I have a 2:40 opening on Weds 1/10. Please block this slot and offer to her-have her bring her CPAP, mask and power cords, If not resolved-please send te back to me

## 2016-04-13 ENCOUNTER — Encounter (INDEPENDENT_AMBULATORY_CARE_PROVIDER_SITE_OTHER): Payer: Self-pay | Admitting: Internal Medicine

## 2016-04-13 ENCOUNTER — Ambulatory Visit (INDEPENDENT_AMBULATORY_CARE_PROVIDER_SITE_OTHER): Payer: Medicare PPO | Admitting: Internal Medicine

## 2016-04-13 VITALS — BP 158/68 | HR 68 | Temp 98.8°F | Resp 12 | Wt 120.0 lb

## 2016-04-13 DIAGNOSIS — Z6822 Body mass index (BMI) 22.0-22.9, adult: Secondary | ICD-10-CM

## 2016-04-13 DIAGNOSIS — G4733 Obstructive sleep apnea (adult) (pediatric): Secondary | ICD-10-CM

## 2016-04-13 NOTE — Patient Instructions (Signed)
[facility]  Directions      Address:  [facility]  [place] [address]

## 2016-04-13 NOTE — Progress Notes (Signed)
Reason for Visit: pt is here for a follow up on CPAP. Pt states it is uncomfortable, not helping with sleep- pt states that she still wakes 3 to 4 hours in to sleep.      Have you seen a specialist since your last visit: No    Vaccines Due? Yes, YES-     PHQ2 done in the last year (365 days)? YES     Does patient have eCare?  YES     HM Due:   Health Maintenance   Topic Date Due   . Tetanus Vaccine  03/05/1960   . Colorectal Cancer Screening (FOBT/FIT)  03/06/1991   . Zoster Vaccine  03/05/2001   . Pneumococcal Vaccine (1 of 2 - PCV13) 03/05/2006   . Lipid Disorders Screening  08/05/2020   . Osteoporosis Screening  Completed   . Influenza Vaccine  Completed       PCP Verified?  Yes, Volkman

## 2016-04-13 NOTE — Progress Notes (Signed)
Robin Hart  Chief Complaint   Patient presents with   . Follow-Up      HPI: She has mild apnea, having trouble with CPAP. Has a large [employer], and is leaking. She needs a new mask. She is concerned about compliance rules.   Past Medical History:   Diagnosis Date   . Arthritis    . Cancer ([place])     skin cell carcinoma    . Constipation    . Diverticulosis    . Glaucoma    . H. pylori infection    . Headache    . History of diarrhea    . History of irritable bowel syndrome    . Hypertension    . Lipidemia    . Osteoporosis      Current Outpatient Prescriptions   Medication Sig Dispense Refill   . ACETAMINOPHEN-CODEINE #4 OR Cough syrup     . Acetaminophen-Codeine 120-12 MG/5ML Oral Solution take 5 milliliter by mouth every 6 hours if needed for cough 118 mL 0   . Hydrocortisone 1 % External Cream Apply to ears BID prn itchy ears 1 Tube 2   . Latanoprost 0.005 % Ophthalmic Solution Place 1 drop in each EYE at bedtime.  1   . MetroNIDAZOLE 500 MG Oral Tab Take 1 tablet (500 mg) by mouth 2 times a day. Take until gone. 14 tablet 0   . RaNITidine HCl 150 MG Oral Tab Take 1 tablet (150 mg) by mouth daily as needed (nausea). 30 tablet 1   . Spacer/Aero-Holding Chambers (AEROCHAMBER MV) Misc Use. 1 each 0   . Timolol Maleate 0.5 % Ophthalmic GEL FORMING SOLUTION Place 1 drop in each EYE every morning.  1     No current facility-administered medications for this visit.      Social History     Social History   . Marital status: N/A     Spouse name: N/A   . Number of children: N/A   . Years of education: N/A     Occupational History   . Not on file.     Social History Main Topics   . Smoking status: Never Smoker   . Smokeless tobacco: Never Used   . Alcohol use Yes   . Drug use: No   . Sexual activity: Not on file     Other Topics Concern   . Not on file     Social History Narrative   . No narrative on file     Review of patient's allergies indicates:  Allergies   Allergen Reactions   . Caffeine      Other  reaction(s): Nausea Only  Extreme nervousness      Past Surgical History:   Procedure Laterality Date   . Basal cell carcinoma excision     . BLADDER SUSPENSION     . BREAST BIOPSY     . COLONOSCOPY STOMA DX INCLUDING COLLJ SPEC SPX     . TOTAL ABDOMINAL HYSTERECT W/WO RMVL TUBE OVARY       Patient Active Problem List   Diagnosis   . Hypertension   . Irritable bowel syndrome with diarrhea   . Sacroiliac joint dysfunction   . Osteoporosis   . OAB (overactive bladder)   . Primary insomnia   . Pelvic floor dysfunction   . Chronic idiopathic constipation   . Nausea     ROS:  CONST: Fatigue    EXAM  Vitals:    04/13/16 1421  BP: 158/68   BP Cuff Size: Regular   BP Site: Left Arm   BP Position: Sitting   Pulse: 68   Resp: 12   Temp: 98.8 F (37.1 C)   TempSrc: Temporal   Weight: 120 lb (54.4 kg)     DOWN LOAD: 4 hour compliance 13/30 AHI 2.5 90% pressure 13.9 leaki    1. Obstructive apnea  Discussed. Advised on compliance rules, reminded her to not drive if drowsy. Set floor pressure 7, refit mask in 2 days. Advised nightly CPAP and sleep med follow-up in 6 weeks-sooner as needed.    Follow up appointment in 6 weeks. Will return sooner or ER if worsens.

## 2016-04-20 ENCOUNTER — Encounter (INDEPENDENT_AMBULATORY_CARE_PROVIDER_SITE_OTHER): Payer: Self-pay | Admitting: Internal Medicine

## 2016-04-24 ENCOUNTER — Encounter (INDEPENDENT_AMBULATORY_CARE_PROVIDER_SITE_OTHER): Payer: Self-pay | Admitting: Internal Medicine

## 2016-04-24 DIAGNOSIS — I739 Peripheral vascular disease, unspecified: Secondary | ICD-10-CM

## 2016-04-25 ENCOUNTER — Ambulatory Visit: Payer: Medicare PPO | Attending: Gastroenterology | Admitting: Gastroenterology

## 2016-04-25 ENCOUNTER — Encounter (HOSPITAL_BASED_OUTPATIENT_CLINIC_OR_DEPARTMENT_OTHER): Payer: Self-pay | Admitting: Gastroenterology

## 2016-04-25 VITALS — BP 147/68 | HR 56 | Temp 96.3°F | Ht 61.0 in | Wt 120.0 lb

## 2016-04-25 DIAGNOSIS — R6881 Early satiety: Secondary | ICD-10-CM | POA: Insufficient documentation

## 2016-04-25 DIAGNOSIS — Z6822 Body mass index (BMI) 22.0-22.9, adult: Secondary | ICD-10-CM

## 2016-04-25 DIAGNOSIS — R140 Abdominal distension (gaseous): Secondary | ICD-10-CM | POA: Insufficient documentation

## 2016-04-25 DIAGNOSIS — R1030 Lower abdominal pain, unspecified: Secondary | ICD-10-CM | POA: Insufficient documentation

## 2016-04-25 DIAGNOSIS — R110 Nausea: Secondary | ICD-10-CM | POA: Insufficient documentation

## 2016-04-25 DIAGNOSIS — K5901 Slow transit constipation: Secondary | ICD-10-CM | POA: Insufficient documentation

## 2016-04-25 DIAGNOSIS — M6289 Other specified disorders of muscle: Secondary | ICD-10-CM | POA: Insufficient documentation

## 2016-04-25 MED ORDER — RANITIDINE HCL 150 MG OR TABS
300.0000 mg | ORAL_TABLET | Freq: Every evening | ORAL | 4 refills | Status: DC
Start: 2016-04-25 — End: 2016-04-26

## 2016-04-25 NOTE — Patient Instructions (Addendum)
1. Trial of daily tap water enemas for at least one week - goal: more regular bowel movements which can then hopefully reduce your pain and bloating  2. Also re-referral for pelvic floor biofeedback with Beatriz Stallion at [facility]  3. For nausea, trial of ranitidine 300mg  by mouth before bedtime. If no difference after 2 weeks, then stop.  4. Wireless motility capsule to assess full gastrointestinal transit time.  If your insurance company does not cover this, will start with gastric emptying study to look at your stomach emptying time.  5. Okay to consume coke (the drink) on a regular basis    Follow-up in 3 months

## 2016-04-25 NOTE — Telephone Encounter (Signed)
Please see eCare message and advise

## 2016-04-25 NOTE — Progress Notes (Signed)
GASTROENTEROLOGY CLINIC NOTE    DATE OF SERVICE: 04/25/2016   LAST CLINIC VISIT: 05/04/2015    ID/CC:  76 year old female with a history of uterine prolapse s/p post hysterectomy and urge incontinence here for follow-up alternating diarrhea and constipation and nausea. Previously diagnosed with IBS.    HISTORY OF PRESENT ILLNESS:  Please refer to Dr. Eliott Nine note on 01/302/107 for further details.  In brief, history of longstanding bowel issues bothering her for 60+ years. Diagnosed as IBS and treated with laxatives for constipation. She also has a history of overactive bladder/urge incontinence, glaucoma, osteoporosis, sacroiliac joint dysfunction and s/p hysterectomy for uterine prolapse. She reports 3 vaginal deliveries and associated tearing with the first birth.     She also endorsed severe nausea without vomiting and noticeable abdominal bloating and distention that occurs within a few minutes of ingesting certain "trigger foods". Previous trial of omeprazole was not helpful. She identifies more symptoms with ingestion of chocolate, seeds, high fiber, high fat.  She'll within a few minutes of ingestion noticed some significant nausea without vomiting which will potentially be alleviated by ingestion of Coca-Cola.     Plan for MR defecography, EGD/colonoscopy, and referral to PT for biofeedback.  Also recommended a trial of H2 blocker and tap water enemas.    INTERVAL HISTORY:  Not doing well. Sleep very poorly. Doesn't like taking meds. Top priority is her gut as feels so bad. Continues to experiencing bloating/distention, and severe nausea. Also lower abdominal pain.  Unable to drink wine anymore due to pain.  No emesis.  No help with physical therapy. Having a bowel movement for 1 to 3 days and then a couple of times the following day. Feels better on the day more pooping.  Hard to predict patterns. Never tried the tap water enemas. PT worked more on abdominal massaging (saw [facility]).    Most bothered by  nausea and abdominal bloating. Hard to see if related to bowel movements.  Feels best when she doesn't eat. When she eats, feels really full after eating as though food just sitting there.    RELEVANT STUDIES:  MR defecography, 07/20/2015:  1. Severe descent of the posterior compartment during defecation.  2. Moderate bladder prolapse and mild vaginal prolapse during defecation.    EGD, 06/2015: normal; duodenum: increased intraepithelial lymphocytes but no villous blunting, stomach: chronic antral gastritis with intestinal metaplasia, neg H pylori    Colo, 06/2015: moderately severe diverticulosis in sigmoid and transverse colon; path: TI: normal, colon: normal    CT abd with contrast 08/07/13  FINDINGS:Lung bases are clear. No pericardial fluid. There is a low-densitylesion in the right hepatic lobe measuring 9 mm which does not havesimple fluid attenuation but cannot be further characterized. Thislesion is unchanged from CT of 05/30/2003. The gallbladder,pancreas, adrenal glands are normal. There are multiple granulomatawithin the spleen. The kidneys are normal.The stomach, small bowel, appendix, cecum normal. The colon andrectosigmoid colon are normal. There are several diverticula of thesigmoid colon without acute inflammation.There is no significant umbilical hernia. Tiny left inguinal hernia.Abdominal or is normal caliber. No retroperitoneal periportallymphadenopathy. . No free fluid the pelvis. Post hysterectomyanatomy. Postsurgical change at the bladder neck. No pelviclymphadenopathy. Adnexa are normal. No aggressive osseous lesion.IMPRESSION:1. No explanation for periumbilical pain. No ventral hernia orsignificant inguinal hernia.2. Normal volume of stool.3. Normal appendix.4. Post hysterectomy.    Endoscopy 2012: report not available    RUQ u/s 01/26/2010: normal     CURRENT AND PRIOR THERAPIES:  Linzess: tried  2 doses 2015, intolerant to diarrhea  Amitiza, "low dose trial" 08/23/13  Probiotic: align  Dietary  modification: gluten free, low fat, seeds/high fiber, chocolate avoidance  Fiber - not currently using, worsens nausea/bloating  miralax - 17g per day, loosens stool, stopped to minimize meds   Prunes    PAST MEDICAL & SURGICAL HISTORY:  Active Ambulatory Problems     Diagnosis Date Noted   . Hypertension 07/07/2014   . 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 Osteoporosis 11/21/2014   . OAB (overactive bladder) 03/05/2015   . Primary insomnia 03/05/2015   . Pelvic floor dysfunction 05/04/2015   . Chronic idiopathic constipation 05/04/2015   . Nausea 05/04/2015     Resolved Ambulatory Problems     Diagnosis Date Noted   . No Resolved Ambulatory Problems     Past Medical History:   Diagnosis Date   . Arthritis    . Cancer ([place])    . Constipation    . Diverticulosis    . Glaucoma    . H. pylori infection    . Headache    . History of diarrhea    . History of irritable bowel syndrome    . Hypertension    . Lipidemia    . Osteoporosis      Urge incontinence; tx posterior tibial nerve stimulator 2014, antimuscarinics, myrbetriq, (prev rec'd interstim, botox for refractory sx)  Chronic headache  IBS  HTN  Glaucoma  OA  3 vaginal births, +tearing  Colon polyps: last colo 2014?    [place]  Hysterectomy 1996  Bladder suspension 1985, 1995      ALL:  Review of patient's allergies indicates:  Allergies   Allergen Reactions   . Caffeine      Other reaction(s): Nausea Only  Extreme nervousness        MEDS:  Outpatient Medications Prior to Visit   Medication Sig Dispense Refill   . ACETAMINOPHEN-CODEINE #4 OR Cough syrup     . Acetaminophen-Codeine 120-12 MG/5ML Oral Solution take 5 milliliter by mouth every 6 hours if needed for cough 118 mL 0   . Hydrocortisone 1 % External Cream Apply to ears BID prn itchy ears 1 Tube 2   . Latanoprost 0.005 % Ophthalmic Solution Place 1 drop in each EYE at bedtime.  1   . MetroNIDAZOLE 500 MG Oral Tab Take 1 tablet (500 mg) by mouth 2 times  a day. Take until gone. 14 tablet 0   . RaNITidine HCl 150 MG Oral Tab Take 1 tablet (150 mg) by mouth daily as needed (nausea). 30 tablet 1   . Spacer/Aero-Holding Chambers (AEROCHAMBER MV) Misc Use. 1 each 0   . Timolol Maleate 0.5 % Ophthalmic GEL FORMING SOLUTION Place 1 drop in each EYE every morning.  1     No facility-administered medications prior to visit.        REVIEW OF SYSTEMS:  ROS as documented on the Patient Information Form completed by the patient and this was reviewed with the patient.    SOCIAL HISTORY:  Retired Location manager; moved from [place] to [place] to be close to her children (dtr is hospitalist for [place]); lives in [place]: denies  EtOH: 4 drinks/mo, no hx of heavy use  Drugs: denies  NSAIDS: rare    FAMILY HISTORY:  No FH of gastrointestinal diseases or malignancies.  Mother with "spastic colon"     PHYSICAL EXAMINATION:  Vitals:    04/25/16 1521   BP: 147/68   BP Cuff Size: Regular   BP Site: Left Arm   BP Position: Sitting   Pulse: 56   Temp: 96.3 F (35.7 C)   Weight: 120 lb (54.4 kg)   Height: 5\' 1"  (1.549 m)   GEN: well-kept, well-developed, normal weight, NAD  HEENT: anicteric, atraumatic, MMM  NECK: supple  CHEST: breathing comfortably  ABD: soft  EXTR: no LE edema bilat  SKIN: no rashes  NEURO: A&Ox3, MAEE, nl gait    LABS:  Results for orders placed or performed in visit on 09/07/15   WET PREP/KOH/TEST, ONSITE   Result Value Ref Range    Odor ABSENT     WBC 1+     RBC, Wet Prep NEG     Bacteria NEG     Clue Cells 3+ (A)     Yeast, Wet Prep NEG     Epithelial Cells, Wet Prep 3+     Trich NEG     Other, Wet Prep NEG        RADIOLOGY:  Please refer to HPI    ASSESSMENT/PLAN:  76 year old  female history of uterine prolapse s/p hysterectomy and urge incontinence presenting with ongoing issues of postprandial bloating/distention and abd pain, early satiety and severe nausea, possibly aggravated with constipation but having alternating bowel habits.  MR defecography  notable for severe perineal descent syndrome.  Unfortunately no response to PT but sounds like she only had abdominal massage therapy and no biofeedback. EGD and colonoscopy only notable for severe diverticulosis.  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  DDx also includes gastroparesis versus dumping syndrome versus slow-transit constipation versus delayed small bowel dysmotility versus small intestinal bacterial overgrowth.    -Re-referral to PT for biofeedback  -Trial of tap water enemas  -For nausea, trial of ranitidine.  -Wireless motility capsule  -OK to resume coke beverage as this helps alleviate her symptoms    RTC in 12 weeks or sooner as needed    Halford Chessman, MD  Acting Assistant Professor  Division of Gastroenterology  [employer] [facility]  Pager [phone]

## 2016-04-25 NOTE — Telephone Encounter (Signed)
Per Bea Laura pt is returning her CPAP due to it is not working. please advise.

## 2016-04-26 ENCOUNTER — Telehealth (HOSPITAL_BASED_OUTPATIENT_CLINIC_OR_DEPARTMENT_OTHER): Payer: Self-pay | Admitting: Gastroenterology

## 2016-04-26 ENCOUNTER — Telehealth (HOSPITAL_BASED_OUTPATIENT_CLINIC_OR_DEPARTMENT_OTHER): Payer: Self-pay

## 2016-04-26 DIAGNOSIS — R110 Nausea: Secondary | ICD-10-CM

## 2016-04-26 MED ORDER — RANITIDINE HCL 150 MG OR TABS
300.0000 mg | ORAL_TABLET | Freq: Every evening | ORAL | 4 refills | Status: DC
Start: 2016-04-26 — End: 2016-07-19

## 2016-04-26 NOTE — Addendum Note (Signed)
Addended by: Ward Givens on: 04/26/2016 05:30 PM     Modules accepted: Orders

## 2016-04-26 NOTE — Addendum Note (Signed)
Addended by: Floy Sabina on: 04/26/2016 02:22 PM     Modules accepted: Orders

## 2016-04-26 NOTE — Telephone Encounter (Signed)
Vinnie Level with [facility] [phone] to clarify quantity and sig - "take 2 at bedtime" of Ranitidine for Dr. Domenic Polite

## 2016-04-26 NOTE — Telephone Encounter (Signed)
Hi there,    We have routed a centralized colonoscopy referral to your queue.     The referral team does not schedule these types of appointments. Please keep in mind the referral team only schedules screening colonoscopy/EGD/flex sig procedures.    Please call patient to schedule.     Thanks!  Antony Haste  Referral Team, [phone]  [place]         **PLEASE NOTE**    When replying to this message,   You must CC the following pool:     [place]     Or the referral team will not receive the message

## 2016-04-27 ENCOUNTER — Other Ambulatory Visit (INDEPENDENT_AMBULATORY_CARE_PROVIDER_SITE_OTHER): Payer: Self-pay | Admitting: Internal Medicine

## 2016-04-27 DIAGNOSIS — R799 Abnormal finding of blood chemistry, unspecified: Secondary | ICD-10-CM

## 2016-04-27 DIAGNOSIS — I739 Peripheral vascular disease, unspecified: Secondary | ICD-10-CM

## 2016-04-28 NOTE — Telephone Encounter (Signed)
Via called regarding referral. This test is not preformed there.

## 2016-05-03 NOTE — Telephone Encounter (Signed)
Routing to [facility] Care team B [facility] Pool to follow up  CCR-DO NOT SCHEDULE:  Warm transfer (VM OK) to Team B [phone]

## 2016-05-04 ENCOUNTER — Ambulatory Visit (HOSPITAL_BASED_OUTPATIENT_CLINIC_OR_DEPARTMENT_OTHER): Payer: Medicare PPO

## 2016-05-05 ENCOUNTER — Ambulatory Visit: Payer: Medicare PPO | Attending: Internal Medicine

## 2016-05-05 DIAGNOSIS — I739 Peripheral vascular disease, unspecified: Secondary | ICD-10-CM | POA: Insufficient documentation

## 2016-05-06 ENCOUNTER — Encounter (INDEPENDENT_AMBULATORY_CARE_PROVIDER_SITE_OTHER): Payer: Self-pay | Admitting: Internal Medicine

## 2016-05-06 DIAGNOSIS — R936 Abnormal findings on diagnostic imaging of limbs: Secondary | ICD-10-CM

## 2016-05-06 DIAGNOSIS — I10 Essential (primary) hypertension: Secondary | ICD-10-CM

## 2016-05-11 ENCOUNTER — Ambulatory Visit (INDEPENDENT_AMBULATORY_CARE_PROVIDER_SITE_OTHER): Payer: Medicare PPO | Admitting: Internal Medicine

## 2016-05-11 VITALS — BP 158/84 | HR 68 | Temp 97.9°F | Wt 122.0 lb

## 2016-05-11 DIAGNOSIS — Z6823 Body mass index (BMI) 23.0-23.9, adult: Secondary | ICD-10-CM

## 2016-05-11 DIAGNOSIS — G4733 Obstructive sleep apnea (adult) (pediatric): Secondary | ICD-10-CM

## 2016-05-11 DIAGNOSIS — G4700 Insomnia, unspecified: Secondary | ICD-10-CM

## 2016-05-11 NOTE — Progress Notes (Signed)
Robin Hart  Chief Complaint   Patient presents with   . Sleep Problem     HPI: She has mild apnea, did not feel CPAP would work and returned it. She goes to bed at 10 wakes up for a few hours and than goes back to sleep. Awakes 7AM, w/o alarm clock. Some nights feels she does not sleep. She has same schedule weekend and weekday. No EtOH or drugs, 1 AM soda. She does not feel depressed, not on antidepressants. She reports no RLS, parasomnia or nightmares. She has quiet, comfortable sleep environment. She wants non-CPAP apnea therapy. 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 Wants treatment for insomnia-wonders if sleep pills are appropriate.   Past Medical History:   Diagnosis Date   . Arthritis    . Cancer ([place])     skin cell carcinoma    . Constipation    . Diverticulosis    . Glaucoma    . H. pylori infection    . Headache    . History of diarrhea    . History of irritable bowel syndrome    . Hypertension    . Lipidemia    . Osteoporosis      Current Outpatient Prescriptions   Medication Sig Dispense Refill   . ACETAMINOPHEN-CODEINE #4 OR Cough syrup     . Acetaminophen-Codeine 120-12 MG/5ML Oral Solution take 5 milliliter by mouth every 6 hours if needed for cough 118 mL 0   . Hydrocortisone 1 % External Cream Apply to ears BID prn itchy ears 1 Tube 2   . Latanoprost 0.005 % Ophthalmic Solution Place 1 drop in each EYE at bedtime.  1   . RaNITidine HCl 150 MG Oral Tab Take 2 tablets (300 mg) by mouth at bedtime. 60 tablet 4   . Timolol Maleate 0.5 % Ophthalmic GEL FORMING SOLUTION Place 1 drop in each EYE every morning.  1     No current facility-administered medications for this visit.      Social History     Social History   . Marital status: N/A     Spouse name: N/A   . Number of children: N/A   . Years of education: N/A     Occupational History   . Not on file.     Social History Main Topics   . Smoking status: Never Smoker   . Smokeless tobacco: Never Used   . Alcohol use No   .  Drug use: No   . Sexual activity: Not on file     Other Topics Concern   . Not on file     Social History Narrative   . No narrative on file     Review of patient's allergies indicates:  Allergies   Allergen Reactions   . Caffeine      Other reaction(s): Nausea Only  Extreme nervousness      Past Surgical History:   Procedure Laterality Date   . Basal cell carcinoma excision     . BLADDER SUSPENSION     . BREAST BIOPSY     . COLONOSCOPY STOMA DX INCLUDING COLLJ SPEC SPX     . TOTAL ABDOMINAL HYSTERECT W/WO RMVL TUBE OVARY       Patient Active Problem List   Diagnosis   . Hypertension   . Irritable bowel syndrome with diarrhea   . Sacroiliac joint dysfunction   . Osteoporosis   . OAB (overactive bladder)   . Primary insomnia   .  Pelvic floor dysfunction   . Chronic idiopathic constipation   . Nausea     ROS:  CONST: fatigue    EXAM  Vitals:    05/11/16 1342   BP: 158/84   BP Cuff Size: Regular   BP Site: Left Arm   BP Position: Sitting   Pulse: 68   Temp: 97.9 F (36.6 C)   TempSrc: Temporal   SpO2: 100%   Weight: 122 lb (55.3 kg)       1. Obstructive sleep apnea  Discussed. Apnea mild, refer oral appliance. Advised her to make sure insurance covers and she discusses history of possible mild TMJ. Reminded her to never drive if droesy  - REFERRAL TO ORAL MED/DENTISTRY    2. Insomnia, unspecified type  Discussed. See above. Very mild apnea. Recent normal TSH. Refer to CBT, advised to never drive if drowsy. Advised why CBT is better than sleep meds  - REFFERAL TO SLEEP LAB    Follow up appointment in 3 months. Will return sooner or ER if worsens.

## 2016-05-11 NOTE — Patient Instructions (Signed)
It was a pleasure to see you in clinic today.                If you are not yet signed up for eCare, please see the below eCare section at the end of this document for how to enroll and to get your access codes.  It's easy to sign up at home today.    eCare  enrollment will allow you access to the below benefits   You can make appointments online   View test results / Lab Results   Request prescription renewals   Obtain a copy of our After Visit Summary (an electronic copy of this document)     Your Test Results:  If labs were ordered today the results are expected to be available via eCare in about 5 days. If you have an active eCare account, this is how we will notify you of your results.     If you do not have an eCare account then your test results will be mailed to you within about 14 days after your tests are completed. If your physician needs to change your care based on your results or is concerned, you should expect a phone call or eCare message from your provider.    If you have any questions about your test results please schedule an appointment with your provider, so that we may review them with you in greater detail.    **If it has been more than 2 weeks and you have not received your test results please send our office a message via eCare.    Medication Refills: If you need a prescription refilled, please contact your pharmacy 1 week before your current supply will run out to request the refill.  Contacting your pharmacy is the fastest and safest way to obtain a medication refill.  The pharmacy will notify our office.  Please note, that a minimum of 48 to 72 hours is needed to refill a medication,  Please call your pharmacy early to allow enough time to refill before you anticipate running out.  For faster medication refills, you can also schedule an appointment with your provider.    We know you have a choice in where you receive your healthcare and we sincerely thank you for trusting [facility] with your health.

## 2016-05-13 ENCOUNTER — Ambulatory Visit: Payer: Medicare PPO | Attending: Internal Medicine

## 2016-05-13 ENCOUNTER — Other Ambulatory Visit: Payer: Self-pay | Admitting: Internal Medicine

## 2016-05-13 DIAGNOSIS — Z1231 Encounter for screening mammogram for malignant neoplasm of breast: Secondary | ICD-10-CM | POA: Insufficient documentation

## 2016-05-20 ENCOUNTER — Ambulatory Visit (HOSPITAL_BASED_OUTPATIENT_CLINIC_OR_DEPARTMENT_OTHER): Payer: Medicare PPO | Attending: Clinical | Admitting: Clinical

## 2016-05-20 DIAGNOSIS — F5104 Psychophysiologic insomnia: Secondary | ICD-10-CM | POA: Insufficient documentation

## 2016-05-20 NOTE — Patient Instructions (Signed)
It was good meeting you today. Please complete the Sleep Diary every day between now and your next visit with me, and bring it in with you to our next session. Try to avoid taking naps longer than 20 minutes.

## 2016-05-20 NOTE — Progress Notes (Signed)
[facility] OUTPATIENT PROVIDER NOTE      DATE: 05/20/2016  TYPE OF SERVICE: Individual Psychotherapy Session    # OF MINUTES: 25 minutes    PROBLEM/INTERVAL HISTORY:     ID/CC: The patient is a 76 year old woman referred for the treatment of insomnia by Ozzie Hoyle, MD. The patient and I met today (05/20/2016) for 25 minutes.    HPI: The patient has OSA for which she was attempting to use CPAP (Amara View mask), but was unable to get used to it and felt it was making her insomnia worse. Other medical problems include IBS, hypertension, and osteoporosis.    In terms of her insomnia, she has had problems since the late 1980's. She doesn't take anything for blood pressure because she wants to get a good night's sleep first. She gets out of bed at at variable times times - - last night she got out of bed at 4:00 am; she usually gets out of bed at 8:00 am. She naps whenever she gets the chance to. She doesn't drink alcohol because it hurts her gut. She usually gets in bed at 11:00 pm. During the day she is very bust - babysitting her grandchildren, in book clubs, in a social group, in a knitting group, a gourmet group. She was a Glass blower/designer, in nutrition, and administation. She sleeps for 2.5 hours, then will be awake for 30 minutes to 4 hours, and will stay in bed for several hours before finally falling back to sleep.     Pertinent Past Medical/Psych or Social History:   Past Medical History:   Diagnosis Date   . Arthritis    . Cancer ([place])     skin cell carcinoma    . Constipation    . Diverticulosis    . Glaucoma    . H. pylori infection    . Headache    . History of diarrhea    . History of irritable bowel syndrome    . Hypertension    . Lipidemia    . Osteoporosis      Social History     Social History   . Marital status: N/A     Spouse name: N/A   . Number of children: N/A   . Years of education: N/A     Occupational History   . Not on file.     Social History Main Topics   . Smoking status:  Never Smoker   . Smokeless tobacco: Never Used   . Alcohol use No   . Drug use: No   . Sexual activity: Not on file     Other Topics Concern   . Not on file     Social History Narrative   . No narrative on file         MENTAL STATUS  EXAMINATION:  General appearance: Casually dressed and well groomed  Behavior/activity: Appropriate to setting and situation  Speech: Somewhat low in volume, but normal in rate, coherence, and articulation  Affect: Bright, pleasant, and appropriate  Mood: Good  Thought Form: Logical and linear with intact associations  Thought Content: No evidence of intent to harm self or others  Orientation: wnl  Attention/concentration: wnl  Memory: wnl  Insight and judgment: Excellent    ASSESSMENT: A 76 year old woman presenting for the treatment of insomnia. She has fragmented sleep and will likely benefit from working toward sleep consolidation.    PHQ-9 Total Score:5/somewhat difficult on 05/20/2016 (minimal depression)  0-4 None  5-9  Minimal depression  10-14 Mild depression  15-19 Moderately severe depression  > or = 20 Severe Depression    GAD-7 Total Score: 2 on 05/20/2016  0-4 Mild anxiety  5-9 Moderate anxiety  10-15 Severe Anxiety  > or = 16 Possible Generalized Anxiety Disorder    Insomnia Severity Index score18X out of 28 on 05/20/2016, suggesting moderate insomnia.  0-7 no clinically significant insomnia  8-14 subthreshold insomnia  15-21 moderate insomnia  22-28 severe insomnia     Diagnoses: Insomnia, psychophysiological    Treatment/Intervention:  I provided 25 minutes of individual psychotherapy, during which I gained a 24-hour picture of the patient's insomnia and behaviors that may perpetuate sleep difficulties. I instructed the patient in the use of a Sleep Log, including providing the rationale for its use, having the patient complete a log for today, and discussing possible barriers to self monitoring sleep. I encouraged her to avoid taking long naps during the day. She appeared  to appreciate today's session.    Plan: Meet again in two weeks.

## 2016-05-20 NOTE — Progress Notes (Deleted)
[facility] OUTPATIENT PROVIDER NOTE      DATE: 05/20/2016  TYPE OF SERVICE: Individual Psychotherapy Session    # OF MINUTES: 55 minutes    PROBLEM/INTERVAL HISTORY:     ID/CC: The patient is a 76 year old woman referred for the treatment of insomnia by Ozzie Hoyle, MD. The patient and I met today (05/20/2016) for 55 minutes.    HPI: The patient has OSA for which she was attempting to use CPAP (Amara View mask), but was unable to get used to it and felt it was making her insomnia worse. Other medical problems include IBS, hypertension, and osteoporosis.    Pertinent Past Medical/Psych or Social History:   Past Medical History:   Diagnosis Date   . Arthritis    . Cancer ([place])     skin cell carcinoma    . Constipation    . Diverticulosis    . Glaucoma    . H. pylori infection    . Headache    . History of diarrhea    . History of irritable bowel syndrome    . Hypertension    . Lipidemia    . Osteoporosis      Social History     Social History   . Marital status: N/A     Spouse name: N/A   . Number of children: N/A   . Years of education: N/A     Occupational History   . Not on file.     Social History Main Topics   . Smoking status: Never Smoker   . Smokeless tobacco: Never Used   . Alcohol use No   . Drug use: No   . Sexual activity: Not on file     Other Topics Concern   . Not on file     Social History Narrative   . No narrative on file         MENTAL STATUS  EXAMINATION:  General appearance: {Appearance:500154007}  Behavior/activity: {Behavior:500154008}  Speech: {Speech:500154012}  Affect: {Affect:500154011}  Mood: {CBSW:967591638}  Thought Form: {Thought Process and Content:500154025}  Thought Content: {Thought Content:500154089}  Orientation: {Orientation:500154023}  Attention/concentration: {Attention & Concentration:500154009}  Memory: {Memory:105180}  Insight and judgment: {Insight/Judgement:105181}    ASSESSMENT: ***  PHQ-9 Total Score:  0-4 None  5-9 Minimal depression  10-14 Mild  depression  15-19 Moderately severe depression  > or = 20 Severe Depression    GAD-7 Total Score:   0-4 Mild anxiety  5-9 Moderate anxiety  10-15 Severe Anxiety  > or = 16 Possible Generalized Anxiety Disorder    Insomnia Severity Index score XX out of 28 on 05/20/2016, suggesting XXX insomnia.  0-7 no clinically significant insomnia  8-14 subthreshold insomnia  15-21 moderate insomnia  22-28 severe insomnia    Diagnoses: Insomnia, psychophysiological  Treatment/Intervention:  I provided 55 minutes of individual psychotherapy, during which I gained a 24-hour picture of the patient's insomnia and behaviors that may perpetuate sleep difficulties. I instructed the patient in the use of a Sleep Log, including providing the rationale for its use, having the patient complete a log for today, and discussing possible barriers to self monitoring sleep.

## 2016-05-25 ENCOUNTER — Encounter (INDEPENDENT_AMBULATORY_CARE_PROVIDER_SITE_OTHER): Payer: Self-pay | Admitting: Internal Medicine

## 2016-05-26 ENCOUNTER — Encounter (HOSPITAL_BASED_OUTPATIENT_CLINIC_OR_DEPARTMENT_OTHER): Payer: Medicare PPO

## 2016-06-03 ENCOUNTER — Ambulatory Visit (HOSPITAL_BASED_OUTPATIENT_CLINIC_OR_DEPARTMENT_OTHER): Payer: Medicare PPO | Attending: Clinical | Admitting: Clinical

## 2016-06-03 DIAGNOSIS — F5104 Psychophysiologic insomnia: Secondary | ICD-10-CM | POA: Insufficient documentation

## 2016-06-03 NOTE — Patient Instructions (Addendum)
It was nice seeing you again today. Thank you for bringing in your completed Sleep Diary.    Today we are switching to the Sleep Log. To start, plan on getting in bed at 11:30 pm, and getting out of bed at 6:00 am. Keep your naps to a minimum in both duration and frequency.     As your SE improves to 85% or better, start adding additional TIB time - in 30 minute increments. Add additional TIB after fairly consistent improved SE over 3-4 nights.    Here is how to do the calculations:     Time-in-bed (TIB) = the amount of elapsed time in minutes between when you get in bed for the night (first time) and when you get out of bed for the day.   Total sleep time = TIB minus any awake time after getting into bed for the first time of the night. By awake time, this includes: the time between getting in bed and turning off the light to try to sleep, the amount of time it takes to sleep once you try to go to sleep, the amount of time of each awakening, the amount of time you are out of bed because you couldn't sleep, the amount of time you lay around in bed before physically getting out of bed after that final awakening.   Sleep efficiency (SE) = TST/TIB. (If your SE is less than 85%, you need TIB restriction). Once it is above 85%, you can try giving yourself a little more TIB each night (e.g., 30-minutes increments). You've hit your ideal TIB when you've added time and SE starts to go down again. The addition of increments of additional TIB can bump up about once a week.     IMPORTANT: Do not drop your TIB below 5 hours/night (in your case, hopefully not below 6.5 hours). Also, as you do this, be careful to avoid driving while drowsy. In fact, never drive while drowsy!

## 2016-06-03 NOTE — Progress Notes (Signed)
[facility] OUTPATIENT PROVIDER NOTE      DATE: 06/03/2016  TYPE OF SERVICE: Individual Psychotherapy Session    # OF MINUTES: 55 minutes    PROBLEM/INTERVAL HISTORY:     ID/CC: The patient is a 76 year old woman referred for the treatment of insomnia by Ozzie Hoyle, MD. The patient and I met today (06/03/2016) for 55 minutes. Today is our second session, having last met on 05/20/2016.    HPI: The patient has OSA for which she was attempting to use CPAP (Amara View mask), but was unable to get used to it and felt it was making her insomnia worse. Other medical problems include IBS, hypertension, and osteoporosis.    In terms of her insomnia, she has had problems since the late 1980's. She doesn't take anything for blood pressure because she wants to get a good night's sleep first. She gets out of bed at at variable times times - - last night she got out of bed at 4:00 am; she usually gets out of bed at 8:00 am. She naps whenever she gets the chance to. She doesn't drink alcohol because it hurts her gut. She usually gets in bed at 11:00 pm. During the day she is very busy - babysitting her grandchildren, in book clubs, in a social group, in a knitting group, a gourmet group. She was a Glass blower/designer, in nutrition, and administation. She sleeps for 2.5 hours, then will be awake for 30 minutes to 4 hours, and will stay in bed for several hours before finally falling back to sleep.      Interval History (05/20/2016-06/03/2016): Robin Hart had a cold during much of the time since our last visit. However, over the past 3 nights she was able to limit her time in bed to between the hours of 11 pm and 6 am for the most part. Prior to that, she had very broken sleep with lengthy awakenings during the night.    Pertinent Past Medical/Psych or Social History:   Past Medical History:   Diagnosis Date   . Arthritis    . Cancer ([place])     skin cell carcinoma    . Constipation    . Diverticulosis    . Glaucoma    . H.  pylori infection    . Headache    . History of diarrhea    . History of irritable bowel syndrome    . Hypertension    . Lipidemia    . Osteoporosis      Social History     Social History   . Marital status: N/A     Spouse name: N/A   . Number of children: N/A   . Years of education: N/A     Occupational History   . Not on file.     Social History Main Topics   . Smoking status: Never Smoker   . Smokeless tobacco: Never Used   . Alcohol use No   . Drug use: No   . Sexual activity: Not on file     Other Topics Concern   . Not on file     Social History Narrative   . No narrative on file         MENTAL STATUS  EXAMINATION:  General appearance: Casually dressed and well groomed, arriving early for her appointment  Behavior/activity: Appropriate to setting and situation  Speech: Normal in rate, volume, coherence, and articulation  Affect: Bright, pleasant, and appropriate  Mood: Good  Thought Form:  Logical and linear with intact associations  Thought Content: No evidence of intent to harm self or others  Orientation: wnl  Attention/concentration: wnl  Memory: wnl  Insight and judgment: Excellent    ASSESSMENT: A 76 year old woman presenting for the treatment of insomnia. She has fragmented sleep and will likely benefit from working toward sleep consolidation. Based on her completed Sleep Diary from the past two weeks, she will likely be able to achieve more consolidated sleep by limiting her time in bed to 11:30 pm - 6 am, and then gradually add additional time in bed to reach her personal optimum. She is taking power naps of about 15 minutes each, around 2-3 times a day, and should try to limit these.    PHQ-9 Total Score:5/somewhat difficult on 05/20/2016 (minimal depression)  0-4 None  5-9 Minimal depression  10-14 Mild depression  15-19 Moderately severe depression  > or = 20 Severe Depression    GAD-7 Total Score: 2 on 05/20/2016  0-4 Mild anxiety  5-9 Moderate anxiety  10-15 Severe Anxiety  > or = 16 Possible  Generalized Anxiety Disorder    Insomnia Severity Index score 18 out of 28 on 05/20/2016, suggesting moderate insomnia.  0-7 no clinically significant insomnia  8-14 subthreshold insomnia  15-21 moderate insomnia  22-28 severe insomnia     Diagnoses: Insomnia, psychophysiological    Treatment/Intervention:  I provided 55 minutes of individual psychotherapy, using the time to review her sleep diary and experience with slepe since we last met, describing the rational for TIB restriction, and recommending getting into bed at 11:30 and out of bed at 6:00 am to start (6.5 hours TIB). I discussed the three pillars of improving her sleep - - increasing sleep drive through TIB restriction, stimulus control, and matching circadian cues (light, temperature). I instructed her in how to compute her sleep efficiency so she can track her progress daily and add additional time in bed as her SE improves. She appeared to appreciate today's visit.    Plan: Meet again in three weeks.

## 2016-06-06 ENCOUNTER — Encounter (HOSPITAL_BASED_OUTPATIENT_CLINIC_OR_DEPARTMENT_OTHER): Payer: Self-pay | Admitting: Clinical

## 2016-06-16 ENCOUNTER — Ambulatory Visit: Payer: Medicare PPO | Attending: Gastroenterology

## 2016-06-16 DIAGNOSIS — R1084 Generalized abdominal pain: Secondary | ICD-10-CM | POA: Insufficient documentation

## 2016-06-16 DIAGNOSIS — R140 Abdominal distension (gaseous): Secondary | ICD-10-CM | POA: Insufficient documentation

## 2016-06-16 DIAGNOSIS — R6881 Early satiety: Secondary | ICD-10-CM | POA: Insufficient documentation

## 2016-06-16 DIAGNOSIS — R110 Nausea: Secondary | ICD-10-CM

## 2016-06-17 ENCOUNTER — Telehealth (HOSPITAL_BASED_OUTPATIENT_CLINIC_OR_DEPARTMENT_OTHER): Payer: Self-pay

## 2016-06-17 NOTE — Telephone Encounter (Signed)
Patient called to see if she can bring the SmartPill here Friday instead of Tuesday. I reviewed with ehr that she should bring it Tuesday. Patient agrees to this plan.

## 2016-06-18 ENCOUNTER — Ambulatory Visit: Payer: Self-pay

## 2016-06-18 NOTE — Telephone Encounter (Signed)
Reason for Disposition  . Caller has NON-URGENT medication question about med that PCP prescribed and triager unable to answer question    Protocols used: MEDICATION QUESTION CALL-ADULT-[facility]  Swallowed Smart Pill 06/16/2016.  Passed the pill on 06/18/2016 at 1500 and has saved this in a ziplock bag.  Wonders if she can stop wearing the recorder.  She will stop wearing the recorder and contact clinic Monday AM.

## 2016-06-18 NOTE — Telephone Encounter (Addendum)
Swallowed a smart pill and given a recorder to wear until 2022/07/27.  She has passed the pill on 2022-07-24 at 1500 today and wonders  If she can stop wearing the recorder.

## 2016-06-20 NOTE — Telephone Encounter (Signed)
Spoke with [employer]. Explained she could throw the smart pill away, but she did need to bring the recorder back to be processed and images uploaded from it. Pt states she will bring it in on Tuesday 06/21/16.  Pt had no other concerns or questions at this time.

## 2016-06-22 ENCOUNTER — Telehealth (HOSPITAL_BASED_OUTPATIENT_CLINIC_OR_DEPARTMENT_OTHER): Payer: Self-pay

## 2016-06-22 NOTE — Telephone Encounter (Signed)
Pt here to return smart pill recorder & events diary.  Pt aware she should contact this office of any issues related to procedure & when the pill has been expelled.  Study downloaded & ready for review; Dr. Domenic Polite notified.

## 2016-06-24 ENCOUNTER — Ambulatory Visit (HOSPITAL_BASED_OUTPATIENT_CLINIC_OR_DEPARTMENT_OTHER): Payer: Medicare PPO | Attending: Clinical | Admitting: Clinical

## 2016-06-24 ENCOUNTER — Encounter (HOSPITAL_BASED_OUTPATIENT_CLINIC_OR_DEPARTMENT_OTHER): Payer: Self-pay | Admitting: Gastroenterology

## 2016-06-24 DIAGNOSIS — F5104 Psychophysiologic insomnia: Secondary | ICD-10-CM | POA: Insufficient documentation

## 2016-06-24 NOTE — Patient Instructions (Signed)
It was good seeing you again today. This coming week, plan on going to bed at 11:30 and getting out of bed by 6:30. Hopefully the additional half hour of sleep will be helpful.    We also talked about keeping a notebook in the evening in which you review your plans for the following day in writing. Do this sufficiently early in the evening so that you still have time to properly unwind before bed. Keep the notebook by your bed so that if you think of something you want to add, you can. This replaces having to mentally "rehearse" adding the additional item(s) the next morning.    Today we practiced another mental strategy for handling thoughts at night. Give it a try!

## 2016-06-24 NOTE — Progress Notes (Signed)
[facility] OUTPATIENT PROVIDER NOTE      DATE: 06/03/2016  TYPE OF SERVICE: Individual Psychotherapy Session    # OF MINUTES: 45 minutes    PROBLEM/INTERVAL HISTORY:     ID/CC: The patient is a 76 year old woman referred for the treatment of insomnia by Ozzie Hoyle, MD. The patient and I met today (06/03/2016) for 45 minutes. Today is our second session, having last met on 05/20/2016.    HPI: The patient has OSA for which she was attempting to use CPAP (Amara View mask), but was unable to get used to it and felt it was making her insomnia worse. Other medical problems include IBS, hypertension, and osteoporosis.    In terms of her insomnia, she has had problems since the late 1980's. She doesn't take anything for blood pressure because she wants to get a good night's sleep first. She gets out of bed at at variable times times - - last night she got out of bed at 4:00 am; she usually gets out of bed at 8:00 am. She naps whenever she gets the chance to. She doesn't drink alcohol because it hurts her gut. She usually gets in bed at 11:00 pm. During the day she is very busy - babysitting her grandchildren, in book clubs, in a social group, in a knitting group, a gourmet group. She was a Glass blower/designer, in nutrition, and administation. She sleeps for 2.5 hours, then will be awake for 30 minutes to 4 hours, and will stay in bed for several hours before finally falling back to sleep.      Interval History (05/20/2016-06/03/2016): Robin Hart had a cold during much of the time since our last visit. However, over the past 3 nights she was able to limit her time in bed to between the hours of 11 pm and 6 am for the most part. Prior to that, she had very broken sleep with lengthy awakenings during the night.    Interval History (06/03/2016-06/24/2016): Robin Hart feels her sleep is getting a bit better. Her sleep efficiency is gradually improving, and she is less likely to have lengthy awakenings during the night now  compared with previously. She still feels sleepy during the day, however. She tends to be awake thinking about things she will be doing the following day. She found it difficult to use the mindfulness strategy taught at our last session, however.    Pertinent Past Medical/Psych or Social History:   Past Medical History:   Diagnosis Date   . Arthritis    . Cancer ([place])     skin cell carcinoma    . Constipation    . Diverticulosis    . Glaucoma    . H. pylori infection    . Headache    . History of diarrhea    . History of irritable bowel syndrome    . Hypertension    . Lipidemia    . Osteoporosis      Social History     Social History   . Marital status: N/A     Spouse name: N/A   . Number of children: N/A   . Years of education: N/A     Occupational History   . Not on file.     Social History Main Topics   . Smoking status: Never Smoker   . Smokeless tobacco: Never Used   . Alcohol use No   . Drug use: No   . Sexual activity: Not on file     Other  Topics Concern   . Not on file     Social History Narrative   . No narrative on file         MENTAL STATUS  EXAMINATION:  General appearance: Casually dressed and well groomed, arriving 15 minutes late for her appointment  Behavior/activity: Appropriate to setting and situation. Brings in her completed Sleep Logs.  Speech: Normal in rate, volume, coherence, and articulation  Affect: Bright, pleasant, and appropriate  Mood: Good  Thought Form: Logical and linear with intact associations  Thought Content: No evidence of intent to harm self or others  Orientation: wnl  Attention/concentration: wnl  Memory: wnl  Insight and judgment: Excellent    ASSESSMENT: A 76 year old woman presenting for the treatment of insomnia. She has fragmented sleep but this is improving. She may benefit from adding an additional 30 minutes of time in bed at this point, as she is experiencing daytime sleepiness. We also discussed strategies for addressing sleep-interfering cognitions during today's  session.    PHQ-9 Total Score:5/somewhat difficult on 05/20/2016 (minimal depression)  0-4 None  5-9 Minimal depression  10-14 Mild depression  15-19 Moderately severe depression  > or = 20 Severe Depression    GAD-7 Total Score: 2 on 05/20/2016  0-4 Mild anxiety  5-9 Moderate anxiety  10-15 Severe Anxiety  > or = 16 Possible Generalized Anxiety Disorder    Insomnia Severity Index score 18 out of 28 on 05/20/2016, suggesting moderate insomnia.  0-7 no clinically significant insomnia  8-14 subthreshold insomnia  15-21 moderate insomnia  22-28 severe insomnia     Diagnoses: Insomnia, psychophysiological    Treatment/Intervention:  I provided 45 minutes of individual psychotherapy, using the time to review her sleep diaries and set a new time-in-bed restriction. We reviewed strategies for diminishing her sleep-interfering cognitions. She appeared to appreciate today's session.    Plan: Meet again in three weeks.

## 2016-07-15 ENCOUNTER — Ambulatory Visit (HOSPITAL_BASED_OUTPATIENT_CLINIC_OR_DEPARTMENT_OTHER): Payer: Medicare PPO | Attending: Clinical | Admitting: Clinical

## 2016-07-15 DIAGNOSIS — F5104 Psychophysiologic insomnia: Secondary | ICD-10-CM | POA: Insufficient documentation

## 2016-07-15 NOTE — Progress Notes (Signed)
SLEEP CENTER OUTPATIENT PROVIDER NOTE      DATE: 07/15/2016  TYPE OF SERVICE: Individual Psychotherapy Session    # OF MINUTES: 35 minutes    PROBLEM/INTERVAL HISTORY:     ID/CC: The patient is a 76 year old woman referred for the treatment of insomnia by Ozzie Hoyle, MD. The patient and I met today (07/15/2016) for 35 minutes. Today is our fourth session, having last met on 06/03/2016.    HPI: The patient has OSA for which she was attempting to use CPAP ([employer] mask), but was unable to get used to it and felt it was making her insomnia worse. Other medical problems include IBS, hypertension, and osteoporosis.    In terms of her insomnia, she has had problems since the late 1980's. She doesn't take anything for blood pressure because she wants to get a good night's sleep first. She gets out of bed at at variable times times - - last night she got out of bed at 4:00 am; she usually gets out of bed at 8:00 am. She naps whenever she gets the chance to. She doesn't drink alcohol because it hurts her gut. She usually gets in bed at 11:00 pm. During the day she is very busy - babysitting her grandchildren, in book clubs, in a social group, in a knitting group, a gourmet group. She was a Glass blower/designer, in nutrition, and administation. She sleeps for 2.5 hours, then will be awake for 30 minutes to 4 hours, and will stay in bed for several hours before finally falling back to sleep.      Interval History (05/20/2016-06/03/2016): Ainara had a cold during much of the time since our last visit. However, over the past 3 nights she was able to limit her time in bed to between the hours of 11 pm and 6 am for the most part. Prior to that, she had very broken sleep with lengthy awakenings during the night.    Interval History (06/03/2016-06/24/2016): Veyda feels her sleep is getting a bit better. Her sleep efficiency is gradually improving, and she is less likely to have lengthy awakenings during the night now  compared with previously. She still feels sleepy during the day, however. She tends to be awake thinking about things she will be doing the following day. She found it difficult to use the mindfulness strategy taught at our last session, however.    Interval History (06/24/2016-07/15/2016): Jaleen is finding that she has trouble with sleep continuity about once a week now. The cognitive and mindfulness startegies we have tried have not enabled her to fall back to sleep quickly on the nights this happens. For the remainder, her sleep efficiency is better overall and shows significant improvement.     Pertinent Past Medical/Psych or Social History:   Past Medical History:   Diagnosis Date    Arthritis     Cancer ([place])     skin cell carcinoma     Constipation     Diverticulosis     Glaucoma     H. pylori infection     Headache     History of diarrhea     History of irritable bowel syndrome     Hypertension     Lipidemia     Osteoporosis      Social History     Social History    Marital status: N/A     Spouse name: N/A    Number of children: N/A    Years of education: N/A  Occupational History    Not on file.     Social History Main Topics    Smoking status: Never Smoker    Smokeless tobacco: Never Used    Alcohol use No    Drug use: No    Sexual activity: Not on file     Other Topics Concern    Not on file     Social History Narrative    No narrative on file         MENTAL STATUS  EXAMINATION:  General appearance: Casually dressed and well groomed, arriving 20 minutes late for her appointment  Behavior/activity: Appropriate to setting and situation. Brings in her completed Sleep Logs from the past three weeks.  Speech: Normal in rate, volume, coherence, and articulation  Affect: Bright, pleasant, and appropriate  Mood: Good  Thought Form: Logical and linear with intact associations  Thought Content: No evidence of intent to harm self or others  Orientation: wnl  Attention/concentration:  wnl  Memory: wnl  Insight and judgment: Excellent    ASSESSMENT: A 76 year old woman presenting for the treatment of insomnia.Her fragmented sleep has improved to the point where she is only experiencing it to a significant degree about once a week. Today we discussed several additional stimulus-control based strategies she can use to address this, taking an empirical approach by continuing to keep sleep records on her own and note which strategies are most effective.    PHQ-9 Total Score:5/somewhat difficult on 05/20/2016 (minimal depression)  0-4 None  5-9 Minimal depression  10-14 Mild depression  15-19 Moderately severe depression  > or = 20 Severe Depression    GAD-7 Total Score: 2 on 05/20/2016  0-4 Mild anxiety  5-9 Moderate anxiety  10-15 Severe Anxiety  > or = 16 Possible Generalized Anxiety Disorder    Insomnia Severity Index score 18 out of 28 on 05/20/2016, suggesting moderate insomnia.  0-7 no clinically significant insomnia  8-14 subthreshold insomnia  15-21 moderate insomnia  22-28 severe insomnia     Diagnoses: Insomnia, psychophysiological    Treatment/Intervention:  I provided 35 minutes of individual psychotherapy, using the time to review her sleep diaries, talk about stimulus control startegies and their variants to try when she cannot sleep, and encourage her to take an empirical approach to these based on continued self-monitoring. She appeared to appreciate today's visit.     Plan: We have no further sessions scheduled.

## 2016-07-15 NOTE — Patient Instructions (Signed)
It was good seeing you again today. Please continue to keep track of your sleep progress, and try some of the new stimulus-control based strategies we discussed in session today (e.g., getting out of bed, or sitting up in bed, to read or do another quiet activity until you feel drowsy enough to fall back to sleep easily).

## 2016-07-18 ENCOUNTER — Ambulatory Visit: Payer: Medicare PPO | Attending: Gastroenterology | Admitting: Gastroenterology

## 2016-07-18 ENCOUNTER — Encounter (HOSPITAL_BASED_OUTPATIENT_CLINIC_OR_DEPARTMENT_OTHER): Payer: Self-pay | Admitting: Gastroenterology

## 2016-07-18 VITALS — BP 138/58 | HR 64 | Temp 97.3°F | Wt 121.0 lb

## 2016-07-18 DIAGNOSIS — K5989 Other specified functional intestinal disorders: Secondary | ICD-10-CM

## 2016-07-18 DIAGNOSIS — K581 Irritable bowel syndrome with constipation: Secondary | ICD-10-CM | POA: Insufficient documentation

## 2016-07-18 DIAGNOSIS — K30 Functional dyspepsia: Secondary | ICD-10-CM | POA: Insufficient documentation

## 2016-07-18 DIAGNOSIS — K598 Other specified functional intestinal disorders: Secondary | ICD-10-CM | POA: Insufficient documentation

## 2016-07-18 DIAGNOSIS — Z6822 Body mass index (BMI) 22.0-22.9, adult: Secondary | ICD-10-CM

## 2016-07-18 NOTE — Progress Notes (Addendum)
GASTROENTEROLOGY CLINIC NOTE    DATE OF SERVICE: 07/18/2016  LAST CLINIC VISIT: 04/25/2016     ID/CC:  76 year old female with a history of uterine prolapse s/p post hysterectomy and urge incontinence here for follow-up alternating diarrhea and constipation and nausea. Previously diagnosed with IBS.    HISTORY OF PRESENT ILLNESS:  Please refer to Dr. Eliott Nine note on 01/302/107 for further details.  In brief, history of longstanding bowel issues bothering her for 60+ years. Diagnosed as IBS and treated with laxatives for constipation. She also has a history of overactive bladder/urge incontinence, glaucoma, osteoporosis, sacroiliac joint dysfunction and s/p hysterectomy for uterine prolapse. She reports 3 vaginal deliveries and associated tearing with the first birth.     She also endorsed severe nausea without vomiting and noticeable abdominal bloating and distention that occurs within a few minutes of ingesting certain "trigger foods". Previous trial of omeprazole was not helpful. She identifies more symptoms with ingestion of chocolate, seeds, high fiber, high fat.  She'll within a few minutes of ingestion noticed some significant nausea without vomiting which will potentially be alleviated by ingestion of Coca-Cola.     During LCV, no improvement with PT. Having a bowel movement for 1 to 3 days and then a couple of times the following day. Feels better on the day more pooping. Never tried the tap water enemas. PT worked more on abdominal massaging (saw [facility]). Most bothered by nausea and abdominal bloating. Hard to see if related to bowel movements.  Feels best when she doesn't eat. When she eats, feels really full after eating as though food just sitting there.    Recommended a trial of tap water enemas, re-referral to PT with Beatriz Stallion, and a trial of ranitidine.  Also recommended a [facility].    INTERVAL HISTORY:  [place] normal.    Her gut is more troublesome than it has ever been.  Lots of distention, flatulence,  malaise, and nausea.  If she tries to eat something hot or particulates (e.g., nuts), burning in her lower abdomen.  No obvious relationship between bowel movements and bowel symptoms.  With enemas, some improvement in bloating but no improvement with nausea.  Nausea more like a "malaise."  Re-trial of ranitidine - no change in symptoms.    RELEVANT STUDIES:  [facility], 06/24/2016: normal    MR defecography, 07/20/2015:  1. Severe descent of the posterior compartment during defecation.  2. Moderate bladder prolapse and mild vaginal prolapse during defecation.    EGD, 06/2015: normal; duodenum: increased intraepithelial lymphocytes but no villous blunting, stomach: chronic antral gastritis with intestinal metaplasia, neg H pylori    Colo, 06/2015: moderately severe diverticulosis in sigmoid and transverse colon; path: TI: normal, colon: normal    CT abd with contrast 08/07/13  FINDINGS:Lung bases are clear. No pericardial fluid. There is a low-densitylesion in the right hepatic lobe measuring 9 mm which does not havesimple fluid attenuation but cannot be further characterized. Thislesion is unchanged from CT of 05/30/2003. The gallbladder,pancreas, adrenal glands are normal. There are multiple granulomatawithin the spleen. The kidneys are normal.The stomach, small bowel, appendix, cecum normal. The colon andrectosigmoid colon are normal. There are several diverticula of thesigmoid colon without acute inflammation.There is no significant umbilical hernia. Tiny left inguinal hernia.Abdominal or is normal caliber. No retroperitoneal periportallymphadenopathy. . No free fluid the pelvis. Post hysterectomyanatomy. Postsurgical change at the bladder neck. No pelviclymphadenopathy. Adnexa are normal. No aggressive osseous lesion.IMPRESSION:1. No explanation for periumbilical pain. No ventral hernia orsignificant  inguinal hernia.2. Normal volume of stool.3. Normal appendix.4. Post hysterectomy.    Endoscopy 2012: report not  available    RUQ u/s 01/26/2010: normal     CURRENT AND PRIOR THERAPIES:  Linzess: tried 2 doses 2015, intolerant to diarrhea  Amitiza, low dose trial 08/23/13  Probiotic: align  Dietary modification: gluten free, low fat, seeds/high fiber, chocolate avoidance  Fiber - not currently using, worsens nausea/bloating  miralax - 17g per day, loosens stool, stopped to minimize meds   Prunes  Enemas - sometimes helps  Ranitidine - no improvement    PAST MEDICAL & SURGICAL HISTORY:  Active Ambulatory Problems     Diagnosis Date Noted    Hypertension 07/07/2014    Irritable bowel syndrome with diarrhea 11/21/2014    Sacroiliac joint dysfunction 11/21/2014    Osteoporosis 11/21/2014    OAB (overactive bladder) 03/05/2015    Psychophysiological insomnia 03/05/2015    Pelvic floor dysfunction 05/04/2015    Chronic idiopathic constipation 05/04/2015    Nausea 05/04/2015     Resolved Ambulatory Problems     Diagnosis Date Noted    No Resolved Ambulatory Problems     Past Medical History:   Diagnosis Date    Arthritis     Cancer ([place])     Constipation     Diverticulosis     Glaucoma     H. pylori infection     Headache     History of diarrhea     History of irritable bowel syndrome     Hypertension     Lipidemia     Osteoporosis      Urge incontinence; tx posterior tibial nerve stimulator 2014, antimuscarinics, myrbetriq, (prev recd interstim, botox for refractory sx)  Chronic headache  IBS  HTN  Glaucoma  OA  3 vaginal births, +tearing  Colon polyps: last colo 2014?    [place]  Hysterectomy 1996  Bladder suspension 1985, 1995      ALL:  Review of patient's allergies indicates:  Allergies   Allergen Reactions    Caffeine      Other reaction(s): Nausea Only  Extreme nervousness        MEDS:  Outpatient Medications Prior to Visit   Medication Sig Dispense Refill    ACETAMINOPHEN-CODEINE #4 OR Cough syrup      Acetaminophen-Codeine 120-12 MG/5ML Oral Solution take 5 milliliter by mouth every 6 hours if needed  for cough 118 mL 0    Hydrocortisone 1 % External Cream Apply to ears BID prn itchy ears 1 Tube 2    Latanoprost 0.005 % Ophthalmic Solution Place 1 drop in each EYE at bedtime.  1    RaNITidine HCl 150 MG Oral Tab Take 2 tablets (300 mg) by mouth at bedtime. 60 tablet 4    Timolol Maleate 0.5 % Ophthalmic GEL FORMING SOLUTION Place 1 drop in each EYE every morning.  1     No facility-administered medications prior to visit.        REVIEW OF SYSTEMS:  ROS as documented on the Patient Information Form completed by the patient and this was reviewed with the patient.    SOCIAL HISTORY:  Retired Location manager; moved from [place] to [place] to be close to her children (dtr is hospitalist for [place]); lives in [place]: denies  EtOH: 4 drinks/mo, no hx of heavy use  Drugs: denies  NSAIDS: rare    FAMILY HISTORY:  No FH of gastrointestinal diseases or malignancies.  Mother with "spastic  colon"     PHYSICAL EXAMINATION:  Vitals:    07/18/16 1536   BP: 138/58   BP Cuff Size: Regular   BP Site: Left Arm   BP Position: Sitting   Pulse: 64   Temp: 97.3 F (36.3 C)   TempSrc: Temporal   Weight: 121 lb (54.9 kg)   GEN: well-kept, well-developed, normal weight, NAD  HEENT: anicteric, atraumatic, MMM  NECK: supple  CHEST: breathing comfortably  ABD: soft  EXTR: no LE edema bilat  SKIN: no rashes  NEURO: A&Ox3, MAEE, nl gait    LABS:  Results for orders placed or performed in visit on 09/07/15   WET PREP/KOH/TEST, ONSITE   Result Value Ref Range    Odor ABSENT     WBC 1+     RBC, Wet Prep NEG     Bacteria NEG     Clue Cells 3+ (A)     Yeast, Wet Prep NEG     Epithelial Cells, Wet Prep 3+     Trich NEG     Other, Wet Prep NEG        RADIOLOGY:  Please refer to HPI    ASSESSMENT/PLAN:  76 year old  female history of uterine prolapse s/p hysterectomy and urge incontinence presenting with ongoing issues of postprandial bloating/distention and abd pain, early satiety and severe nausea, possibly aggravated with  constipation but having alternating bowel habits.  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  Unfortunately no response to PT but sounds like she only had abdominal massage therapy and no biofeedback. EGD and colonoscopy only notable for severe diverticulosis.  [facility] WNL.  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     Due to her past sensitivities to antidepressants and anxiolytics, patient would prefer non-medication management of her visceral hypersensitivity/functional dyspepsia/IBS.    -Referral to psychology for CBT for her visceral hypersensitivity/functional dyspepsia/IBS  -In the future, could also consider MBSR versus hypnotherapy  -Re-referral to PT for biofeedback (pending)  -Continue tap water enemas    RTC as needed    Halford Chessman, MD  Acting Assistant Professor  Division of Gastroenterology  [facility]  Pager [phone]

## 2016-07-18 NOTE — Patient Instructions (Signed)
Visceral hypersensitivity/functional dyspepsia    1. Expand CBT for IBS/visceral hypersensitivity  2. In the future, may also consider hypnotherapy (Dr. Jonelle Sports) or enroll in mindfulness classes "google Mindfulness Based Stress reduction"  3. Continue enemas as needed  4. Follow-up with pelvic floor PT    Follow-up as needed

## 2016-07-19 DIAGNOSIS — K30 Functional dyspepsia: Secondary | ICD-10-CM | POA: Insufficient documentation

## 2016-07-25 ENCOUNTER — Telehealth (INDEPENDENT_AMBULATORY_CARE_PROVIDER_SITE_OTHER): Payer: Self-pay | Admitting: Internal Medicine

## 2016-07-25 DIAGNOSIS — M6289 Other specified disorders of muscle: Secondary | ICD-10-CM

## 2016-07-25 DIAGNOSIS — K5904 Chronic idiopathic constipation: Secondary | ICD-10-CM

## 2016-07-25 DIAGNOSIS — K5989 Other specified functional intestinal disorders: Secondary | ICD-10-CM

## 2016-07-25 NOTE — Telephone Encounter (Signed)
Patient appears to have [place] insurance which has no Technical sales engineer with [place] [facility]. Please do not offer BHIP here.

## 2016-07-25 NOTE — Telephone Encounter (Signed)
OK to let pt know; refer back to insurance for preferred providers.

## 2016-07-25 NOTE — Telephone Encounter (Signed)
Pt informed of message below and will can her insurance and then contact us if a referral is needed       Closing TE

## 2016-07-25 NOTE — Telephone Encounter (Signed)
Staff:   Please call Ms. raap.  Please let her know that the psych department says she can do Dr Feliberto Harts recommended CBT at this clinic. So I have referred her here.     BHIP team: see separate referral message exchange.

## 2016-07-26 ENCOUNTER — Telehealth (INDEPENDENT_AMBULATORY_CARE_PROVIDER_SITE_OTHER): Payer: Self-pay

## 2016-07-26 NOTE — Telephone Encounter (Signed)
Called patient and gave community resources

## 2016-07-26 NOTE — Telephone Encounter (Signed)
Spoke with patient and informed no behavioral Careers adviser for [place], Gave community resources.

## 2016-07-27 ENCOUNTER — Ambulatory Visit (INDEPENDENT_AMBULATORY_CARE_PROVIDER_SITE_OTHER): Payer: Medicare PPO | Admitting: Family Medicine

## 2016-07-27 VITALS — BP 166/76 | HR 59 | Temp 99.0°F | Resp 14 | Wt 121.0 lb

## 2016-07-27 DIAGNOSIS — Z6822 Body mass index (BMI) 22.0-22.9, adult: Secondary | ICD-10-CM

## 2016-07-27 DIAGNOSIS — J019 Acute sinusitis, unspecified: Secondary | ICD-10-CM

## 2016-07-27 DIAGNOSIS — R05 Cough: Secondary | ICD-10-CM

## 2016-07-27 DIAGNOSIS — R059 Cough, unspecified: Secondary | ICD-10-CM

## 2016-07-27 MED ORDER — BENZONATATE 100 MG OR CAPS
100.0000 mg | ORAL_CAPSULE | Freq: Three times a day (TID) | ORAL | 0 refills | Status: DC | PRN
Start: 2016-07-27 — End: 2016-09-22

## 2016-07-27 MED ORDER — AMOXICILLIN-POT CLAVULANATE 875-125 MG OR TABS
1.0000 | ORAL_TABLET | Freq: Two times a day (BID) | ORAL | 0 refills | Status: AC
Start: 2016-07-27 — End: 2016-08-03

## 2016-07-27 NOTE — Progress Notes (Signed)
[place]      Chief Complaint   Patient presents with    Cough     cough, nasal congestion and drainage x 10 days       SUBJECTIVE:  Robin Hart is an 76 year old female who presents with cough, nasal congestion/discharge  Symptoms x 10 days  Notes she is feeling worse and the symptoms have not improved at all  Cough is not necessarily worse at night  Denies sinus pain  No fever  No shortness of breath  Sore throat  Ears itch  Has had diarrhea  Has been eating and drinking okay  No pulmonary issues. No history of smoking  Has tried flonase, Human resources officer, dayquil, nasal sinus rinses for symptom relief  Daughter and granddaughter with similar sympoms    ROS    I have  personally reviewed the medications, allergies, ROS and social history with the patient.        Review of patient's allergies indicates:  Allergies   Allergen Reactions    Caffeine      Other reaction(s): Nausea Only  Extreme nervousness        OBJECTIVE:  BP 166/76    Pulse 59    Temp 99 F (37.2 C) (Temporal)    Resp 14    Wt 121 lb (54.9 kg)    SpO2 98%    BMI 22.86 kg/m   Physical Exam   Gen: A&O, NAD  Head: normocephalic, atraumatic  Eyes: PERRL, sclera and conjunctiva normal  Ears: Canals and TMs normal bilaterally  Nose is congested, no sinus tenderness  Throat: clear, no cervical adenopathy, mucous membranes moist  Heart: RRR, no M/R/G  Lungs: CTA Bilaterally, normal effort     A/P  1. Acute non-recurrent sinusitis, unspecified location  Continue symptomatic treatment. If symptoms are not improving, or if they worsen, over the next few days she will start the antibiotic as prescribed  Follow up if symptoms worsen or fail to resolve as anticipated.    - Amoxicillin-Pot Clavulanate (AUGMENTIN) 875-125 MG Oral Tab; Take 1 tablet by mouth every 12 hours for 7 days.  Dispense: 14 tablet; Refill: 0    2. Cough  - Benzonatate 100 MG Oral Cap; Take 1 capsule (100 mg) by mouth 3 times a day as needed for  cough.  Dispense: 30 capsule; Refill: 0

## 2016-07-27 NOTE — Patient Instructions (Signed)
Patient Education     Sinusitis (No Antibiotics)    The sinuses are air-filled spaces within the bones of the face. They connect to the inside of the nose.Sinusitisis an inflammation of the tissue lining the sinus cavity. Sinus inflammation can occur during a cold.It can also be due to allergies to pollens and other particles in the air.It can cause symptoms such as sinus congestion,headache, sore throat, facial swellingand fullness. It may also cause a low-grade fever.No infection is present, and no antibiotic treatment is needed.  Home care   Drink plenty of water, hot tea, and other liquids. This may help thin mucus. It also may promote sinus drainage.   Heat may help soothe painful areas of the face. Use a towel soaked in hot water. Or, stand in the shower and direct the hot spray onto your face. Using a vaporizer along with a menthol rub at night may also help.   Anexpectorantcontaining guaifenesin may help thin the mucus and promote drainage from the sinuses.   Over-the-counterdecongestantsmay be used unless a similar medicine was prescribed. Nasal sprays work the fastest. Use one that contains phenylephrine or oxymetazoline. First blow the nose gently. Then use the spray. Do not use these medicines more often than directed on the label or symptoms may get worse. You may also use tablets containing pseudoephedrine. Avoid products that combine ingredients, because side effects may be increased. Read labels. You can also ask the pharmacist for help. (NOTE:Persons with high blood pressure should not use decongestants. They can raise blood pressure.)   Over-the-counterantihistaminesmay help if allergies contributed to your sinusitis.    Use acetaminophen or ibuprofen to control pain, unless another pain medicine was prescribed. (If you have chronic liver or kidney disease or ever had a stomach ulcer, talk with your doctor before using these medicines. Aspirin should never be used in anyone  under 18 years of age who is ill with a fever. It may cause severe liver damage.)   Use nasal rinses or irrigation as instructed by your health care provider.   Don't smoke. This can worsen symptoms.  Follow-up care  Follow up with your healthcare provider or our staff if you are not improving within the next week.  When to seek medical advice  Call your healthcare provider if any of these occur:   Green or yellow discharge from the nose or into the throat   Facial pain or headache becoming more severe   Stiff neck   Unusual drowsiness or confusion   Swelling of the forehead or eyelids   Vision problems, including blurred or double vision   Fever of100.4F (38C)or higher, or as directed by your healthcare provider   Seizure   Breathing problems   Symptoms not resolving within 10 days  Date Last Reviewed: 07/15/2013   2000-2017 [employer] [address], [place] [address]. All rights reserved. This information is not intended as a substitute for professional medical care. Always follow your healthcare professional's instructions.

## 2016-07-29 ENCOUNTER — Encounter (INDEPENDENT_AMBULATORY_CARE_PROVIDER_SITE_OTHER): Payer: Self-pay | Admitting: Internal Medicine

## 2016-07-29 ENCOUNTER — Telehealth (INDEPENDENT_AMBULATORY_CARE_PROVIDER_SITE_OTHER): Payer: Self-pay | Admitting: Internal Medicine

## 2016-07-29 NOTE — Telephone Encounter (Signed)
(  TEXTING IS AN OPTION FOR [facility] ONLY)  Is this a [facility] clinic? [phone]      RETURN CALL: Detailed message on voicemail only      SUBJECT:  General Message     REASON FOR REQUEST: Other    MESSAGE: Patient is wanting to know if provider needs to see her for the 08/03/16 appointment.

## 2016-07-29 NOTE — Telephone Encounter (Signed)
Pt cancelled her appt, is doing well and is seeing cognitive therapy which is going well.

## 2016-08-03 ENCOUNTER — Encounter (INDEPENDENT_AMBULATORY_CARE_PROVIDER_SITE_OTHER): Payer: Medicare PPO | Admitting: Internal Medicine

## 2016-08-04 ENCOUNTER — Encounter (HOSPITAL_BASED_OUTPATIENT_CLINIC_OR_DEPARTMENT_OTHER): Payer: Medicare PPO | Admitting: Optometrist

## 2016-08-22 ENCOUNTER — Encounter (INDEPENDENT_AMBULATORY_CARE_PROVIDER_SITE_OTHER): Payer: Self-pay | Admitting: Internal Medicine

## 2016-09-21 NOTE — Progress Notes (Signed)
Visit:   Chief Complaint   Patient presents with   . Back Pain     Patient is here today because she would like to discuss back pain   . Foot Pain     Patient would also like to discuss right sided foot, hip and leg pain that has been chronic for a few years         Refills? NO  Referral? NO  Letter or Form? NO  Lab Results? NO    HEALTH MAINTENANCE:  Has the patient has this done since their last visit?  Cervical screening/PAP: N/A  Mammo: N/A  Colon Screen: N/A  Diabetic Eye Exam (If applicable): N/A    Have you seen a specialist since your last visit: No    Vaccines Due? Yes, pcv-13, zoster, tdap    Does patient have eCare?  yes    HM Due:   Health Maintenance   Topic Date Due   . Tetanus Vaccine  03[birth year]05/1960   . Colorectal Cancer Screening (FOBT/FIT)  03/06/1991   . Zoster Vaccine  03/05/2001   . Pneumococcal Vaccine (1 of 2 - PCV13) 03/05/2006   . Depression Screening (PHQ-2)  05/20/2017   . Lipid Disorders Screening  08/05/2020   . Osteoporosis Screening  Completed   . Influenza Vaccine  Completed       PCP Verified?  Yes, Volkman, Mathews Robinsons, MD

## 2016-09-22 ENCOUNTER — Ambulatory Visit (INDEPENDENT_AMBULATORY_CARE_PROVIDER_SITE_OTHER): Payer: Medicare PPO | Admitting: Internal Medicine

## 2016-09-22 ENCOUNTER — Encounter (INDEPENDENT_AMBULATORY_CARE_PROVIDER_SITE_OTHER): Payer: Self-pay | Admitting: Internal Medicine

## 2016-09-22 VITALS — BP 142/67 | HR 62 | Temp 97.8°F | Resp 14 | Wt 119.6 lb

## 2016-09-22 DIAGNOSIS — M545 Low back pain: Secondary | ICD-10-CM

## 2016-09-22 DIAGNOSIS — M4306 Spondylolysis, lumbar region: Secondary | ICD-10-CM

## 2016-09-22 DIAGNOSIS — M216X9 Other acquired deformities of unspecified foot: Secondary | ICD-10-CM | POA: Insufficient documentation

## 2016-09-22 DIAGNOSIS — G8929 Other chronic pain: Secondary | ICD-10-CM

## 2016-09-22 DIAGNOSIS — M2141 Flat foot [pes planus] (acquired), right foot: Secondary | ICD-10-CM

## 2016-09-22 DIAGNOSIS — R29898 Other symptoms and signs involving the musculoskeletal system: Secondary | ICD-10-CM

## 2016-09-22 DIAGNOSIS — Z6822 Body mass index (BMI) 22.0-22.9, adult: Secondary | ICD-10-CM

## 2016-09-22 NOTE — Patient Instructions (Signed)
It was a pleasure to see you in clinic today. Your Medical Assistant was: Josi Roediger, MA.          You can schedule an appointment to see us by calling  [phone] or via eCare.     If labs were ordered today the results are expected to be available via eCare 5 days later. Otherwise, result letters are mailed 7-10 days after your tests are completed. If your physician needs to change your care based on your results, you will receive a phone call to notify you. If you haven't heard from him/her and it has been more than 10 days please give us a call.     You may receive a survey in the mail asking how your experience has been with [facility].  Your input and opinions are very important to us.  I hope you are able to take the time to complete this survey.    Thank you for choosing [facility].

## 2016-09-22 NOTE — Progress Notes (Signed)
Robin Hart is a 76 year old female who presents today for back and leg pain.       Assessment and Plan:    1. Chronic midline low back pain, with sciatica presence unspecified, 2. Lumbar spondylolysis, 3. Weakness of toe  She does not have radiating-type pain but the distribution of sensory and motor symptoms is suggestive of lumbar radiculopathy. Suggested XR and seeing spine.   - XR L SPINE 2-3 VW  - REFERRAL [facility]    ---    Follow up: in July for wellness        Subjective:     History of Present Illness:       3 discrete pains? But all on the R side.   R foot pain is "yucky"    Not new, just worse   History of fallen arch  Pain in the transverse arch and the lateral R 5th metatarsal area.     Pain from the R hip and back. Across the low back. Not on the trochanter.     Pain in the R calf--aching, tries to rub it. Numb patch on the lateral calf. Sometimes feel like the leg wants to give out. Better balancing on the L than the R.  Not a stinging burning.     Previously saw a chiro in [place] who did manipulations and imaging. Xrays reportedly with some spondylosis in the C and L spine. And dx'ed with some SI joint dysfunction,      Review of Systems   Gastrointestinal:        About to start pelvic floor PT       Objective:     PE:    Vitals:    09/22/16 1431 09/22/16 1433   BP: 152/74 142/67   BP Cuff Size: Regular Regular   BP Site: Left Arm Left Arm   BP Position: Sitting Sitting   Pulse: 62    Resp: 14    Temp: 97.8 F (36.6 C)    TempSrc: Temporal    SpO2: 98%    Weight:  119 lb 9.6 oz (54.3 kg)     Body mass index is 22.6 kg/m.    Physical Exam   Constitutional: She appears well-developed and well-nourished.   Eyes: Conjunctivae are normal.   Pulmonary/Chest: Effort normal.   Musculoskeletal: She exhibits no edema.   Neg SLR bilaterally, good ROM  Neg FABERE bilaterally  No pain with logrolling of the hip  No TTP over the R trochanter    Mild nonspecific TTP over the bilateral low back        Strength in R foot eversion 5-/5 and EHL 4/5   Neurological:   Reflex Scores:       Bicep reflexes are 1+ on the right side and 1+ on the left side.       Patellar reflexes are 1+ on the right side and 1+ on the left side.       Achilles reflexes are 0 on the right side and 0 on the left side.  Patch of abnormal sensation to monofilamenton the R lateral calf/leg   Psychiatric: She has a normal mood and affect. Her behavior is normal.

## 2016-10-03 ENCOUNTER — Ambulatory Visit: Payer: Medicare PPO | Attending: Gastroenterology | Admitting: Rehabilitative and Restorative Service Providers"

## 2016-10-03 DIAGNOSIS — M6289 Other specified disorders of muscle: Secondary | ICD-10-CM | POA: Insufficient documentation

## 2016-10-03 DIAGNOSIS — N3941 Urge incontinence: Secondary | ICD-10-CM | POA: Insufficient documentation

## 2016-10-03 DIAGNOSIS — K5904 Chronic idiopathic constipation: Secondary | ICD-10-CM | POA: Insufficient documentation

## 2016-10-03 NOTE — Progress Notes (Signed)
Physical Therapy Evaluation     This note serves as a CMS evaluation form that requires the referring provider to certify the need for therapy services furnished under this Plan of Treatment. The signing provider is certifying the plan of care.    Certification: From 05/11/621 through 01/01/17      Time in:2:20pm            Time out: 3:15pm             Duration: 55 minutes  Today's date: 10/03/2016    Onset date: Date of Symptom Onset : 04/25/16    Referring Provider: Ward Givens  Referring provider NPI: [id number]    Treatment/Therapy diagnosis: (M62.89) Pelvic floor dysfunction  (primary encounter diagnosis)  (K59.04) Chronic idiopathic constipation  (N39.41) Urge incontinence    The following patient identifiers were confirmed today: name and date of birth.    Order/Reason for referral: Evaluation and Treatment    Initial assessment:  Subjective: Robin Hart presents with Difficulty with bowel movements, Incomplete defecation, urinary urgency and frequency with voids 1x per hour and difficulty making it to the bathroom without leakage.  She notes she also had low back pain and tailbone region pain.  Her urinary void interval is 1x per hour.  She is unable to delay her urge to void more than 10 minutes.  She will leak urine as she enters the restroom.  Her bowel movements vary from 5x per day to 1x per 2-3 days with them always feeling incomplete and intermittently painful.  Her fluid intake is 30-40 oz most days and include water, milk and coke.  Robin Hart has had 2 pelvic surgeries including hysterectomy with suspension.  She has had 3 live births via vaginal delivery .  Her youngest child is now 99 years old.  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  She has also underwent previous PT which focused on pelvic floor strengthening with limited change in symptoms.                     The patient is reporting 8/10 level of perceived severity of symptoms, on a 0-10 Numeric Distress Scale.                   Barriers  to learning: none                   Preferred Learning style: demonstration, written and verbal    Past Medical History:   Diagnosis Date   . Arthritis    . Cancer ([place])     skin cell carcinoma    . Constipation    . Diverticulosis    . Glaucoma    . H. pylori infection    . Headache    . History of diarrhea    . History of irritable bowel syndrome    . Hypertension    . Lipidemia    . Osteoporosis      Past Surgical History:   Procedure Laterality Date   . Basal cell carcinoma excision     . BLADDER SUSPENSION     . BREAST BIOPSY     . COLONOSCOPY STOMA DX INCLUDING COLLJ SPEC SPX     . TOTAL ABDOMINAL HYSTERECT W/WO RMVL TUBE OVARY         Fall Risk: The patient has not fallen in the past year.    Prior Level of Function: Before the onset of the current condition, the patient reports - Prior  Level of Function  Prior Function Comments: Able to have bowel movements without difficulty.  Able to delay urinary urge without leakage, urinary void interval of 1x per 2 hours.     Level of function at start of care: Bowel movements are incomplete.  Bowel movements vary from 1x per 3-4 days to 5x per day.  She is unable to delay her urinary urge to void at all.  She has urinary leakage on the way to the bathroom.  Cultural Practices that Influence Care: none    Prior to perineal and pelvic floor muscle examination, examination testing procedures were explained to the patient, the patient was provided with opportunity to address questions or concerns, and verbal consent was given to proceed.    Objective:  Observation: Skin integrity : intact without abnormality.  Vaginal vault size: WNL, Rectal Canal Size  Not assessed this date, Sitting Tolerance limited due to back and tailbone region pain, Posture WNL except sitting off to the side due to tailbone region pain  Impairments:       Range of motion/Quality of motion/Recruitment Pattern:  Able to perform PF muscle lift, release and excursion WNL with noted coordinated  contraction        Muscle Strength: Laycock: 3/5/5/5 (MMT/Hold in seconds/repetitions at hold/quick)        Sensation:  Intact to light touch throughout the perineum        Reflexes: Cough  intact  Anal wink  intact   Palpation:4/5 restriction to mobility throughout the perineum.     Prolapse/Tissue Laxity:noted general perineum descent with excursion   External Anal Sphincter Contraction: intact with noted ability to contraction and release and allow for excursion   Lower Quadrant Screen: Will complete at future sessions as needed    Interventions/Treatment provided:  Moderate Complexity for 30 minutes PT evaluation, Manual therapy for 15 minutes and Therapeutic activities for 10 minutes.   Physical Therapy evaluation completed as noted above.  Education in pelvic floor muscle function and importance of coordination of both mobility and stability for function and defecation mechanics.  Education in bladder function and bowel function including bradley's reflexes and RAIR   Manual Therapy: soft tissue mobility to the perineum and adductors bilaterally.  Therapeutic activities: general down training for home.  Relaxed awareness of the PF muscles.    Home exercise instruction: verbal instructions and demonstrations were provided for the above exercises  the patient was able to return demonstrate the exercises accurately after instructions  The patient was able to demonstrate the exercises after instructions., The patient was able to communicate understanding of the instructions and precautions.    Plan of care: Patient education in pelvic girdle anatomy/function, bladder and bowel health, bladder/or bowel retraining, and skin care.  Treatment interventions including Therapeutic exercise, neuromuscular reeducation, Manual Therapy, and self-care progression.    This patient will be seen for 1 therapy sessions per week for 12 weeks with decreasing frequency.  The patient will perform the instructed home program 2-3 times  per day.  The therapy will include therapeutic exercise, manual therapy and neuromuscular reeducation  This patient will recheck with the referring provider in 8 weeks.    Place the Severity Modifier in the box to the left of the appropriate code:   Functional Limitation Code  Current Status  G Code  Goal Status  G Code  D/C   Status  G Code    Self Care Self care functional limitation  CK G8987  CI [phone]  G8989      Severity Modifiers Key    CH 0% impaired, limited or restricted    CI At least 1% but less than 20% impaired, limited or restricted    CJ At least 20 %but less than 40% impaired, limited or restricted    CK At least 40% but less than 60%impaired, limited or restricted    CL At least 60% but less than 80% impaired, limited or restricted    CM At least 80% but less than 100% impaired, limited or restricted    CN 100% impaired, limited or restricted        Current G Code Modifier and justification as to selection per patient report and clinician experience      Functional goals:                      Long term functional goals/Outcome:   Able to sit for at least 65min without pain over 2/10 by 01/01/17  Independent with self care program to reduce the reoccurrence of symptoms by 01/01/17  Void interval of 1x per 2 hours by 01/01/17  Able to delay urinary urge without urinary leakage for  at least 70minutes by 01/01/17  Regular Bowel Movements at least 1x per 2 days without need for manual assistance or pain over 2/10 by 01/01/17    ASSESSMENT:  Robin Hart is a 76 year old female who presents to Physical Therapy with a referral for (M62.89) Pelvic floor dysfunction  (primary encounter diagnosis)  (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 Signs/symptoms consistent with increased pelvic floor muscle resting level and poor myofascial mobility throughout the perineum contributing the bowel and bladder dysfunction. Patient would benefit from skilled Physical Therapy to focus on  manual therapy, pelvic floor muscle retraining and self care to achieve the stated functional goals. Patient's primary goal is to return to improved defecation and decreased urinary frequency.       Rehabilitation potential is: Excellent  These goals were discussed and the patient agrees with them.  This patient will be seen by a physical therapist or a physical therapist assistant following this plan of care.    Daiva Eves, PT

## 2016-10-08 ENCOUNTER — Encounter (INDEPENDENT_AMBULATORY_CARE_PROVIDER_SITE_OTHER): Payer: Self-pay | Admitting: Internal Medicine

## 2016-10-08 NOTE — Telephone Encounter (Signed)
Please see ecare regarding tailbone injury, Should patient come in to be seen?

## 2016-10-10 ENCOUNTER — Ambulatory Visit: Payer: Medicare PPO | Attending: Gastroenterology | Admitting: Rehabilitative and Restorative Service Providers"

## 2016-10-10 DIAGNOSIS — N3941 Urge incontinence: Secondary | ICD-10-CM | POA: Insufficient documentation

## 2016-10-10 DIAGNOSIS — K5904 Chronic idiopathic constipation: Secondary | ICD-10-CM | POA: Insufficient documentation

## 2016-10-10 DIAGNOSIS — M6289 Other specified disorders of muscle: Secondary | ICD-10-CM | POA: Insufficient documentation

## 2016-10-10 NOTE — Progress Notes (Signed)
Physical Therapy Progress Note   Time in: 1:30pm          Time out: 2:15pm        Duration: 45 minutes  The following identifiers were confirmed with the patient today:name and date of birth  Referring Provider: Ward Givens  Referring provider NPI: [id number]    Start of Care: 10/03/2016  Date of Symptom Onset : 04/25/16  Diagnosis: (M62.89) Pelvic floor dysfunction  (primary encounter diagnosis)  (N39.41) Urge incontinence  (K59.04) Chronic idiopathic constipation    Certification: From 11/06/2776 through 01/01/17    Number of Visits Since start of care: 2  Reporting Period from 10/10/2016 to 30 days from this date    Subjective/Pain: The patient has been  compliant with their Home care program.  Reports no change in symptoms yet.  She has been having an increased difficulty with controlling her urinary urgency  The patient is reporting 8/10 level of pain/percieved severity of symptoms, on a 0-10 Numeric Pain Distress Scale.     Skilled services/interventions provided:Manual therapy for 30 minutes and Therapeutic activities for 15 minutes.   Manual Therapy: soft tissue mobility to the perineum and adductors bilaterally.  Tender point release to the same region.  Therapeutic activities: instruction in urge suppression strategies, bladder retraining.  Progression of self care to include general down training as instructed at initial session and progression to include general LE stretching including hamstring and adductor stretches.    Patient's response to therapy: improved awareness of down training post session.    Patient education/instruction: a handout was provided describing the exercises in written and picture format  the patient was able to return demonstrate the exercises accurately after instructions    Change in impairments: Unchanged from initial session as expected with limited PT intervention at this time  Observation: Skin integrity : intact without abnormality.  Vaginal vault size: WNL, Rectal Canal Size   Not assessed this date, Sitting Tolerance limited due to back and tailbone region pain, Posture WNL except sitting off to the side due to tailbone region pain  Impairments:       Range of motion/Quality of motion/Recruitment Pattern:  Able to perform PF muscle lift, release and excursion WNL with noted coordinated contraction        Muscle Strength: Laycock: 3/5/5/5 (MMT/Hold in seconds/repetitions at hold/quick)   Palpation:4/5 restriction to mobility throughout the perineum.     Prolapse/Tissue Laxity:noted general perineum descent with excursion   External Anal Sphincter Contraction: intact with noted ability to contraction and release and allow for excursion   Lower Quadrant Screen: Will complete at future sessions as needed        Functional improvement/progress toward each goals:   Bowel movements are incomplete.    Bowel movements vary from 1x per 3-4 days to 5x per day.    She is unable to delay her urinary urge to void at all.    She has urinary leakage on the way to the bathroom.  Sitting over 20 minutes results in increasing pain  Requires guidance to progress self care      Functional limitation(s) remaining requiring continued skilled services:   Able to sit for at least 75min without pain over 2/10 by 01/01/17  Independent with self care program to reduce the reoccurrence of symptoms by 01/01/17  Void interval of 1x per 2 hours by 01/01/17  Able to delay urinary urge without urinary leakage for  at least 29minutes by 01/01/17  Regular Bowel Movements at  least 1x per 2 days without need for manual assistance or pain over 2/10 by 01/01/17      Any changes in plan of treatment: Next session progress to include defecation mechanics and positioning for bowel movements    Daiva Eves, PT

## 2016-10-14 ENCOUNTER — Telehealth (INDEPENDENT_AMBULATORY_CARE_PROVIDER_SITE_OTHER): Payer: Self-pay | Admitting: Internal Medicine

## 2016-10-14 NOTE — Telephone Encounter (Signed)
Staff: please call pt.   Has not read my e-care response.   Please review below and route back if any questions/concerns from pt:     "I'm sorry to hear that! I think you are doing the right things.   You may have 'bruised' the area vs caused a minor fracture to the distal tailbone.   Initial management for either type of injury is conservative, meaning use of a donut pillow to sit, ice or heat, aleve (or tylenol) if tolerated. Can reduce your activity but please continue to move about.   If there is a fracture, should heal in 6-8 weeks.   So, if you still have a lot of pain 4+ wks from the injury, I would consider re-imaging to confirm the pain is from a bony injury and that we don't need to work you up for something else, or to see if things are not healing properly.     Does that make sense?     -[place]"

## 2016-10-14 NOTE — Telephone Encounter (Signed)
Pt will go in and read the [facility] message.    Closing TE

## 2016-10-17 ENCOUNTER — Ambulatory Visit (HOSPITAL_BASED_OUTPATIENT_CLINIC_OR_DEPARTMENT_OTHER): Payer: Medicare PPO | Admitting: Rehabilitative and Restorative Service Providers"

## 2016-10-17 DIAGNOSIS — N3941 Urge incontinence: Secondary | ICD-10-CM

## 2016-10-17 DIAGNOSIS — K5904 Chronic idiopathic constipation: Secondary | ICD-10-CM

## 2016-10-17 DIAGNOSIS — M6289 Other specified disorders of muscle: Secondary | ICD-10-CM

## 2016-10-17 NOTE — Progress Notes (Signed)
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  Reports she continues with difficulty with urinary urgency.  She also notes that her bowel symptoms have been less but she is unsure if this is due to the natural ebb and flow of her symptoms or if this is because it is better  The patient is reporting 8/10 level of pain/percieved severity of symptoms, on a 0-10 Numeric Pain Distress Scale.     Skilled services/interventions provided:Manual therapy for 10 minutes and Therapeutic activities for 25 minutes.   Manual Therapy: soft tissue mobility to the perineum and adductors bilaterally.  Tender point release to the same region.  Therapeutic activities: Review urge suppression strategies, bladder retraining and general down training as instructed at initial session and progression to include general LE stretching including hamstring and adductor stretches.  Progressed to include defecation mechanics including abdominal brace with general down ward pressure and lengthening of the pelvic floor muscles.  Patient with difficulty performing but able to perform by the end of the session.  Will add to home    Patient's response to therapy: Able to demonstrate defecation positioning and mechanics by the end of the session.    Patient education/instruction: a handout was provided  describing the exercises in written and picture format  the patient was able to return demonstrate the exercises accurately after instructions    Change in impairments:   Observation: Skin integrity : intact without abnormality.  Vaginal vault size: WNL, Rectal Canal Size  Not assessed this date, Sitting Tolerance limited due to back and tailbone region pain, Posture WNL except sitting off to the side due to tailbone region pain  Impairments:       Range of motion/Quality of motion/Recruitment Pattern:  Able to perform PF muscle lift, release and excursion WNL with noted coordinated contraction        Muscle Strength: Laycock: 06/06/08/10 (MMT/Hold in seconds/repetitions at hold/quick)   Palpation:3/5 restriction to mobility throughout the perineum.     Prolapse/Tissue Laxity:noted general perineum descent with excursion   External Anal Sphincter Contraction: intact with noted ability to contraction and release and allow for excursion   Lower Quadrant Screen: Will complete at future sessions as needed        Functional improvement/progress toward each goals:   Bowel movements are incomplete.    Bowel movements vary from 1x per 3-4 days to 5x per day.    She is unable to delay her urinary urge to void longer than 5 minutes.    She has urinary leakage on the way to the bathroom 50% of the time.  Sitting over 20 minutes results in increasing pain  Requires guidance to progress self care      Functional limitation(s) remaining requiring continued skilled services:  Able to sit for at least 28min without pain over 2/10 by 01/01/17  Independent with self care program to reduce the reoccurrence of symptoms by 01/01/17  Void interval of 1x per 2 hours by 01/01/17  Able to delay urinary urge without urinary leakage for  at least 17minutes by 01/01/17  Regular Bowel Movements at least 1x per 2 days without need for manual assistance or pain over 2/10 by 01/01/17      Any changes in plan of treatment: Decrease session frequency to  1x per 2-3 weeks to assist with progression of self care    Daiva Eves, PT

## 2016-10-24 ENCOUNTER — Encounter (HOSPITAL_BASED_OUTPATIENT_CLINIC_OR_DEPARTMENT_OTHER): Payer: Medicare PPO | Admitting: Rehabilitative and Restorative Service Providers"

## 2016-10-28 ENCOUNTER — Ambulatory Visit (INDEPENDENT_AMBULATORY_CARE_PROVIDER_SITE_OTHER): Payer: Medicare PPO | Admitting: Internal Medicine

## 2016-10-28 ENCOUNTER — Encounter (INDEPENDENT_AMBULATORY_CARE_PROVIDER_SITE_OTHER): Payer: Self-pay | Admitting: Internal Medicine

## 2016-10-28 VITALS — BP 134/75 | HR 53 | Temp 98.0°F | Resp 14 | Ht 59.84 in | Wt 118.8 lb

## 2016-10-28 DIAGNOSIS — Z23 Encounter for immunization: Secondary | ICD-10-CM

## 2016-10-28 DIAGNOSIS — Z6823 Body mass index (BMI) 23.0-23.9, adult: Secondary | ICD-10-CM

## 2016-10-28 DIAGNOSIS — Z9181 History of falling: Secondary | ICD-10-CM

## 2016-10-28 DIAGNOSIS — R29898 Other symptoms and signs involving the musculoskeletal system: Secondary | ICD-10-CM

## 2016-10-28 DIAGNOSIS — Z0000 Encounter for general adult medical examination without abnormal findings: Secondary | ICD-10-CM

## 2016-10-28 DIAGNOSIS — J309 Allergic rhinitis, unspecified: Secondary | ICD-10-CM

## 2016-10-28 DIAGNOSIS — M2140 Flat foot [pes planus] (acquired), unspecified foot: Secondary | ICD-10-CM

## 2016-10-28 MED ORDER — ZOSTER VAC RECOMB ADJUVANTED 50 MCG/0.5ML IM SUSR
INTRAMUSCULAR | 1 refills | Status: DC
Start: 2016-10-28 — End: 2017-02-17

## 2016-10-28 NOTE — Patient Instructions (Addendum)
Based on today's evaluation, I recommend the following ways to improve your health or functioning: see Robin Hart and continue exercising, try an antihistamine, get the shingles vaccine.      You have the following risk factors and/or medical conditions for which there are recommended ways (included in the list) to help you stay as healthy as possible:  1. Recent fall  2. allergies    Here are measures that our medical record shows may be recommended for you for screening & prevention, and when each is recommended:   Health Maintenance   Topic Date Due   . Tetanus Vaccine  03/05/1960   . Zoster Vaccine  03/05/2001   . Pneumococcal Vaccine (1 of 2 - PCV13) 03/05/2006   . Influenza Vaccine (1) 01/02/2017   . Depression Screening (PHQ-2)  10/28/2017   . Lipid Disorders Screening  08/05/2020   . Colorectal Cancer Screening (FOBT/FIT)  06/02/2025   . Osteoporosis Screening  Completed     Please plan to have a Subsequent Annual Wellness Visit next year       Cetirizine, fexofenadine or ??, brands Zyrtec, allegra, claritin   "nonsedating antihistamines"   Acceptable for longterm use if helpful   Use for 1-2 wks as a trial            Avoid pseudoephedrine (its now behind the counter)    It was a pleasure to see you in clinic today. Your Medical Assistant was: Psychiatrist, [place].          You can schedule an appointment to see [place] by calling  [phone] or via eCare.     If labs were ordered today the results are expected to be available via eCare 5 days later. Otherwise, result letters are mailed 7-10 days after your tests are completed. If your physician needs to change your care based on your results, you will receive a phone call to notify you. If you haven't heard from him/her and it has been more than 10 days please give [place] a call.     You may receive a survey in the mail asking how your experience has been with [facility].  Your input and opinions are very important to [place].  I hope you are able to take  the time to complete this survey.    Thank you for choosing [facility].

## 2016-10-28 NOTE — Progress Notes (Signed)
Visit:   Chief Complaint   Patient presents with   . Annual Exam     Patient is here today for an annual medicare wellness visit     Refills? NO  Referral? NO  Letter or Form? NO  Lab Results? NO    HEALTH MAINTENANCE:  Has the patient has this done since their last visit?  Cervical screening/PAP: N/A  Mammo: N/A  Colon Screen: Referral Pending  Diabetic Eye Exam (If applicable): N/A    Have you seen a specialist since your last visit: No    Vaccines Due? Yes, tdap, zoster, pcv-13    Does patient have eCare?  yes    HM Due:   Health Maintenance   Topic Date Due   . Tetanus Vaccine  03/05/1960   . Colorectal Cancer Screening (FOBT/FIT)  03/06/1991   . Zoster Vaccine  03/05/2001   . Pneumococcal Vaccine (1 of 2 - PCV13) 03/05/2006   . Influenza Vaccine (1) 01/02/2017   . Depression Screening (PHQ-2)  05/20/2017   . Lipid Disorders Screening  08/05/2020   . Osteoporosis Screening  Completed       PCP Verified?  Yes, Volkman, Mathews Robinsons, [place] presents for a subsequent Annual Wellness Visit.    Rooming activities:  Height and weight measured: YES  PHQ-2 responses entered into screening section: YES  PHQ-9 given if PHQ-2 score is >0: YES         HEALTH RISK ASSESSMENT:     Current providers and suppliers  X See Care Team Section  X See also EHR Encounters for [place] Providers involved in care  X Other providers and suppliers outside [facility]: Kirkland Hun, Jasmine Zia     SELF ASSESSMENT OF HEALTH:  How do you rate your overall health in the past 4 weeks?   good  Can you manage your health problems?  YES  Due to any health problems, do you need the help of another person with your personal care needs such as eating, bathing,  dressing or getting around the house?  NO      DEPRESSION SCREEN and PSYCHOSOCIAL HEALTH:  PHQ-2 Total Score:  PHQ2 Total Score: 0      Have your feelings caused you distress or interfered with your ability to get along socially with family  or friends? not at all   In the past 2 weeks, have you felt stress over health, finances, relationships or work?  not at all  Do you often get the emotional support you need? more than half of the days  In the past 2 weeks, how much body pain do you have? several days  In the past 2 weeks, how much fatigue do you have? not at all      [place]:  How much alcohol do you drink weekly?  1-2 drinks  Is your diet well-balanced/healthy, including protein, fiber, vegetables, and fruits? YES   Do you exercise regularly? YES         Type of exercise walking - low intensity stroll         Frequency of exercise 2 times per week  Do you always use your seat belt in the car? YES  How would you describe the condition of your mouth and teeth ? including false teeth or dentures?  excellent  Are you sexually active? NO  Do you find yourself having trouble hearing people speak?  No  Do you wear a hearing aid/device? No  Do you have a fire extinguisher in your home?  YES  Do you have a smoke detector?  YES        ACTIVITIES OF DAILY LIVING:  In your present state of health how much difficulty do you have with the following activities:       Preparing food and eating: 0 No impairment (the person has no problem)       Bathing yourself: 0 No impairment (the person has no problem)       Getting dressed: 0 No impairment (the person has no problem)       Using the toilet: 0 No impairment (the person has no problem)       Moving around from place to place: 0 No impairment (the person has no problem)    In the past year have you fallen or had a near fall? No  Do you feel safe in your home environment? YES     INSTRUMENTAL ACTIVITIES OF DAILY LIVING:  In your present state of health how much difficulty do you have with the following activities?       Shopping: 0 No impairment (the person has no problem)       Using the telephone: 0 No impairment (the person has no problem)       Housekeeping: 0 No impairment (the person has no  problem)       Laundry: 0 No impairment (the person has no problem)       Driving or using transportation (bus, taxi): 0 No impairment (the person has no problem)       Handling your own finances: 0 No impairment (the person has no problem)       Managing your own medications: 0 No impairment (the person has no problem)     SIGNS OF COGNITIVE IMPAIRMENT:   Patient report? No  Concerns raised by family members, friends, caretakers or others? No     CARDIAC RISK FACTORS:  Smoker: No  Obesity: No  Diabetic: No  Known heart disease: No  Family history of heart disease: No  Sedentary lifestyle: No  Hyperlipidemia: No     Patient's questionnaire responses copied into template above by [employer], MA    Vaccine Screening Questions    1. Are you allergic to Latex? NO    2.  Have you had a serious reaction or an allergic reaction to a vaccine?  NO    3.  Currently have a moderate or severe illness, including fever? (Don't Ask if vaccine   ordered by provider same day)  NO    4.  Ever had a seizure or a brain or other nervous system problem syndrome associated with a vaccine? (DTaP/TDaP/DTP pertinent) NO    5.  Is patient receiving  any live vaccinations today? (Varicella-Chickenpox, MMR-Measles/Mumps/Rubella, Zoster-Shingles, Flumist, Yellow Fever) NOTE: oral rotavirus is exempt  NO    If YES to any of the questions above - Do NOT give vaccine.  Consult with RN or provider in clinic.  (#4 can be YES if all Live vaccine questions are answered NO)    If NO to all questions above - Patient may receive vaccine.    6.  Do you need to receive the Flu vaccine today? NO    Vaccine information sheet(s) discussed, patient/parent/guardian verbalized understanding? YES     VIS given 10/28/2016 by Minette Headland Devlyn Parish, MA.      Vaccine given today  without initial adverse effect. YES    Psychiatrist, MA

## 2016-10-28 NOTE — Progress Notes (Signed)
Robin Hart is a 76 year old female who presents for a subsequent Annual Wellness Visit.    INFORMATION GATHERING:  X  I have reviewed and confirmed the patient's past medical history, past surgical history, family history and social history in Epic     The information checked in the list below is up to date at the end of this visit:   X  Current medications and supplements (including vitamins and calcium)  X  Allergies  X  Health Risk Assessment completed by or with the patient for this visit, including Current Providers; Functional ability, safety, fall risk, & hearing loss; diet, physical activity, and health habits; Depression screening; Patient report of cognitive function via self-report & report of any concerns raised by others    The patient's Health Risk Assessment for today's visit is:   X transcribed into this encounter  X scanned into EHR (Media section of Epic)     Race and ethnicity (from Demographics): White Not Hispanic or Latino =    Current providers and suppliers   X See Care Team Section   X See also EHR Encounters for [place] Providers involved in care   X Other providers and suppliers outside [facility]: dr Vicente Males kim    PHQ-2: PHQ2 Total Score: 0  PHQ-9:       EXAM:  Vitals: BP 134/75   Pulse 53   Temp 98 F (36.7 C) (Temporal)   Resp 14   Ht 4' 11.84" (1.52 m)   Wt 118 lb 12.8 oz (53.9 kg)   SpO2 99%   BMI 23.32 kg/m    Body mass index is 23.32 kg/m.   GENERAL: no acute distress, appears healthy, comfortable, alert  GAIT: normal      COGNITION: intact        ADVANCE CARE PLANNING  End-of-life planning offered, patient declined , has already done.       ASSESSMENT:  Robin Hart was seen for a subsequent Annual Wellness Visit, including identification of risk factors & conditions that may affect her health and function in the future.    COUNSELING:     Based on today's evaluation, I recommend the following ways to improve your health or  functioning: see Guido Sander and continue exercising, try an antihistamine, get the shingles vaccine.      You have the following risk factors and/or medical conditions for which there are recommended ways (included in the list) to help you stay as healthy as possible:  1. Recent fall  2. allergies    Here are measures that our medical record shows may be recommended for you for screening & prevention, and when each is recommended:   Health Maintenance   Topic Date Due   . Tetanus Vaccine  03/05/1960   . Zoster Vaccine  03/05/2001   . Pneumococcal Vaccine (1 of 2 - PCV13) 03/05/2006   . Influenza Vaccine (1) 01/02/2017   . Depression Screening (PHQ-2)  10/28/2017   . Lipid Disorders Screening  08/05/2020   . Colorectal Cancer Screening (FOBT/FIT)  06/02/2025   . Osteoporosis Screening  Completed     Please plan to have a Subsequent Annual Wellness Visit next year     ----------------------------------------------------------------------------------------------  This is a record of separately identifiable services which were rendered today for this patient.      Robin Hart a 76 year old female presents today for trouble with the eyes.  1) allergies. Known glaucoma, she thinks allergies make it worse.  Constant nasal drainage, congestion, itchy ears, sometimes eye discharge.   Can't wear contacts  Going to see ENT in about 2 weeks.     neti pot and hasn't tried flonase yet because of the glaucoma.   Hasn't tried daily antihistamine yet, not sure which.   Putting some topical hydrocort on the ears.     2) fungal infection on the toenail?    3) fall, swinging her granddaughter, spun and fell onto her tailbone  Then backing up onto a stool, she missed the stool and fell on her tailbone  No vertigo (spinning)  Feels like she does not balance well if standing just on the R leg    I have reviewed the patient's medical history in detail and updated the computerized patient record.    ROS: IBS symptoms are stable.      Vitals:    10/28/16 0953   BP: 134/75   BP Cuff Size: Regular   BP Site: Left Arm   BP Position: Sitting   Pulse: 53   Resp: 14   Temp: 98 F (36.7 C)   TempSrc: Temporal   SpO2: 99%   Weight: 118 lb 12.8 oz (53.9 kg)   Height: 4' 11.84" (1.52 m)     no apparent distress  Ears: some flaking in the R, normal L ear  Skin: the R great toe has some discoloration at the corner, no thickening, some ridging and a dent or pit in the middle (trauma?) the nailbase appears health and other nails not affected  MSK: as before, she has weakness in the R great toe extension  Seated hip ABduction is 5/5 and symmetric  Monofilament on the feet is normal  No babinski    A/P:     [name] was seen today for allergies and fall.    Diagnoses and all orders for this visit:    Chronic allergic rhinitis, unspecified seasonality, unspecified trigger: discussed use of nonsedating antihistamines. If review w/ Dr Augustin Coupe suggests allergy referral would be helpful, I will make one.     Weakness of toe, History of fall: both falls were when doing very active things--plausible for a healthy young adult to stumble on either occ, but her minor motor deficits on the R may be contributing. We discussed side-to-side and [place]vine walking to improve lateral strength. She'll review w/ Dr Guido Sander next week.

## 2016-11-02 ENCOUNTER — Encounter (INDEPENDENT_AMBULATORY_CARE_PROVIDER_SITE_OTHER): Payer: Medicare PPO | Admitting: Orthopaedic Surgery

## 2016-11-04 ENCOUNTER — Telehealth (INDEPENDENT_AMBULATORY_CARE_PROVIDER_SITE_OTHER): Payer: Self-pay | Admitting: Otolaryngology

## 2016-11-07 ENCOUNTER — Ambulatory Visit: Payer: Medicare PPO | Attending: Gastroenterology | Admitting: Rehabilitative and Restorative Service Providers"

## 2016-11-07 DIAGNOSIS — M6289 Other specified disorders of muscle: Secondary | ICD-10-CM | POA: Insufficient documentation

## 2016-11-07 DIAGNOSIS — N3941 Urge incontinence: Secondary | ICD-10-CM | POA: Insufficient documentation

## 2016-11-07 DIAGNOSIS — K5904 Chronic idiopathic constipation: Secondary | ICD-10-CM | POA: Insufficient documentation

## 2016-11-07 NOTE — Telephone Encounter (Signed)
30 min return-long appointment scheduled

## 2016-11-07 NOTE — Progress Notes (Signed)
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  Reports she continues with difficulty with urinary urgency.  She feels her bowel movements have also been better and she attributes this to her nausea being better.  The patient is reporting 6/10 level of pain/percieved severity of symptoms, on a 0-10 Numeric Pain Distress Scale.     Skilled services/interventions provided:Therapeutic activities for 35 minutes.   Therapeutic activities: Review urge suppression strategies, bladder retraining and general down training ,  general LE stretching including hamstring and adductor stretches.  Reviewed defecation mechanics with abdominal brace with general down ward pressure and lengthening of the pelvic floor muscles.  Reviewed importance of consistency with self care, urge suppression and bladder retraining program.    Patient's response to therapy: Independent with self care for home.    Patient education/instruction: the patient was able to return demonstrate the exercises accurately after instructions    Change in impairments:   Observation: Skin integrity : intact without abnormality.  Vaginal vault size: WNL, Rectal Canal Size  Not assessed this date, Sitting Tolerance limited due to back and tailbone region pain, Posture WNL except sitting off to the side due to tailbone region  pain  Impairments:       Range of motion/Quality of motion/Recruitment Pattern:  Able to perform PF muscle lift, release and excursion WNL with noted coordinated contraction        Muscle Strength: [place]: 06/06/08/10 (MMT/Hold in seconds/repetitions at hold/quick)   Palpation:2/5 restriction to mobility throughout the perineum.     Prolapse/Tissue Laxity:noted general perineum descent with excursion   External Anal Sphincter Contraction: intact with noted ability to contraction and release and allow for excursion   Lower Quadrant Screen: Will complete at future sessions as needed        Functional improvement/progress toward each goals:   Bowel movements are incomplete 50% of the time  Bowel movements vary from 1x per 3-4 days to 3x per day.    She is unable to delay her urinary urge to void longer than 10 minutes 50% of the time.    She has urinary leakage on the way to the bathroom 50% of the time.  Sitting over 60 minutes results in increasing pain  Independent with self care progression.      Functional limitation(s) remaining requiring continued skilled services:  Will continue towards stated goals with her self care program   Able to sit for at least 42mn without pain over 2/10 by 01/01/17 MET  Independent with self care program to reduce the reoccurrence of symptoms by 01/01/17 MET  Void interval of 1x per 2 hours by 01/01/17  Able to delay urinary urge without urinary leakage for  at least 337mutes by 01/01/17  Regular Bowel Movements at  least 1x per 2 days without need for manual assistance or pain over 2/10 by 01/01/17    Place the Severity Modifier in the box to the left of the appropriate code:   Functional Limitation Code  Current Status  G Code  Goal Status  G Code  D/C   Status  G Code    Self Care Self care functional limitation   G8987  CI G8988  CJ T7711      Severity Modifiers Key    CH 0% impaired, limited or restricted    CI At least 1% but less than 20% impaired, limited or restricted    CJ At  least 20 %but less than 40% impaired, limited or restricted    CK At least 40% but less than 60%impaired, limited or restricted    CL At least 60% but less than 80% impaired, limited or restricted    CM At least 80% but less than 100% impaired, limited or restricted    CN 100% impaired, limited or restricted        Current G Code Modifier and justification as to selection per patient report    Any changes in plan of treatment: Discharge to self care at this time.  The patient should continue to progress well with performing her self care    Daiva Eves, PT

## 2016-11-09 ENCOUNTER — Encounter (INDEPENDENT_AMBULATORY_CARE_PROVIDER_SITE_OTHER): Payer: Self-pay | Admitting: Otolaryngology

## 2016-11-09 ENCOUNTER — Encounter (INDEPENDENT_AMBULATORY_CARE_PROVIDER_SITE_OTHER): Payer: Medicare PPO | Admitting: Otolaryngology

## 2016-11-09 ENCOUNTER — Ambulatory Visit: Payer: Medicare PPO | Attending: Otolaryngology

## 2016-11-09 ENCOUNTER — Ambulatory Visit (INDEPENDENT_AMBULATORY_CARE_PROVIDER_SITE_OTHER): Payer: Medicare PPO | Admitting: Otolaryngology

## 2016-11-09 VITALS — BP 132/90 | HR 56 | Ht 59.84 in | Wt 118.0 lb

## 2016-11-09 DIAGNOSIS — J324 Chronic pansinusitis: Secondary | ICD-10-CM | POA: Insufficient documentation

## 2016-11-09 DIAGNOSIS — J300 Vasomotor rhinitis: Secondary | ICD-10-CM

## 2016-11-09 DIAGNOSIS — H6063 Unspecified chronic otitis externa, bilateral: Secondary | ICD-10-CM

## 2016-11-09 DIAGNOSIS — Z6823 Body mass index (BMI) 23.0-23.9, adult: Secondary | ICD-10-CM

## 2016-11-09 MED ORDER — AZELASTINE HCL 0.1 % NA SOLN
1.0000 | Freq: Every day | NASAL | 11 refills | Status: DC
Start: 2016-11-09 — End: 2017-02-17

## 2016-11-09 NOTE — Progress Notes (Signed)
11/09/2016        OFFICE VISIT      PATIENT:  Robin Hart  DOB:  05-07-1940  MRN:  [id number]      CHIEF COMPLAINT/HPI:  Patient is a 76 year old female who presented with nasal drainage for 2 years.  Symptoms are   moderate,continuous, and unchanged.  Most recent sinus infection was 07/2016 managed with   Augmentin.  Patient had 8 episodes last year.  She has exposure to 3 grandchildren. Allergy   testing is negative.  Symptoms initially improved with washing her hands, saline nasal sprays,   and antibiotics, including Z-pack.  She started using a Neti pot.  Her ophthalmologist is referring   her to ENT for mucus in her eye.  She has eye duct plugs.  She has a humidified CPAP for   OSA.  Associated symptoms include nasal congestion, anterior nasal drainage, and posterior   nasal drainage.  She denies headache, facial tenderness, nasal obstruction, and fever.                   Prior Imaging:   CXR 08/06/15:  NAD                                            Patient also reported itchy ears for a year.  Symptoms are moderate, continuous, and   unchanged.  She uses Q-tips.  Symptoms did not significantly improve with hydrocortisone   cream.  She denies otalgia, otorrhea, tinnitus, vertigo, and hearing loss.                  Prior testing: none                          Audiogram:                           CT temporal bone:                           MRI brain/internal auditory canals:                   HEARING AID USE:  None     PMHX:  Past Medical History:   Diagnosis Date    Arthritis     Cancer ([place])     skin cell carcinoma     Constipation     Diverticulosis     Glaucoma     H. pylori infection     Headache     History of diarrhea     History of irritable bowel syndrome     Hypertension     Lipidemia     Osteoporosis      PSHX:  Past Surgical History:   Procedure Laterality Date    Basal cell carcinoma excision      BLADDER SUSPENSION      BREAST BIOPSY      COLONOSCOPY STOMA DX INCLUDING COLLJ  SPEC SPX      TOTAL ABDOMINAL HYSTERECT W/WO RMVL TUBE OVARY       FHX: @HXFAMILY @  SHX:  Social History     Social History    Marital status: N/A     Spouse name: N/A    Number of children: N/A  Years of education: N/A     Occupational History    Not on file.     Social History Main Topics    Smoking status: Never Smoker    Smokeless tobacco: Never Used    Alcohol use No    Drug use: No    Sexual activity: Not Currently     Other Topics Concern    Not on file     Social History Narrative    No narrative on file       ALLERGIES:  is allergic to caffeine.    MEDICATIONS:  has a current medication list which includes the following prescription(s): azelastine hcl, latanoprostene bunod, timolol maleate, and zoster vac recomb adjuvanted.       REVIEW OF SYSTEMS:    Constitutional: denies fatigue, fever, chills, sweats   Eye: negative  ENT:  + sinusitis, nasal congestion, nasal drainage, post-nasal drainage, itchy ears.  Denies excessive cerumen, hearing loss, fullness in ears, infections, otalgia, tinnitus, vertigo, noise exposure, Nose/Sinus: anosmia/hyposmia, epistaxis, facial pain, nasal obstruction, sneezing, Throat: taste change, voice change, hoarseness, dysphagia, lump in throat, odynophagia, sore tongue, mouth sores, pharyngitis, snoring, tooth pain   Cardiovascular: negative   Respiratory:  negative  Gastrointestinal:  Negative  Endocrine:  negative    Neurological: + OSA  Psychiatric:  negative   Genitourinary:  negative .  Integumentary: negative and no rashes nor lesions of concern   Musculoskeletal: negative   Hematologic: negative  Immunologic: no environmental allergies       PHYSICAL EXAMINATION:  Vitals:    11/09/16 0953   BP: 132/90   BP Cuff Size: Regular   BP Site: Left Arm   BP Position: Sitting   Pulse: 56   SpO2: 97%   Weight: 118 lb (53.5 kg)   Height: 4' 11.84" (1.52 m)        General: Awake and alert. Well developed and participated in the patient interview appropriately.  Eyes:  Pupils equal, round and reactive to light.  Extraocular movements intact.  Ears: visualized by microscopy               Right: Pinna unremarkable.  External ear canal without masses or skin abnormality. Free of cerumen. Tympanic membrane intact and without retraction or perforation. Middle ear without fluid.                        Left: Pinna unremarkable.  External ear canal without masses or skin abnormality. Free of cerumen. Tympanic membrane intact and without retraction or perforation. Middle ear without fluid.                            Nose:  Nasal mucosa normal with clear mucus.  Posterior DNS to right, with no polyps or lesions.  Mild turbinate hypertrophy.  OP: Clear without masses or ulcerations. Normal lips and gums.  Normal hard palate, soft palate, and posterior pharynx. No parotid or submandibular gland tenderness, masses, or swelling.   HP:  Clear without mucosal abnormalities or masses  Neck: Supple and without masses, normal carotid pulses, midline trachea, normal thyroid without enlargement, tenderness, and masses.  Lymphatic: normal submandibular, cervical, and supraclavicular lymph nodes    Skin: No rashes   Neuro:  CN II-XII grossly intact (VII Movements of facial expression bilaterally intact and symmetrical)  Cardiovascular: Regular rate and rhythm, normal carotid pulses  Lungs: normal effort  Extremities: No edema  Procedure note:  Diagnostic Nasal Endoscopy   Consent obtained. Procedure discussed. Questions answered. Correct patient identified.    The patient underwent insertion of a rigid 0 degree endoscope into the nasal vault,  bilaterally. The endoscope was passed along the floor of the nasal vault and then also  passed along the upper portion of the nasal vault first on the left side and then on the   right side.     Mucosa bilateral normal  Septum deviated to the right  Inferior turbinate bilateral mild hypertrophy  Middle turbinate  bilateral normal  Inferior meatus bilateral  clear  Middle meatus bilateral no polyps, pus, right clear-white mucus cultured  Maxillary sinus   Ethmoid sinus   Frontal sinus   Sphenoethmoid recess   Nasopharynx clear    Anesthetic: Lidophen nasal spray  Patient tolerated procedure well.      IMPRESSION/PLAN: Chronic sinusitis, Chronic otitis externa - likely dermatitis  Nasal irrigations BID, instructions provided  Prescribe Anti-histamine nasal spray (Astepro) for nasal drainage, nasal congestion  Check sinus culture  Check ear culture  Use apple cider vinegar/alcohol for otitis externa  Dr. Manuella Ghazi info provided for eye drainage opinion      Kirke Shaggy, MD      Cc:   Samella Parr, MD  Renelda [place], [place] [address]

## 2016-11-09 NOTE — Patient Instructions (Signed)
Patient Education     For itchy ears  1/2 tsp apple cider vinegar  1/2 tsp alcohol (may substitute with water)    4 drops in each ear 2-3 times a day       Anatomy of the Ear    The ear is a complex and delicate organ. It collects sound waves so you can hear the world around you. The ear also has a second function--it helps you keep your balance. Your ear can be divided into 3 parts. The outer ear and middle ear help collect and amplify sound. The inner ear converts sound waves to messages that are sent to the brain. The inner ear also senses the movement and position of your head and body so you can maintain your balance and see clearly, even when you change positions.  The mastoid bone surrounds the middle ear. The external ear collects sound waves. The ear canal carries sound waves to the eardrum. The eardrum vibrates from sound waves, setting the middle ear bones in motion. The middle ear bones (ossicles) vibrate, transmitting sound waves to the inner ear. When the ear is healthy, air pressure remains balanced in the middle ear. The eustachian tube helps control air pressure in the middle ear.The semicircular canals help maintain balance. The vestibular nerve carries balance signals to the brain. The auditory nerve carries sound signals to the brain. The cochlea picks up sound waves and makes nerve signals.    Date Last Reviewed: 01/03/2015   2000-2017 [employer]. [address] [place] [address]. All rights reserved. This information is not intended as a substitute for professional medical care. Always follow your healthcare professional's instructions.      Nasal Irrigation  Nasal irrigations 2x/day  Milta Deiters med sinus rinse kit     Astepro (azelastine) 1-2 sprays in each nostril once daily (anti-histamine spray to reduce congestion, nasal drainage, allergies)        A. Prepare irrigating solution as follows:    Mix:1/2 tsp table salt and 1/2 tsp baking soda  (Store in dry clean  container)    For Treatment: Put one teaspoon of above mix in two cups of warm water. (two cups= one pint)    B. Treatment Protocol:    Use soft rubber ear bulb syringe    Fill the bulb syringe with the solution.    Squeeze the bulb to force half the solution into one nostril and the rest into the other nostril using mild to moderate force.    Let the solution run out of your nose into the sink, then blow your nose.    Repeat but this time sniffle and snort some of the solution into the back of your throat and spit it out. Continue until all of the solution is used up.    Renato Battles your nose again and use the nasal sprays prescribed by your doctor.    D.  Repeat irrigation procedure twice daily.    NOTE:  If the solution stings or bums your nose, only use 1/2 teaspoon of the mix with two cups water. Do not use cold or hot water because extreme temperatures will also make your nose feel like it is burning.    Post-Nasal Drip     Patient Health Information      r  o        Glands in your nose and throat continually produce mucus (one to two quarts a day). Mucus moistens and cleans the  nasal membranes, humidifies air, traps and clears inhaled foreign matter, and fights infection. Although it is normally swallowed unconsciously, the feeling of it accumulating in the throat or dripping from the back of your nose is called post-nasal drip. This sensation can be caused by excessively thick secretions or by throat muscle and swallowing disorders.  WHAT CAUSES ABNORMAL SECRETIONS?  Thin secretions:  Increased thin clear secretions can be due to colds and flu, allergies, cold temperatures, bright lights, certain foods/spices, pregnancy, and other hormonal changes. Various drugs (including birth control pills and high blood pressure medications) and structural abnormalities can also produce increased secretions. These abnormalities might include a deviated or irregular nasal septum (the cartilage and bony dividing wall that  separates the two nostrils).  Thick secretions:  Increased thick secretions in the winter often result from dryness in heated buildings and homes. They can also result from sinus or nose infections and allergies, especially to foods such as dairy products. If thin secretions become thick, and turn green or yellow, it is likely that a bacterial sinus infection is developing. In children, thick secretions from one side of the nose can mean that something is stuck in the nose such as a bean, wadded paper, or piece of toy. If these symptoms are observed, seek a physician for examination.  HOW IS SWALLOWING AFFECTED?  Swallowing problems may result in accumulation of solids or liquids in the throat that may complicate or feel like post-nasal drip. When the nerves and muscles in the mouth, throat, and food passage (esophagus) arent interacting properly, overflow secretions can spill into the voice box (larynx) and breathing passages (trachea and bronchi), causing hoarseness, throat clearing, or coughing.  Several factors contribute to swallowing problems:   With age, swallowing muscles often lose strength and coordination, making it difficult for even normal secretions to pass smoothly into the stomach.   During sleep, swallowing occurs much less frequently, and secretions may gather. Coughing and vigorous throat clearing are often needed upon waking.   When nervous or under stress, throat muscles can trigger spasms that make it feel as if there is a lump in the throat. Frequent throat clearing, which usually produces little or no mucus, can make the problem worse by increasing irritation.   Growths or swelling in the food passage can slow or prevent the movement of liquids and/or solids.  Swallowing problems may also be caused by gastroesophageal reflux disease (GERD). This is a backup of stomach contents and acid into the esophagus or throat. Heartburn, indigestion, and sore throat are common symptoms. GERD may be  aggravated by lying down, especially following eating. Hiatal hernia, a pouch-like tissue mass where the esophagus meets the stomach, often contributes to the reflux.  HOW IS THE THROAT AFFECTED?  Post-nasal drip often leads to a sore, irritated throat. Although there is usually no infection, the tonsils and other tissues in the throat may swell. This can cause discomfort or a feeling that there is a lump in the throat. Successful treatment of the post-nasal drip will usually clear up these throat symptoms.  HOW IS IT TREATED?  A correct diagnosis requires a detailed ear, nose, and throat exam, and possibly laboratory, endoscopic (procedures that use a tube to look inside the body), and x-ray studies. Treatment varies according to the following causes:   Bacterial infections are treated with antibiotics. These drugs may only provide temporary relief. In cases of chronic sinusitis, surgery to open the blocked sinuses may be required.   Allergies are  managed by avoiding the causes. Antihistamines and decongestants, cromolyn and steroid (cortisone type) nasal sprays, and other forms of steroids may offer relief. Immunotherapy, either by shots or sublingual (under the tongue drops) may also be helpful. However, some older, sedating antihistamines may dry and thicken post-nasal secretions even more; newer nonsedating antihistamines, available by prescription only, do not have this effect. Decongestants can aggravate high blood pressure, heart, and thyroid disease. Steroid sprays may be used safely under medical supervision. Oral and injectable steroids rarely produce serious complications in short-term use. Because significant side-effects can occur, steroids must be monitored carefully when used for more than one week.   Gastroesophageal reflux is treated by elevating the head of the bed six to eight inches, avoiding foods and beverages for two to three hours before bedtime, and eliminating alcohol and caffeine from  the daily diet. Antacids such as Maalox, Mylanta, Gaviscon, and drugs that block stomach acid production such as Zantac, Tagamet, or Pepcid) may be prescribed. If these are not successful, stronger medications can be prescribed. Trial treatments are usually suggested before x-rays and other diagnostic studies are performed.  General measures that allow mucus secretions to pass more easily may be recommended when it is not possible to determine the cause. Many people, especially older persons, need more fluids to thin out secretions. Drinking more water, eliminating caffeine, and avoiding diuretics (medications that increase urination) will help. Mucous-thinning agents such as guaifenesin (Humibid, Robitussin) may also thin secretions. Nasal irrigations may alleviate thickened secretions. These can be performed two to four times a day either with a nasal douche device or a Water Pik with a nasal irrigation nozzle. Warm water with baking soda or salt ( to 1 tsp. to the pint) or Alkalol, a nonprescription irrigating solution (full strength or diluted by half warm water), may be helpful. Finally, use of simple saline (salt) nonprescription nasal sprays (e.g., Ocean, Ayr, or Nasal) to moisten the nose is often very beneficial.  SINUS CONDITIONS  Sinuses are air-filled cavities in the skull. They drain into the nose through small openings. Blockages in the openings from swelling due to colds, flu, or allergies may lead to acute sinus infection. A viral cold that persists for 10 days or more may have become a bacterial sinus infection. This infection may increase post-nasal drip. If you suspect that you have a sinus infection, you should see your physician to see if it needs antibiotic treatment.  Chronic sinusitis occurs when sinus blockages persist, causing the lining of the sinuses to swell further. Polyps (growths in the nose) may develop with chronic sinusitis. Patients with polyps tend to have  irritating, persistent post-nasal drip. Evaluation by an otolaryngologist may include an exam of the interior of the nose with a fiberoptic scope and CAT scan x-rays. If medication does not relieve the problem, surgery may be recommended.  Vasomotor Rhinitis describes a nonallergic hyperirritable nose that feels congested, blocked, or wet.

## 2016-11-10 ENCOUNTER — Encounter (INDEPENDENT_AMBULATORY_CARE_PROVIDER_SITE_OTHER): Payer: Self-pay | Admitting: Orthopaedic Surgery

## 2016-11-10 ENCOUNTER — Ambulatory Visit (INDEPENDENT_AMBULATORY_CARE_PROVIDER_SITE_OTHER): Payer: Medicare PPO | Admitting: Orthopaedic Surgery

## 2016-11-10 VITALS — BP 121/70 | HR 72 | Ht 60.0 in | Wt 118.0 lb

## 2016-11-10 DIAGNOSIS — M5441 Lumbago with sciatica, right side: Secondary | ICD-10-CM

## 2016-11-10 DIAGNOSIS — Z6823 Body mass index (BMI) 23.0-23.9, adult: Secondary | ICD-10-CM

## 2016-11-10 DIAGNOSIS — M9905 Segmental and somatic dysfunction of pelvic region: Secondary | ICD-10-CM

## 2016-11-10 DIAGNOSIS — M9906 Segmental and somatic dysfunction of lower extremity: Secondary | ICD-10-CM

## 2016-11-10 DIAGNOSIS — M47817 Spondylosis without myelopathy or radiculopathy, lumbosacral region: Secondary | ICD-10-CM

## 2016-11-10 DIAGNOSIS — M999 Biomechanical lesion, unspecified: Secondary | ICD-10-CM | POA: Insufficient documentation

## 2016-11-10 DIAGNOSIS — G8929 Other chronic pain: Secondary | ICD-10-CM

## 2016-11-10 DIAGNOSIS — M9903 Segmental and somatic dysfunction of lumbar region: Secondary | ICD-10-CM

## 2016-11-10 DIAGNOSIS — M5417 Radiculopathy, lumbosacral region: Secondary | ICD-10-CM

## 2016-11-10 LAB — EAR C/S W/GRAM: Gram Smear: NONE SEEN

## 2016-11-10 NOTE — Progress Notes (Signed)
11/10/2016    PATIENT: Robin Hart  [id number]    PRIMARY CARE PROVIDER:  Samella Parr, MD    REASON FOR VISIT/CC:  Bilateral low back pain but also discomfort in the right hip and right leg    HPI: Robin Hart is a 75 year old female who is referred by Dr. Rachel Moulds for evaluation of chronic low back pain but also right hip and leg symptoms.  Symptoms having ongoing for years, and she is had benefit in the past with some manual treatment including some gentle chiropractic as well as exercise.  However she does feel more limited bothered by activities such as gardening or lifting alleviated by resting in bed or walking with some gentle movements.  Pain can get up to 7-8/10.  Does also have discomfort and hip symptoms after activities, but leg symptoms hip issues also do bother her at night and when she gets up.  Stretching does provide some benefit for this.  He said a few falls in the past 3 months and feels perhaps this could be due to some weakness on the right with right leg rarely buckling when she's walking.  Mostly feels tight and achy.  He has done some pelvic therapies in the past, and chiropractic as mentioned as well as home exercises included some gentle yoga, but has not had any specific physical therapy for low back.  Is also not had advanced imaging of the lumbar spine with MRI all directions have been done showing degeneration including some facet arthropathy in the lower lumbar spine and some disc height loss.    PMH/PSH, SH, [facility], current medications, allergies have been reviewed today and are documented in detail in the electronic medical record. Pertinent positives are described above.    REVIEW OF SYSTEMS:  14 point review of systems is otherwise normal except as described above.    PHYSICAL EXAMINATION:  Vital signs:   Vitals:    11/10/16 0902   BP: 121/70   BP Cuff Size: Regular   BP Site: Left Arm   BP Position: Sitting   Pulse: 72   Weight: 118 lb (53.5 kg)      Height: 5' (1.524 m)   General: Awake, alert, and oriented, no apparent distress, pleasant, and cooperative  Psychologic: Mood is euthymic, affect is congruent  Pulmonary: Nonlabored breathing  Cardiovascular: No clubbing, cyanosis, or edema  Gastrointestinal: Nondistended and nontender abdomen  Skin: No rashes, lesions, or open ulcers in the areas examined  Musculoskeletal and Neuro Exam:  Gait is overall nonantalgic.  She is able to get up on her heels and toes without gross weakness.  She does however have some hip girdle weakness with single leg stance on the right as compared to left with mild Trendelenburg.  Some slight discomfort and stiffness with lumbar extension and facet loading reproducing locally symptoms, feeling slight relief with forward flexion.  No significant pain today with gentle hip range of motion although per some mild irritation with external rotation the right side.  Also does a some discomfort but also feels relief with length testing of external rotators and abductors on the right.  Some discomfort and tightness with slump test and straight leg raise right greater than left but also involving the hamstrings feeling tightness there.  Does of tenderness and slight symptom reproduction over the right gluteal muscles.  She does have restriction and somatic dysfunction in the lower extremity.  Slight but not similar symptoms on the left.  Also  have tightness and restriction in the lower lumbar spine and paraspinals, and pelvic somatic dysfunction is noted anterior-inferior rotated on the right side.  Neurologically she does have slight hip girdle weakness in what appears to be L5 myotome a pattern slight weakness with EHL testing on the right compared to left, and also trace sensory changes in what appears to be L5 pattern on the right as compared to the left slight reduction and distal reflexes on the right.    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    He noted to help confirm our suspicion and also help guide treatment more specifically advanced imaging with MRI is reasonable and we will order this today.    We also discussed initiating conservative care working with physical therapy and modifying home exercises after review imaging before any interventions or procedures which she agrees with.    Follow-up with me after imaging.  We'll then discuss referral to therapy as well as further treatment options.    All questions answered and comfortable with plan.    For her pelvic and lower extremity somatic dysfunctions we did proceed with gentle OMT today.  See separate note    Follow-up me after imaging and in clinic in about 6-8 weeks.      Procedure Note: OSTEOPATHIC MANUAL MEDICINE  As a separate and distinct procedure today, for the patient's somatic dysfunctions (nonallopathic lesions) noted above, after informed consent, we utilized Osteopathic manual medicine with improvement in symptoms and no adverse effects.           Makala Fetterolf B.HGuido Sander, D.O.   Board certified - Physical Medicine & Rehabilitation      Portions of this document were created with voice-recognition software and may contain occasional errant or misspelled words.

## 2016-11-10 NOTE — Progress Notes (Signed)
Robin Hart presents today for evaluation of bilateral low back and right hip pain that ongoing for years.     Referred by Dr. Rachel Moulds  History of 3 falls in the last 3 months, she is not sure if it's related to weakness.   History of osteopetrosis    Pain is located at bilateral low back, right lateral aspect of her hip, in her right calf, and in her big toe  Pain is described as achy and tight    Low back pain is rated 7-8/10 when aggravated with gardening or lifting. Alleviated with resting in bed or walking.   The calf and foot pain seem to corolate to her hip pain   Treatments have included:   - Medications: aleve prn  - Injections: none  - Therapies:past PHYSICAL THERAPY for IBS and urinary incontience, she does some exerciess at home, past chiropractic.  - Surgery: none  Diagnostic studies (such as EMG and imaging) include:   XR L Spine

## 2016-11-13 LAB — RESPIRATORY SINUS C/S W/GRAM: Gram Smear: NONE SEEN

## 2016-11-14 ENCOUNTER — Telehealth (INDEPENDENT_AMBULATORY_CARE_PROVIDER_SITE_OTHER): Payer: Self-pay | Admitting: Otolaryngology

## 2016-11-14 DIAGNOSIS — J324 Chronic pansinusitis: Secondary | ICD-10-CM

## 2016-11-14 NOTE — Telephone Encounter (Signed)
Ear cx 11/09/16:  2+ Mixed cutaneous flora (>=3 isolates), No fungus isolated in 4 days    Sinus cx 11/09/16:  3+ Normal respiratory flora       Try apple cider vinegar solution for itchy ears  Order CT sinus - [facility] Radiology, check nasal/sinus anatomy

## 2016-11-15 ENCOUNTER — Ambulatory Visit (INDEPENDENT_AMBULATORY_CARE_PROVIDER_SITE_OTHER): Payer: Medicare PPO

## 2016-11-21 ENCOUNTER — Encounter (INDEPENDENT_AMBULATORY_CARE_PROVIDER_SITE_OTHER): Payer: Self-pay | Admitting: Orthopaedic Surgery

## 2016-11-21 ENCOUNTER — Encounter (HOSPITAL_BASED_OUTPATIENT_CLINIC_OR_DEPARTMENT_OTHER): Payer: Medicare PPO | Admitting: Rehabilitative and Restorative Service Providers"

## 2016-11-21 DIAGNOSIS — G8929 Other chronic pain: Secondary | ICD-10-CM

## 2016-11-21 DIAGNOSIS — M48061 Spinal stenosis, lumbar region without neurogenic claudication: Secondary | ICD-10-CM

## 2016-11-21 DIAGNOSIS — M5417 Radiculopathy, lumbosacral region: Secondary | ICD-10-CM

## 2016-11-21 DIAGNOSIS — M4310 Spondylolisthesis, site unspecified: Secondary | ICD-10-CM

## 2016-11-22 NOTE — Telephone Encounter (Signed)
Called to reivew imaging.      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     She also has severe facet arthropathy at L3-L4 and L5-S1.     We can start with PHYSICAL THERAPY and if she has pain in her right leg we can consider a TRANSFORAMINAL EPIDURAL STEROID INJECTION.     If more axial back pain we can consider a MEDIAL BRANCH BLOCK.     Schedule f/u 6-8 weeks after starting PHYSICAL THERAPY.     She would like to start PHYSICAL THERAPY for now. Scheduled for for 6 weeks out where we can reassess.   Pended PHYSICAL THERAPY

## 2016-11-23 ENCOUNTER — Ambulatory Visit (INDEPENDENT_AMBULATORY_CARE_PROVIDER_SITE_OTHER): Payer: Self-pay

## 2016-11-25 DIAGNOSIS — M48061 Spinal stenosis, lumbar region without neurogenic claudication: Secondary | ICD-10-CM | POA: Insufficient documentation

## 2016-11-25 DIAGNOSIS — M4310 Spondylolisthesis, site unspecified: Secondary | ICD-10-CM | POA: Insufficient documentation

## 2016-11-28 ENCOUNTER — Encounter (INDEPENDENT_AMBULATORY_CARE_PROVIDER_SITE_OTHER): Payer: Self-pay

## 2016-11-28 ENCOUNTER — Telehealth (INDEPENDENT_AMBULATORY_CARE_PROVIDER_SITE_OTHER): Payer: Self-pay | Admitting: Orthopaedic Surgery

## 2016-11-28 NOTE — Telephone Encounter (Signed)
Sent patient e-care message with a list and contact information about the PT offices in [place] we frequently use.

## 2016-11-28 NOTE — Telephone Encounter (Signed)
Referral Request:    Reason for referral: Physical Therapy    Additional information: Patient called wanting to speak with Sharyn Lull about her referral for PT. She just wants to discuss what recommendations does the doctor's office has for her as far as PT places go. Please call her back.   Return Call: General message okay

## 2016-11-29 NOTE — Telephone Encounter (Signed)
Called and let her know Dr. Guido Sander recommended [facility] at [phone] near [facility]    Or RET in [place]    She started with [facility] where Ramond Marrow recommended.   She will f/u in one month.

## 2016-11-29 NOTE — Telephone Encounter (Signed)
PT referral faxed, confirmation received

## 2016-11-30 ENCOUNTER — Ambulatory Visit (INDEPENDENT_AMBULATORY_CARE_PROVIDER_SITE_OTHER): Payer: Medicare PPO

## 2016-12-07 ENCOUNTER — Encounter (HOSPITAL_BASED_OUTPATIENT_CLINIC_OR_DEPARTMENT_OTHER): Payer: Medicare PPO | Admitting: Rehabilitative and Restorative Service Providers"

## 2016-12-07 LAB — WOUND FUNGAL W/DIRECT EXAM: Stain For Fungus: NONE SEEN

## 2016-12-09 ENCOUNTER — Ambulatory Visit (INDEPENDENT_AMBULATORY_CARE_PROVIDER_SITE_OTHER): Payer: Medicare PPO

## 2016-12-11 ENCOUNTER — Telehealth (INDEPENDENT_AMBULATORY_CARE_PROVIDER_SITE_OTHER): Payer: Self-pay | Admitting: Otolaryngology

## 2016-12-11 NOTE — Telephone Encounter (Signed)
CT sinus 12/09/16:  Mild mucosal thickening paranasal sinuses, DNS to right    Chronic sinusitis  Nasal irrigations BID  Add OTC Flonase for inflammation  Continue azelastine for nasal drainage  Schedule appt for CT sinus review and management options

## 2016-12-19 ENCOUNTER — Encounter (HOSPITAL_BASED_OUTPATIENT_CLINIC_OR_DEPARTMENT_OTHER): Payer: Medicare PPO | Admitting: Rehabilitative and Restorative Service Providers"

## 2016-12-29 ENCOUNTER — Ambulatory Visit (INDEPENDENT_AMBULATORY_CARE_PROVIDER_SITE_OTHER): Payer: Medicare PPO | Admitting: Orthopaedic Surgery

## 2016-12-29 ENCOUNTER — Encounter (INDEPENDENT_AMBULATORY_CARE_PROVIDER_SITE_OTHER): Payer: Self-pay | Admitting: Orthopaedic Surgery

## 2016-12-29 VITALS — BP 128/70 | HR 72

## 2016-12-29 DIAGNOSIS — M5441 Lumbago with sciatica, right side: Secondary | ICD-10-CM

## 2016-12-29 DIAGNOSIS — G8929 Other chronic pain: Secondary | ICD-10-CM

## 2016-12-29 DIAGNOSIS — M48061 Spinal stenosis, lumbar region without neurogenic claudication: Secondary | ICD-10-CM

## 2016-12-29 DIAGNOSIS — M4310 Spondylolisthesis, site unspecified: Secondary | ICD-10-CM

## 2016-12-29 DIAGNOSIS — M9983 Other biomechanical lesions of lumbar region: Secondary | ICD-10-CM

## 2016-12-29 DIAGNOSIS — M5417 Radiculopathy, lumbosacral region: Secondary | ICD-10-CM

## 2016-12-29 NOTE — Progress Notes (Signed)
Robin Hart presents today for evaluation of bilateral low back and right hip pain (radiates to calf and foot)    Reports: started PHYSICAL THERAPY at [facility] for 4 visits thus far. First 3 visits were forcused on thoracic breathing and then 2 exercises with minimal movement. No difference thus far.     Low back pain is rated 7-8/10 when aggravated with gardening or lifting. Alleviated with resting in bed or walking. Her pain has been fluctuating from a 6-9/10.     Called her to review MRI and discussed possible ESI or MEDIAL BRANCH BLOCK.    Last seen: 11/10/16  Treatments have included:    Medications: aleve prn   Injections: none   Therapies:past PHYSICAL THERAPY for IBS and urinary incontience, she does some exerciess at home, past chiropractic.   Surgery: none  Diagnostic studies (such as EMG and imaging) include:   XR L Spine 09/22/16  MRI L Spine 11/15/16

## 2016-12-31 NOTE — Progress Notes (Signed)
12/29/2016    Burr Medico  [id number]    IDENTIFYING DATA:  Robin Hart is a pleasant 76 year old female who was last seen in clinic on 11/10/2016.  She presents today for reevaluation of back pain and also discomfort in the right hip and leg    INTERIM HISTORY:  Ahria returns to follow-up regarding bilateral low back pain and also referral towards right hip calf and foot intermittently.  Started PHYSICAL THERAPY at [facility] for 4 visits thus far. First 3 visits were forcused on thoracic breathing and then 2 exercises with minimal movement. No difference thus far.  Not that happy overall and feels she needs more hands-on and more guidance that she is quite motivated to do exercises and get better.  Also doesn't want to waste or time.  Low back pain is rated 7-8/10when aggravated with gardening or lifting. Alleviated with resting in bed or walking. Her pain has been fluctuating from a 6-9/10.   We had contacted her to discuss her MRI results which are reviewed below.  She is aware that she does have degenerative changes, and also areas that could easily cause her referral distally.  We discussed the possibility of diagnostic medial branch blocks, possible epidural injection at this point however she is more interested in continuing with conservative treatment and exercises to see her she does, but if she has pain that limits her overall improvement and progress she would be interested in possible intervention.  Not interested in surgical management.    PMH/PSH/MEDICATIONS/FAMILY MEDICAL HISTORY/SOCIAL HISTORY:  See chart. Pertient changes described above.     MEDICATIONS/ALLERGIES:  See chart. No changes.     REVIEW OF SYSTEMS:  14 point review of systems negative other than mentioned above.    PHYSICAL EXAMINATION:  General: Awake, alert, and oriented, no apparent distress, pleasant, and cooperative  Vitals: Blood pressure 128/70, pulse 72.There is no height or weight on file to calculate  BMI.  Neurologic/Musculoskeletal: Gait and movements are perhaps slightly antalgic.  She does of discomfort today in the low back with extension and also sidebending towards the right side which does trigger some symptoms distally on the right.  No new specific changes with regards to neurological examination lower limbs, and equivocal neural tension signs only some slight discomfort low back buttock on the right side negative on the left.     IMAGING:  MRI lumbar spine 11/15/2016:  IMPRESSION:   1. Grade 1 spondylolisthesis L4 on L5 with moderate disc degeneration   and severe facet arthropathy causes moderate right lateral recess and   foraminal stenosis impinging on the descending right L5 nerve root   and exiting right L4 nerve root.   2. Severe bilateral L5-S1 and L3-L4 facet arthropathy.   3. Grade 1 spondylolisthesis L4 on L5, without spondylolysis   identified, likely related to ligamentous laxity from facet   arthropathy.   4. Probable transverse fracture S3. Sacrum is incompletely assessed   on this exam. Recommend clinical correlation regarding the need for   formal imaging of the sacrum.   5. 1 cm multiloculated ganglion is noted in the interspinous space   between the L4 and L5 spinous processes likely related to chronic   repetitive trauma.      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 We discussed her symptoms, imaging, and response initially to work with one therapist.  2. Today we decided to about her with new referral for physical therapy and she can try a different facility.  3. If she has persistent discomfort back and leg on the right side we will likely consider transforaminal epidural injections at L4-5 and L5-S1.  If  bilateral leg symptoms female injection could be considered.  If only axial pain is predominant issue diagnostic facet blocks next step.  4. She was happy with the plan and discussion today.  5. Follow-up in clinic in about 6 weeks.  Call with any questions or concerns        Janyra Barillas Tomasa Rand, D.O.   Board certified - Physical Medicine & Rehabilitation      Portions of this document were created with voice-recognition software and may contain occasional errant or misspelled words.

## 2017-01-05 ENCOUNTER — Encounter (INDEPENDENT_AMBULATORY_CARE_PROVIDER_SITE_OTHER): Payer: Self-pay | Admitting: Otolaryngology

## 2017-01-05 ENCOUNTER — Ambulatory Visit (INDEPENDENT_AMBULATORY_CARE_PROVIDER_SITE_OTHER): Payer: Medicare PPO | Admitting: Otolaryngology

## 2017-01-05 VITALS — BP 118/70 | HR 62 | Ht 60.0 in | Wt 118.0 lb

## 2017-01-05 DIAGNOSIS — J300 Vasomotor rhinitis: Secondary | ICD-10-CM

## 2017-01-05 DIAGNOSIS — Z6823 Body mass index (BMI) 23.0-23.9, adult: Secondary | ICD-10-CM

## 2017-01-05 NOTE — Progress Notes (Signed)
Office Visit      PATIENT:  Robin Hart  DOB:  [birth year]  MRN:  [id number]    CHIEF COMPLAINT/HPI:  Patient is a 76 year old femalewho presented with nasal drainage for 2 years.  Symptoms are   moderate,continuous, and improved with nasal irrigations and Astepro.  She is here for   CT sinus review.  Most recent sinus infection was 07/2016 managed with Augmentin.  Patient   had 8episodes last year. She has exposure to 3 grandchildren. Allergy testing is negative.   Symptoms initially improved with washing her hands, saline nasal sprays, and antibiotics,   including Z-pack. She has a humidified CPAP for OSA. Associated symptoms include   clear anterior nasal drainage and posterior nasal drainage. She denies headache, facial   tenderness, nasal obstruction, nasal congestion, and fever. + glaucoma.  Using apple cider   vinegar and alcohol mixture for itchy ears.    Prior Imaging:   CXR 08/06/15: NAD  CT sinus 12/09/16:  Mild mucosal thickening paranasal sinuses, DNS to right    Ear cx 11/09/16:  2+ Mixed cutaneous flora (>=3 isolates), No fungus isolated in 4 days    Sinus cx 11/09/16:  3+ Normal respiratory flora        PMHX:  Past Medical History:   Diagnosis Date    Allergic rhinitis due to allergen     Arthritis     Cancer     skin cell carcinoma     Constipation     Diverticulosis     Glaucoma     H. pylori infection     Headache     History of diarrhea     History of irritable bowel syndrome     Hypertension     Lipidemia     Osteoporosis     Primary insomnia      PSHX:  Past Surgical History:   Procedure Laterality Date    Basal cell carcinoma excision      BLADDER SUSPENSION      BREAST BIOPSY      COLONOSCOPY STOMA DX INCLUDING COLLJ SPEC SPX      TOTAL ABDOMINAL HYSTERECT W/WO RMVL TUBE OVARY       FHX: @HXFAMILY @  SHX:  Social History     Social History    Marital status: N/A     Spouse name: N/A    Number of children: N/A    Years of education: N/A          Occupational History    Not on file.     Social History Main Topics    Smoking status: Never Smoker    Smokeless tobacco: Never Used    Alcohol use Yes    Drug use: No    Sexual activity: Not Currently     Other Topics Concern    Not on file     Social History Narrative    No narrative on file       ALLERGIES:  has No Known Allergies.    MEDICATIONS:  has a current medication list which includes the following prescription(s): azelastine hcl, latanoprostene bunod, timolol maleate, and zoster vac recomb adjuvanted.       REVIEW OF SYSTEMS:    Constitutional: denies fatigue, fever, chills, sweats   IHK:[id number]  ENT: + sinusitis, nasal drainage, post-nasal drainage, itchy ears. Denies excessive cerumen, hearing loss, fullness in ears, infections, otalgia, tinnitus, vertigo, noise exposure, Nose/Sinus: anosmia/hyposmia, epistaxis, facial pain, nasal obstruction, nasal congestion,sneezing, Throat: taste  change, voice change, hoarseness, dysphagia, lump in throat, odynophagia, sore tongue, mouth sores, pharyngitis, snoring, tooth pain   Neurological: + OSA  Immunologic: noenvironmental allergies       PHYSICAL EXAMINATION:  Vitals:    01/05/17 1352   BP: 118/70   BP Cuff Size: Regular   BP Site: Left Arm   BP Position: Sitting   Pulse: 62   SpO2: 97%   Weight: 118 lb (53.5 kg)   Height: 5' (1.524 m)        General: Awake and alert. Well developedand participated in the patient interview appropriately.  Eyes: Pupils equal, round and reactive to light. Extraocular movements intact.  Ears:visualized by otoscopy  Right:Pinna unremarkable. External ear canal without masses or skin abnormality. Free of cerumen. Tympanic membrane intact and without retraction or perforation. Middle ear without fluid.  Left: Pinna unremarkable. External ear canal without masses or skin abnormality. Free of cerumen. Tympanic membrane intact and without retraction or perforation. Middle ear  without fluid.    Nose: Nasal mucosa normal with clear mucus. Posterior DNS to right, with no polyps or lesions. Mild turbinate hypertrophy.  OP: Clear without masses or ulcerations.Normal lips and gums. Normal hard palate, soft palate, and posterior pharynx. No parotid or submandibular gland tenderness, masses, or swelling.         IMPRESSION/PLAN:Vasomotor rhinitis   Continue nasal irrigations BID, instructions provided  Continue Anti-histamine nasal spray (Astepro) for nasal drainage, nasal congestion  Consider Atrovent nasal spray       Kirke Shaggy, MD      Cc:   Rachel Moulds Mathews Robinsons, MD  Renelda Loma, MD  [address] [place] [address]

## 2017-01-05 NOTE — Patient Instructions (Addendum)
Nasal Irrigation  Nasal irrigations 2x/day  Neil med sinus rinse kit     Astepro (azelastine) 1-2 sprays in each nostril once daily (anti-histamine spray to reduce congestion, nasal drainage, allergies)        A. Prepare irrigating solution as follows:     Mix:1/2 tsp table salt and 1/2 tsp baking soda  (Store in dry clean container)     For Treatment: Put one teaspoon of above mix in two cups of warm water. (two cups= one pint)     B. Treatment Protocol:     Use soft rubber ear bulb syringe     Fill the bulb syringe with the solution.     Squeeze the bulb to force half the solution into one nostril and the rest into the other nostril using mild to moderate force.     Let the solution run out of your nose into the sink, then blow your nose.     Repeat but this time sniffle and snort some of the solution into the back of your throat and spit it out. Continue until all of the solution is used up.     C. Blow your nose again and use the nasal sprays prescribed by your doctor.     D.  Repeat irrigation procedure twice daily.     NOTE:  If the solution stings or bums your nose, only use 1/2 teaspoon of the mix with two cups water. Do not use cold or hot water because extreme temperatures will also make your nose feel like it is burning.        Post-Nasal Drip     · Patient Health Information        r  o           Glands in your nose and throat continually produce mucus (one to two quarts a day). Mucus moistens and cleans the nasal membranes, humidifies air, traps and clears inhaled foreign matter, and fights infection. Although it is normally swallowed unconsciously, the feeling of it accumulating in the throat or dripping from the back of your nose is called post-nasal drip. This sensation can be caused by excessively thick secretions or by throat muscle and swallowing disorders.  WHAT CAUSES ABNORMAL SECRETIONS?  Thin secretions:  Increased thin clear secretions can be due to colds and flu, allergies, cold  temperatures, bright lights, certain foods/spices, pregnancy, and other hormonal changes. Various drugs (including birth control pills and high blood pressure medications) and structural abnormalities can also produce increased secretions. These abnormalities might include a deviated or irregular nasal septum (the cartilage and bony dividing wall that separates the two nostrils).  Thick secretions:  Increased thick secretions in the winter often result from dryness in heated buildings and homes. They can also result from sinus or nose infections and allergies, especially to foods such as dairy products. If thin secretions become thick, and turn green or yellow, it is likely that a bacterial sinus infection is developing. In children, thick secretions from one side of the nose can mean that something is stuck in the nose such as a bean, wadded paper, or piece of toy. If these symptoms are observed, seek a physician for examination.  HOW IS SWALLOWING AFFECTED?  Swallowing problems may result in accumulation of solids or liquids in the throat that may complicate or feel like post-nasal drip. When the nerves and muscles in the mouth, throat, and food passage (esophagus) aren”t interacting properly, overflow secretions can spill into the voice box (larynx) and breathing passages (trachea and bronchi), causing hoarseness, throat clearing, or coughing.  Several factors contribute to swallowing problems:  ·   With age, swallowing muscles often lose strength and coordination, making it difficult for even normal secretions to pass smoothly into the stomach.  · During sleep, swallowing occurs much less frequently, and secretions may gather. Coughing and vigorous throat clearing are often needed upon waking.  · When nervous or under stress, throat muscles can trigger spasms that make it feel as if there is a lump in the throat. Frequent throat clearing, which usually produces little or no mucus, can make the problem worse by  increasing irritation.  · Growths or swelling in the food passage can slow or prevent the movement of liquids and/or solids.  Swallowing problems may also be caused by gastroesophageal reflux disease (GERD). This is a backup of stomach contents and acid into the esophagus or throat. Heartburn, indigestion, and sore throat are common symptoms. GERD may be aggravated by lying down, especially following eating. Hiatal hernia, a pouch-like tissue mass where the esophagus meets the stomach, often contributes to the reflux.  HOW IS THE THROAT AFFECTED?  Post-nasal drip often leads to a sore, irritated throat. Although there is usually no infection, the tonsils and other tissues in the throat may swell. This can cause discomfort or a feeling that there is a lump in the throat. Successful treatment of the post-nasal drip will usually clear up these throat symptoms.  HOW IS IT TREATED?  A correct diagnosis requires a detailed ear, nose, and throat exam, and possibly laboratory, endoscopic (procedures that use a tube to look inside the body), and x-ray studies. Treatment varies according to the following causes:  · Bacterial infections are treated with antibiotics. These drugs may only provide temporary relief. In cases of chronic sinusitis, surgery to open the blocked sinuses may be required.  · Allergies are managed by avoiding the causes. Antihistamines and decongestants, cromolyn and steroid (cortisone type) nasal sprays, and other forms of steroids may offer relief. Immunotherapy, either by shots or sublingual (under the tongue drops) may also be helpful. However, some older, sedating antihistamines may dry and thicken post-nasal secretions even more; newer nonsedating antihistamines, available by prescription only, do not have this effect. Decongestants can aggravate high blood pressure, heart, and thyroid disease. Steroid sprays may be used safely under medical supervision. Oral and injectable steroids rarely produce  serious complications in short-term use. Because significant side-effects can occur, steroids must be monitored carefully when used for more than one week.  · Gastroesophageal reflux is treated by elevating the head of the bed six to eight inches, avoiding foods and beverages for two to three hours before bedtime, and eliminating alcohol and caffeine from the daily diet. Antacids such as Maalox®, Mylanta®, Gaviscon®, and drugs that block stomach acid production such as Zantac®, Tagamet®, or Pepcid®) may be prescribed. If these are not successful, stronger medications can be prescribed. Trial treatments are usually suggested before x-rays and other diagnostic studies are performed.  General measures that allow mucus secretions to pass more easily may be recommended when it is not possible to determine the cause. Many people, especially older persons, need more fluids to thin out secretions. Drinking more water, eliminating caffeine, and avoiding diuretics (medications that increase urination) will help. Mucous-thinning agents such as guaifenesin (Humibid®, Robitussin®) may also thin secretions. Nasal irrigations may alleviate thickened secretions. These can be performed two to four times a day either with a nasal douche device or a Water Pik® with a nasal irrigation nozzle. Warm water with baking soda or salt (½ to 1 tsp. to the pint)   or Alkalol®, a nonprescription irrigating solution (full strength or diluted by half warm water), may be helpful. Finally, use of simple saline (salt) nonprescription nasal sprays (e.g., Ocean®, Ayr®, or Nasal®) to moisten the nose is often very beneficial.  SINUS CONDITIONS  Sinuses are air-filled cavities in the skull. They drain into the nose through small openings. Blockages in the openings from swelling due to colds, flu, or allergies may lead to acute sinus infection. A viral cold that persists for 10 days or more may have become a bacterial sinus infection. This infection may  increase post-nasal drip. If you suspect that you have a sinus infection, you should see your physician to see if it needs antibiotic treatment.  Chronic sinusitis occurs when sinus blockages persist, causing the lining of the sinuses to swell further. Polyps (growths in the nose) may develop with chronic sinusitis. Patients with polyps tend to have irritating, persistent post-nasal drip. Evaluation by an otolaryngologist may include an exam of the interior of the nose with a fiberoptic scope and CAT scan x-rays. If medication does not relieve the problem, surgery may be recommended.  Vasomotor Rhinitis describes a nonallergic ‘hyperirritable nose’ that feels congested, blocked, or wet.

## 2017-01-20 ENCOUNTER — Telehealth (INDEPENDENT_AMBULATORY_CARE_PROVIDER_SITE_OTHER): Payer: Self-pay | Admitting: Orthopaedic Surgery

## 2017-01-20 NOTE — Telephone Encounter (Signed)
Called and left a voice mail and advised that he is recommending gabapentin and we can call that into her pharmacy. Advised patient to call back before I do so we can discuss in detail.

## 2017-01-20 NOTE — Telephone Encounter (Signed)
Medication Management/Questions:    Medication in question: Pain medication   Concerns/questions: Pt was seen on 9/27 and Dr. Guido Sander mentioned that he was prescribing a medication.  Additional information: Pt states that her pharmacy never received the prescription. She also mentioned that she is in pain and she would like to receive the prescription asap. Please advise.  Pharmacy verified: Yes  Return Call: Detailed message on voicemail

## 2017-01-20 NOTE — Telephone Encounter (Signed)
Patient had been working on a ball in [place] and made her dizzy and nauseated for a few days. Advised to f/u with her PCP about this since it's not going away. Also advised to check her BP and blood sugar if possible.     She would like to not initiate any new medication at this time since she is feeling dizzy.     Advised patient to call back with any questions.

## 2017-01-24 ENCOUNTER — Encounter (INDEPENDENT_AMBULATORY_CARE_PROVIDER_SITE_OTHER): Payer: Self-pay | Admitting: Internal Medicine

## 2017-01-24 NOTE — Telephone Encounter (Signed)
Pt scheduled with PCP for 02/01/17 at 9:30 am.

## 2017-01-31 NOTE — Progress Notes (Signed)
Robin Hart is a 76 year old female who presents today for dizzy and nausea.       Assessment and Plan:    1. Vertigo, 2. Elevated blood pressure reading    She appears quite limited today by her symptoms.  They have been going on now for 3 weeks without full remission although she says she has days that are better than current.  Given that head position seems to be the main provoking factor and she lacks any focal deficits I do not have an emergent concern for central cause however her profound hypertension today and the severity of her symptoms and the persistence of her symptoms and her age are concerning enough that I think imaging is warranted.  Duration and severity of symptoms and the fact that it happens with moving head in any direction, makes BPPV seem highly unlikely.  Thus provoking maneuvers were not performed today.  She has no signs or symptoms of otitis media or viral infection and had a normal exam week prior to symptoms with her ENT.  This lack of apparent peripheral etiology also pushes me to want imaging to exclude a central process.  I spoke medical assistant and Dr. Aron Baba office after my visit with [name].  I advised them that I had encouraged her to seek an appointment as soon as possible with Dr. Augustin Coupe to see if steroids of some kind or another intervention might be useful.  In the meantime she will try meclizine to help with her symptoms.  If things get any worse she is advised to present to ER for immediate imaging.  Otherwise we will pursue outpatient imaging.    - MRI BRAIN STROKE  - MRA NECK W CONTRAST  - MRA HEAD W CONTRAST  - Meclizine HCl 12.5 MG Oral Tab; Take 1 tablet (12.5 mg) by mouth 3 times a day as needed for dizziness or nausea/vomiting.  Dispense: 20 tablet; Refill: 0    ---    Follow up: TBD        Subjective:     History of Present Illness:       Likes Dr Guido Sander, started PT.   Was at PT on 10/11 when she was sitting on a yoga ball and trying to balance, and 'blamo'  got really dizzy.   Lasted really terribly for 2 days. Pretty incapacitated. Since then will wax and wane. Almost goes away but then she moves her head and it comes right back.   Cant look over either shoulder anyway, right or left, without a lot of swirling and spinning sensation.   Once it starts, it will last hours. She is extremely nausea.     A little headache, but not much.    Laying down and closing eyes helps some, if she can fall asleep, sleeping will make it go away. Sometimes eating helps a little.     Ears are itchy, but no congestion, pressure, No hearing changes. Does have sensation of water or movement in the ear, but preceeds this symptom by months. Has been seeing Dr Augustin Coupe for this without etiology found.     Vision is fine    PT said no nystamgus.  Her whole life she has been prone to motion sickness and has been evaluated multiple times for BPPV and this has never been diagnosed.    Remote h/o migraines, rare. Has had a few episodes of 'crystalline' vision, not recently, as though a migraine was coming on but doesn't get  the HA.  Has not had episodes recently while she's been vertiginous.    She can walk.  It can bring on the spinning sensation but she doesn't fall.  With effort she is usually able to drive.    She checks her blood pressure at home and it has been running higher up to 150 but then will also drop down to the 130s.  It is never been as high as it is today.  She is currently very vertiginous.     Review of Systems   Eyes: Negative.    Musculoskeletal: Negative for neck pain.       Objective:     PE:    Vitals:    02/01/17 0924 02/01/17 0926   BP: 175/80 170/67   BP Cuff Size: Regular Regular   BP Site: Left Arm Left Arm   BP Position: Sitting Sitting   Pulse: 57    Resp: 16    Temp: 97.6 F (36.4 C)    TempSrc: Temporal    SpO2: 99%    Weight: 121 lb (54.9 kg)      There is no height or weight on file to calculate BMI.    Physical Exam   Constitutional: She is oriented to person,  place, and time. She appears well-developed and well-nourished.   Appears very uncomfortable   HENT:   Right Ear: Hearing, tympanic membrane, external ear and ear canal normal.   Left Ear: Hearing, tympanic membrane, external ear and ear canal normal.   Eyes: Conjunctivae and EOM are normal.   No nystagmus noted   Neck:   No spinus process tenderness  Normal fwd flexion, but lifting head back up makes her vertiginous    Cardiovascular: Normal rate, regular rhythm and normal heart sounds.    Pulmonary/Chest: Effort normal and breath sounds normal.   Neurological: She is alert and oriented to person, place, and time. She displays no tremor. She exhibits normal muscle tone. She displays a negative Romberg sign. Coordination normal.   Finger-nose-finger and RAM normal  Gait steady  No gross peripheral field cuts     Psychiatric: She has a normal mood and affect. Her speech is normal and behavior is normal.

## 2017-02-01 ENCOUNTER — Encounter (INDEPENDENT_AMBULATORY_CARE_PROVIDER_SITE_OTHER): Payer: Self-pay | Admitting: Internal Medicine

## 2017-02-01 ENCOUNTER — Ambulatory Visit (INDEPENDENT_AMBULATORY_CARE_PROVIDER_SITE_OTHER): Payer: Medicare PPO | Admitting: Internal Medicine

## 2017-02-01 VITALS — BP 170/67 | HR 57 | Temp 97.6°F | Resp 16 | Wt 121.0 lb

## 2017-02-01 DIAGNOSIS — R42 Dizziness and giddiness: Secondary | ICD-10-CM

## 2017-02-01 DIAGNOSIS — R030 Elevated blood-pressure reading, without diagnosis of hypertension: Secondary | ICD-10-CM

## 2017-02-01 DIAGNOSIS — Z6823 Body mass index (BMI) 23.0-23.9, adult: Secondary | ICD-10-CM

## 2017-02-01 MED ORDER — MECLIZINE HCL 12.5 MG OR TABS
12.5000 mg | ORAL_TABLET | Freq: Three times a day (TID) | ORAL | 0 refills | Status: DC | PRN
Start: 2017-02-01 — End: 2017-02-17

## 2017-02-01 NOTE — Patient Instructions (Addendum)
DR LIN SEE    MRI    ER IF WORSE      It was a pleasure to see you in clinic today. Your Medical Assistant was: Psychiatrist, [place].          You can schedule an appointment to see [place] by calling  [phone] or via eCare.     If labs were ordered today the results are expected to be available via eCare 5 days later. Otherwise, result letters are mailed 7-10 days after your tests are completed. If your physician needs to change your care based on your results, you will receive a phone call to notify you. If you haven't heard from him/her and it has been more than 10 days please give [place] a call.     You may receive a survey in the mail asking how your experience has been with [facility].  Your input and opinions are very important to [place].  I hope you are able to take the time to complete this survey.    Thank you for choosing [facility].

## 2017-02-01 NOTE — Progress Notes (Signed)
Visit:   Chief Complaint   Patient presents with    Dizziness     Patient complains of dizziness and nausea since October 11th.  States feeling is intermittant, but when she turns her head she feels worse.     Refills? NO  Referral? NO  Letter or Form? NO  Lab Results? NO    HEALTH MAINTENANCE:  Has the patient has this done since their last visit?  Cervical screening/PAP: N/A  Mammo: N/A  Colon Screen: N/A  Diabetic Eye Exam (If applicable): N/A    Have you seen a specialist since your last visit: No    Vaccines Due? Yes, zoster, flu    Does patient have eCare?  yes    HM Due:   Health Maintenance   Topic Date Due    Zoster Vaccine (2 of 3) 11/18/2011    Influenza Vaccine (1) 01/02/2017    Depression Screening (PHQ-2)  10/28/2017    Lipid Disorders Screening  08/05/2020    Tetanus Vaccine  04/16/2022    Colorectal Cancer Screening (FOBT/FIT)  06/02/2025    Osteoporosis Screening  Completed    Pneumococcal Vaccine  Completed       PCP Verified?  Yes, Volkman, Mathews Robinsons, MD

## 2017-02-02 ENCOUNTER — Other Ambulatory Visit: Payer: Self-pay

## 2017-02-06 ENCOUNTER — Ambulatory Visit (INDEPENDENT_AMBULATORY_CARE_PROVIDER_SITE_OTHER): Payer: Medicare PPO

## 2017-02-06 ENCOUNTER — Encounter (INDEPENDENT_AMBULATORY_CARE_PROVIDER_SITE_OTHER): Payer: Self-pay | Admitting: Internal Medicine

## 2017-02-06 DIAGNOSIS — R42 Dizziness and giddiness: Secondary | ICD-10-CM

## 2017-02-06 DIAGNOSIS — Z23 Encounter for immunization: Secondary | ICD-10-CM

## 2017-02-06 NOTE — Telephone Encounter (Signed)
Please see ecare message from pt. Update on dizzy spells

## 2017-02-06 NOTE — Progress Notes (Signed)
Vaccine Screening Questions    Interpreter: No    1. Are you allergic to Latex? NO    2.  Have you had a serious reaction or an allergic reaction to a vaccine?  NO    3.  Currently have a moderate or severe illness, including fever?  NO    4.  Ever had a seizure or any neurological problem associated with a vaccine? (DTaP/TDaP/DTP pertinent) NO    5.  Is patient receiving any live vaccinations today? (Varicella-Chickenpox, MMR-Measles/Mumps/Rubella, Zoster-Shingles, Flumist, Yellow Fever) NOTE: oral rotavirus is exempt  NO    If YES to any of the questions above - Do NOT give vaccine.  Consult with RN or provider in clinic.  (#5 can be YES if all Live vaccine questions are answered NO)    If NO to all questions above - Patient may receive vaccine.    6.  Do you need to receive the Flu vaccine today? YES - Additional Flu Questions  Flu Vaccine Screening Questions:    Ever had a serious allergic reaction to eggs?  NO    Ever had Guillain-Barre syndrome associated with a vaccine? NO    Less than 6 months old? NO    If YES to any of the Flu questions above - NO Flu Vaccine to be given.  Patient may consult provider as needed.    If NO to all questions above - Patient may receive Flu Shot (IM)    If between 6 months and 8 years of age, was flu vaccine received last year?  N/A  If NO to above question:  • Children who are receiving influenza vaccine for the first time - administer 2 doses of the current influenza vaccine (separated by at least 4 weeks).      Vaccine given today without initial adverse effect. YES     All patients are encouraged to wait 15 minutes before leaving after receiving any vaccine.    VIS given 02/06/2017 by Teniqua Marron M Amethyst Gainer.

## 2017-02-08 ENCOUNTER — Ambulatory Visit (INDEPENDENT_AMBULATORY_CARE_PROVIDER_SITE_OTHER): Payer: Medicare PPO | Admitting: Otolaryngology

## 2017-02-08 ENCOUNTER — Encounter (INDEPENDENT_AMBULATORY_CARE_PROVIDER_SITE_OTHER): Payer: Self-pay | Admitting: Otolaryngology

## 2017-02-08 VITALS — BP 168/70 | HR 64 | Ht 60.0 in | Wt 121.0 lb

## 2017-02-08 DIAGNOSIS — H8110 Benign paroxysmal vertigo, unspecified ear: Secondary | ICD-10-CM

## 2017-02-08 DIAGNOSIS — Z6823 Body mass index (BMI) 23.0-23.9, adult: Secondary | ICD-10-CM

## 2017-02-08 DIAGNOSIS — R42 Dizziness and giddiness: Secondary | ICD-10-CM

## 2017-02-08 NOTE — Progress Notes (Signed)
02/08/2017        OFFICE VISIT      PATIENT:  Robin Hart  DOB:  April 19, 1940  MRN:  [id number]      CHIEF COMPLAINT/HPI:  Patient is a 76 year old female who presented with dizziness for 1 month.   Symptoms are   moderate, intermittent, and unchanged.  It is described as a sensation of motion sickness   and tilting.  Symptoms occurred after using a balance ball during physical therapy for her   back.  Symptoms are aggravated by rapid movement and turning her head to the right.    She believes that hypertension makes symptoms worse.  Symptoms can last hours.    Relieving factors include sleeping and eating.  She has not been using Meclizine.  Associated   symptoms include nausea and ear pressure.  She has migraines in the past but not currently.    She denies otalgia, otorrhea, tinnitus, hearing loss, diplopia, vision loss, fever,  loss of   consciousness, vomiting, neck stiffness, palpitations, paresthesia, seizures, slurred speech,   and weakness.  No recent trauma or URI.  MRI and MRA brain scheduled for next week per   PCP.       Prior testing: none    Audiogram:     CT temporal bone:     MRI brain/internal auditory canals:        HEARING AID USE:  None      PMHX:  Past Medical History:   Diagnosis Date    Allergic rhinitis due to allergen     Arthritis     Cancer (HCC)     skin cell carcinoma     Constipation     Diverticulosis     Glaucoma     H. pylori infection     Headache     History of diarrhea     History of irritable bowel syndrome     Hypertension     Lipidemia     Osteoporosis     Primary insomnia      PSHX:  Past Surgical History:   Procedure Laterality Date    Basal cell carcinoma excision      BLADDER SUSPENSION      BREAST BIOPSY      COLONOSCOPY STOMA DX INCLUDING COLLJ SPEC SPX      TOTAL ABDOMINAL HYSTERECT W/WO RMVL TUBE OVARY       FHX: @HXFAMILY @  SHX:  Social History     Social History    Marital status: N/A     Spouse name: N/A    Number of children: N/A    Years  of education: N/A     Occupational History    Not on file.     Social History Main Topics    Smoking status: Never Smoker    Smokeless tobacco: Never Used    Alcohol use Yes    Drug use: No    Sexual activity: Not Currently     Other Topics Concern    Not on file     Social History Narrative    No narrative on file       ALLERGIES:  has No Known Allergies.    MEDICATIONS:  has a current medication list which includes the following prescription(s): azelastine hcl, latanoprostene bunod, meclizine hcl, timolol maleate, and zoster vac recomb adjuvanted.      REVIEW OF SYSTEMS:    Constitutional: negative  Eye: negative  ENT:  + dizziness, vertigo, ear  pressure.  Denies excessive cerumen, hearing loss, infections, otalgia, tinnitus, noise exposure, Nose/Sinus: anosmia/hyposmia, nasal drainage, epistaxis, facial pain, nasal congestion, nasal obstruction, rhinorrhea, sinusitis, sneezing, Throat: taste change, voice change, hoarseness, dysphagia, lump in throat, odynophagia, post-nasal drainage, sore tongue, pharyngitis, snoring, tooth pain   Cardiovascular: negative   Respiratory:  negative  Gastrointestinal:  + nausea  Endocrine:  negative    Neurological: + dizziness, vertigo   Psychiatric:  negative   Genitourinary:  negative  Integumentary: negative  Musculoskeletal: negative   Hematologic: negative  Immunologic: negative      PHYSICAL EXAMINATION:  Vitals:    02/08/17 1425   BP: 168/70   BP Cuff Size: Regular   BP Site: Right Arm   BP Position: Sitting   Pulse: 64   Weight: 121 lb (54.9 kg)   Height: 5' (1.524 m)        General: Well developed, appearing stated age and in no acute distress. Participated in the patient interview appropriately.  Eyes: Clear conjunctive, normal lids. Pupils equal, round and reactive to light.  Extraocular movements intact.  Ears: visualized by microscopy.    Right: Pinna unremarkable.  External ear canal without masses or skin abnormality. Free of cerumen. Tympanic membrane  intact and without retraction or perforation. Middle ear without fluid.  No hearing loss.     Left: Pinna unremarkable.  External ear canal without masses or skin abnormality. Free of cerumen. Tympanic membrane intact and without retraction or perforation. Middle ear without fluid. No hearing loss.      Neuro:  CN II-XII grossly intact (VII Movements of facial expression bilaterally intact and symmetrical), Normal finger to nose, Romberg, Fukuda stepping testing.  No pronator drift.  Negative Dix-Hallpike.        IMPRESSION/PLAN: Dizziness, possibly atypical benign paroxysmal positional vertigo or vestibular hypofunction  Order VNG, audiogram, ECOG/ABR - NWO, check inner ear function  Order Vestibular therapy - N[facility]        Kirke Shaggy, MD      Cc:   Rachel Moulds Mathews Robinsons, MD  Renelda Loma, MD  [address] [place] [address]

## 2017-02-08 NOTE — Patient Instructions (Signed)
Patient Education     Anatomy of the Ear    The ear is a complex and delicate organ. It collects sound waves so you can hear the world around you. The ear also has a second function--it helps you keep your balance. Your ear can be divided into 3 parts. The outer ear and middle ear help collect and amplify sound. The inner ear converts sound waves to messages that are sent to the brain. The inner ear also senses the movement and position of your head and body so you can maintain your balance and see clearly, even when you change positions.  The mastoid bone surrounds the middle ear. The external ear collects sound waves. The ear canal carries sound waves to the eardrum. The eardrum vibrates from sound waves, setting the middle ear bones in motion. The middle ear bones (ossicles) vibrate, transmitting sound waves to the inner ear. When the ear is healthy, air pressure remains balanced in the middle ear. The eustachian tube helps control air pressure in the middle ear.The semicircular canals help maintain balance. The vestibular nerve carries balance signals to the brain. The auditory nerve carries sound signals to the brain. The cochlea picks up sound waves and makes nerve signals.    Date Last Reviewed: 01/03/2015   2000-2017 [employer]. [address], [place] [address]. All rights reserved. This information is not intended as a substitute for professional medical care. Always follow your healthcare professional's instructions.    Patient Education     Possible Causes of Dizziness or Fainting    Dizziness and fainting can have many causes. Below are some examples ofpossible causes your healthcare provider will look to rule out.  Benign paroxysmal positional vertigo (BPPV)  BPPV results when calcium crystals inside the inner ear shift into the wrong position. BPPV causes episodes of vertigo (a spinning sensation). Episodes most often happen when the head is moved in a certain way. This is  more common in people 59 and older.  Infection or inflammation  The semicircular canals of the ear may become infected or inflamed. In this case, they can send the wrong balance signals. This can cause vertigo.  Meniere disease  Meniere disease happens when there is too much fluid in the semicircular canals. This can cause vertigo. It also can cause hearing problems and buzzing or ringing in the ears (called tinnitus). You may also have a feeling of pressure or fullness in the ear.  Syncope  Syncope is fainting that happens when the brain doesnt get enough oxygen-rich blood. It can be caused by low heart rate or low blood pressure. This is called vasovagal syncope. It can also be caused by sitting or standing up too quickly. This is called orthostatic hypotension. Syncope may also be due to a heart valve problem, an abnormal heart rhythm, or other heart problems. Dizziness can also happen from stroke, hemorrhage in the brain, or other problems in the brain. Your healthcare provider may do certain tests to rule out these conditions.  Other causes  Other causes include:   Medicines. Certain medicines can cause dizziness and even fainting. In some cases, stopping a medicine too quickly can lead to withdrawal symptoms, including dizziness and fainting.   Anxiety. Being anxious can lead to breathing changes, such as hyperventilation. These can lead to dizziness and fainting.  Additional causes for dizziness and fainting also exist.Talk to your healthcare provider for more information.   Date Last Reviewed: 01/07/2014   2000-2017  [employer]. [address], [place] [address]. All rights reserved. This information is not intended as a substitute for professional medical care. Always follow your healthcare professional's instructions.    Patient Education     Dizziness (Vertigo) and Balance Problems: Diagnostic Tests    An otolaryngologist (also called an ENT) is a doctor who specializes in  disorders of the ear, nose, and throat. Your ENT can help find clues to the cause of your dizziness. He or she will examine you and go over your health history. Your ENT may also order certain tests to help diagnose your problem.  Hearing testing  In most cases, you will be referred for hearing testing. This is because the nerve that sends balance signals also sends hearing signals. A problem that affects balance can also affect hearing.  Other tests  Your doctor may recommend more than one kind of test. The following tests are painless, but may cause dizziness in some cases.   MRI creates images of the ear or head. A magnetic field and contrast medium are used to make the image.   Electronystagmography (ENG) records eye movement. Small electrodes are put on the skin around your eyes. Then your ear is filled with warm or cold water.   Rotation tests show the relationship between the inner ear and your eyes. You may be asked to wear special goggles or sit in a computerized chair.   Posturography tests your standing balance under different conditions. You will stand on a platform that measures shifts in your body weight.   Electrocochleography (ECoG) measures the fluid pressure in the inner ear. An abnormal ECoG may mean you have Meniere's disease or other conditions.   Vestibular evoked myogenic potentials (VEMPs) may be used if your healthcare provider suspects a rare condition like superior semicircular canal dehiscence. Electrodes are placed on your neck, and you hear clicks in your ear.  Date Last Reviewed: 02/03/2015   2000-2017 [employer]. [address] [place] [address]. All rights reserved. This information is not intended as a substitute for professional medical care. Always follow your healthcare professional's instructions.

## 2017-02-09 MED ORDER — DIAZEPAM 2 MG OR TABS
2.0000 mg | ORAL_TABLET | Freq: Two times a day (BID) | ORAL | 0 refills | Status: DC | PRN
Start: 2017-02-09 — End: 2017-02-17

## 2017-02-09 NOTE — Telephone Encounter (Signed)
Faxed in to [facility] provider sent Ecare to pt

## 2017-02-09 NOTE — Telephone Encounter (Signed)
Staff: diazepam rx printed and signed. In my outbox. Please phone for pt and then fax.

## 2017-02-10 ENCOUNTER — Telehealth (INDEPENDENT_AMBULATORY_CARE_PROVIDER_SITE_OTHER): Payer: Self-pay | Admitting: Orthopaedic Surgery

## 2017-02-10 NOTE — Telephone Encounter (Signed)
General Message:    Detailed Message: Patient had to cancel her appointment with Dr. Guido Sander on Monday. She requested a call back to discuss her current health situation.  Return Call: Detailed message on voicemail

## 2017-02-13 ENCOUNTER — Encounter (INDEPENDENT_AMBULATORY_CARE_PROVIDER_SITE_OTHER): Payer: Medicare PPO | Admitting: Orthopaedic Surgery

## 2017-02-13 NOTE — Telephone Encounter (Signed)
Called and spoke to the patient. Her PHYSICAL THERAPY is on hold for now until she gets her vertigo figured out. She is seeing ENT specialist tomorrow and hope to resolve this soon. She will call to reschedule her f/u. Advised if she needs to get in sooner to call me.

## 2017-02-14 ENCOUNTER — Telehealth (INDEPENDENT_AMBULATORY_CARE_PROVIDER_SITE_OTHER): Payer: Self-pay | Admitting: Otolaryngology

## 2017-02-14 ENCOUNTER — Ambulatory Visit (INDEPENDENT_AMBULATORY_CARE_PROVIDER_SITE_OTHER): Payer: Medicare PPO | Admitting: Audiologist

## 2017-02-14 DIAGNOSIS — R42 Dizziness and giddiness: Secondary | ICD-10-CM

## 2017-02-14 NOTE — Progress Notes (Signed)
Indications for Testing:  To rule out Menieres disease and investigate for possible retrocochlear pathology.    ECoG testing was performed using tip-trode electrodes and a 90 dB nHL alternating polarity click stimulus of 16.1 per second for each ear. At least two waveforms consisting of 2000 stimulus samples each were obtained for each ear.     SP and AP components demonstrated good morphology and replicability in both ears. The following measurements were made:    Right Ear SP = 0.09V  AP = 0.39V  SP/AP Ratio = 0.23  Left Ear SP = 0.02V  AP = 0.20V  SP/AP Ratio = 0.10                      WNL = <.45     ABR waveforms were obtained using an alternating or rarefaction polarity click stimulus of 09.6  per second at 90 dB nHL.  Waveforms demonstrated excellent morphology and replicability in both ears.  The following measurements were made:    Wave      I  III           V          I-III  III-V  I-V    Norms:  1.39-  3.59-        5.38-      1.88- 1.59-  3.82-   90 dB:  2.00  4.16        6.22      2.88  2.26  4.46    Patient  Right Ear 1.13ms  3.47ms       5.23ms     2.21ms    1.86ms  4.100ms        Patient    Left Ear 1.54ms  3.59ms       5.42ms     2.45ms    2.39ms  4.23ms        Interaural Difference  WNL = < 0.26ms      Wave V - 0.53ms                    Waves I-V -0.40ms    Impressions: All ABR absolute, interwave, and interaural latencies were within normal limits.  ECoG SP/AP ratios were within normal limits in both ears, indicative of normal fluid levels in both ears.         ICD-10-CM ICD-9-CM    1. 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 Kirke Shaggy.    Kizzie Furnish, AUD

## 2017-02-14 NOTE — Progress Notes (Signed)
Robin Hart is a 23 yrs female who was seen for a hearing evaluation.    [place]    Otoscopic Inspection:     Right Ear: Clear EAC, visualized TM    Left Ear: Clear EAC, visualized TM    Tympanometry: See Scanned results in EPIC media tab    Right Ear: Normal peak pressure and compliance    Left Ear: Normal peak pressure and compliance    Audiometry: See Scanned results in EPIC media tab    Right Ear: Hearing is within normal limits 250-8000Hz     Left Ear: Hearing is within normal limits 250-8000Hz     Speech Discrimination (Word Recognition): See Scanned results in EPIC media tab    Right Ear:  100% words correct at 60 dBHL.    Left Ear:  100% words correct at  50 dBHL    PLAN      ICD-10-CM ICD-9-CM    1. Disequilibrium R42 780.4 COMPRE AUDIOMETRY THRESHOLD EVAL SP RECOGNIJ      TYMPANOMETRY AND REFLEX THRESHOLD MEASUREMENTS       Patient to follow up with Dr. Kirke Shaggy    Kizzie Furnish, AUD

## 2017-02-14 NOTE — Progress Notes (Signed)
Robin Hart is a 74 yrs female seen for a Videonystagmography evaluation.    Test Results       Gaze: within normal limits   Fixed Saccade: within normal limits   Random Saccade: within normal limits   Pursuit: within normal limits   Fixed OPK: within normal limits     VORTEQ:  see results scanned into EPIC media tab    Horizontal:  Gain and phase abnormalities    Vertical:  Gain abnormalities     Spontaneous nystagmus: within normal limits     Dix-Hallpike:    Head right supine:  Nystagmus:  No        Dizziness: No      Head right sitting: Nystagmus:  No        Dizziness: Moderate dizziness      Head left supine:  Nystagmus:  No        Dizziness: No      Head left sitting:   Nystagmus:  No        Dizziness: Slight dizziness             Positional:   Head right: Nystagmus:  No       Dizziness: No    Head center: Nystagmus:  No       Dizziness: "Tiny bit" of dizziness     Head left:  Nystagmus:  No       Dizziness: No     Calorics: Left cool:    14 deg/sec                  Left warm:  35  deg/sec    Right cool:  20 deg /sec    Right warm:  22 deg/sec       Fixation Suppression: within normal limits     Caloric Weakness:  8% right    Directional Preponderance: 21% left beating     Summary: VORTEQ revealed gain and phase abnormalities.  All other testing was within normal limits.      ICD-10-CM ICD-9-CM    1. Disequilibrium R42 780.4 CALORIC VESTIBULAR TEST W/REC BI BITHERMAL      VSTBLR FUNCJ NYSTAG FOVL&PERPH STIMJ OSCIL TRK       Recommendations:  Follow up with Dr. Kirke Shaggy.    Kizzie Furnish, AUD

## 2017-02-14 NOTE — Telephone Encounter (Signed)
Audiogram 02/14/17: WNL, tymp A AU, SDS 100% AU  VNG:  WNL  ECOG:  AD 0.23, AS 0.10  (WNL = <.45)  ABR: WNL      Normal inner ear function and vestibular function  Refer to Vestibular therapy - [place]  MRI/MRA brain scheduled

## 2017-02-17 ENCOUNTER — Ambulatory Visit (INDEPENDENT_AMBULATORY_CARE_PROVIDER_SITE_OTHER): Payer: Self-pay | Admitting: Internal Medicine

## 2017-02-17 ENCOUNTER — Encounter (INDEPENDENT_AMBULATORY_CARE_PROVIDER_SITE_OTHER): Payer: Self-pay | Admitting: Family Medicine

## 2017-02-17 ENCOUNTER — Ambulatory Visit (INDEPENDENT_AMBULATORY_CARE_PROVIDER_SITE_OTHER): Payer: Medicare PPO | Admitting: Family Medicine

## 2017-02-17 ENCOUNTER — Ambulatory Visit: Payer: Medicare PPO | Attending: Otolaryngology

## 2017-02-17 VITALS — BP 173/72 | HR 57 | Temp 97.5°F | Resp 20 | Wt 118.0 lb

## 2017-02-17 DIAGNOSIS — R42 Dizziness and giddiness: Secondary | ICD-10-CM

## 2017-02-17 DIAGNOSIS — N393 Stress incontinence (female) (male): Secondary | ICD-10-CM | POA: Insufficient documentation

## 2017-02-17 DIAGNOSIS — I10 Essential (primary) hypertension: Secondary | ICD-10-CM

## 2017-02-17 DIAGNOSIS — N3941 Urge incontinence: Secondary | ICD-10-CM | POA: Insufficient documentation

## 2017-02-17 DIAGNOSIS — Z6823 Body mass index (BMI) 23.0-23.9, adult: Secondary | ICD-10-CM

## 2017-02-17 DIAGNOSIS — R531 Weakness: Secondary | ICD-10-CM | POA: Insufficient documentation

## 2017-02-17 LAB — COMPREHENSIVE METABOLIC PANEL
ALT (GPT): 11 U/L (ref 7–33)
AST (GOT): 22 U/L (ref 9–38)
Albumin: 4.2 g/dL (ref 3.5–5.2)
Alkaline Phosphatase (Total): 87 U/L (ref 49–199)
Anion Gap: 8 (ref 4–12)
Bilirubin (Total): 0.4 mg/dL (ref 0.2–1.3)
Calcium: 9.3 mg/dL (ref 8.9–10.2)
Carbon Dioxide, Total: 29 meq/L (ref 22–32)
Chloride: 106 meq/L (ref 98–108)
Creatinine: 0.49 mg/dL (ref 0.38–1.02)
GFR, Calc, African American: >60 mL/min/{1.73_m2} (ref >59)
GFR, Calc, European American: >60 mL/min/{1.73_m2} (ref >59)
Glucose: 94 mg/dL (ref 62–125)
Potassium: 3.9 meq/L (ref 3.6–5.2)
Protein (Total): 6.4 g/dL (ref 6.0–8.2)
Sodium: 143 meq/L (ref 135–145)
Urea Nitrogen: 18 mg/dL (ref 8–21)

## 2017-02-17 MED ORDER — LISINOPRIL 5 MG OR TABS
5.0000 mg | ORAL_TABLET | Freq: Every day | ORAL | 1 refills | Status: DC
Start: 2017-02-17 — End: 2017-03-02

## 2017-02-17 NOTE — Telephone Encounter (Signed)
Robin Hart reports high blood pressures during OT today, her dizziness has improved significantly since ending her therapy session, but highest was 196/55. Dizziness accompanied by hypertension has been ongoing for the past month, and today's episode has not been different from past episodes. She agrees to OV today.    Reason for Disposition   BP > 180/110    Additional Information   Negative: Sounds like a life-threatening emergency to the triager   Negative: BP > 160 / 100 and any cardiac or neurologic symptoms (e.g., chest pain, difficulty breathing, unsteady gait, blurred vision)   Negative: Patient sounds very sick or weak to the triager    Protocols used: HIGH BLOOD PRESSURE-ADULT-OH    Robin Gore, RN 02/17/17 10:25 AM

## 2017-02-17 NOTE — Progress Notes (Signed)
VESTIBULAR INITIAL EVALUATION      GENERAL VISIT INFO    Encounter Diagnosis   Name Primary?    Dizziness and giddiness Yes     Referring Provider: Kirke Shaggy, MD      VISIT COUNT   1    G-CODE VISIT COUNT  1    EVALUATION    General Assessment  Certification From*: 61/95/09  Certification To*: 32/67/12  Date of Symptom Onset*: 01/06/17  Start of Care Date*: 02/17/17  Reason for Referral*: Evaluate and treat; R/O BPPV  Patient Outcome Tool: DHI (Dizziness Handicap Inventory)  Outcome Score: 22  Have you fallen in the past year?: No  Are you afraid of falling?: Yes  Issues with walking/balance/feeling unsteady: No  Based on screening, patient's fall risk is low.    Prior Level of Function  Level of Independence: Independent with ADLs and functional transfers  Lives With: Alone  Vocational: Retired    Past Medical History:   Diagnosis Date    Allergic rhinitis due to allergen     Arthritis     Cancer ([place])     skin cell carcinoma     Constipation     Diverticulosis     Glaucoma     H. pylori infection     Headache     History of diarrhea     History of irritable bowel syndrome     Hypertension     Lipidemia     Osteoporosis     Primary insomnia        Social History     Social History Narrative    No narrative on file           SUBJECTIVE:  76 yo female who was referred to vestibular therapy for evaluation to R/O BPPV.  Reports a 6 wk hx of dizziness that started when she while she was sitting on a physio ball in PT.  Reports symptoms of whirling in her head that increases with quick head turns that can last minutes to hours.   Has noticed elevated BP when symptomatic.  Denies symptoms of vertigo. VNG findings were normal.  MRI pending. Reports a long standing hx of motion intolerance.      OBJECTIVE:    CERVICAL SCREEN:   Cervical AROM: [facility]  MUSCULOSKELETAL:  LE AROM: WFL  LE Strength WFL    SENSORY:                LE sensation to light touch:WF:   LE Proprioception:WF:       OCCULOMOTOR EXAM:               Gaze Evoked Nystagmus: Negative  Smooth Pursuit: WFL  Saccades: WFL  Convergence: WFL      VESTIBULAR OCULAR EXAM:        Slow phase: WFL                  Fast phase: Symptomatic                 Right Head Impulse Test: negative          Left Head Impulse Test: positive    POSTURAL'S:   Supine 155/60   Sitting  *196/55   Standing 156/59            BENIGN PAROXYSMAL POSITIONAL VERTIGO (BPPV): MODIFIED TEST  R [place]: Negative       L [place]: Negative                     Right Roll test: Negative     Left Roll test: Negative       ASSESSMENT:    Robin Hart is a 76 year old female who presents with symptoms of dizziness that she describes as whirling "in her head" that increases with quick and sudden movements.  DHI score was 22/100 which  indicates low self perception of handicap due to dizziness and/or unsteadiness.  Occulotmotor tests were [facility].  Vestibular occular motor tests were [facility] with the exception of the HIT tests. Orthostatic changes noted with changes in position. VNG findings were normal.  MRI pending.  Reports a long standing hx of motion intolerance.        TREATMENT PLAN:  Further assessment if needed pending further follow up.    SHORT TERM GOALS (2-4 weeks) and LONG TERM GOALS (5-8 weeks):    To be established if pt pursues further testing.        G-Codes for Functional Reporting                      Severity Modifier selections based on the Objective Findings and co-morbility assessment.         TREATMENT:  Today's appointment consisted of the initial evaluation. The patient was educated regarding vestibular system, anatomy, plan of care, and  goals and advised to follow up pending further work up results.      PLAN FOR NEXT SESSION:  Proceed  with additional testing pending MRI and further work up.           This note serves as a Neurosurgeon form that requires the referring provider to certify the need for therapy services furnished under this Plan of Treatment.  The signing provider is certifying the plan of care.         Charlies Constable, OT

## 2017-02-17 NOTE — Progress Notes (Signed)
Robin Hart is a 76 year old female here to discuss the following:  Chief Complaint   Patient presents with    Blood Pressure       The following portions of the patient's history were reviewed with the patient and updated as appropriate: problem list, current medications, allergies, past medical history.    SUBJECTIVE:  Here for dizziness and HTN. Was at the OT's office today and was found to have BP 155-196/55-60.   Has been undergoing work-up with ENT recently to see if dizziness has something to do with her ears.  Has MRI schedule for after Thanksgiving. Has been treating her HTN with exercise and diet.  Says home BPs are normal some times.   Has tried diuretics in the past but has OAB and diuretic made her symptoms worse.  She is open to starting medication.    OBJECTIVE:  VS: BP 173/72    Pulse 57    Temp 97.5 F (36.4 C) (Temporal)    Resp 20    Wt 118 lb (53.5 kg)    BMI 23.05 kg/m   GEN: NAD  ENMT: nl external appearance of ears and nose, TMs and OP clear with MMM  CV: RRR, nl S1 and S2, no m/r/g  RESP: normal respiratory effort, CTAB.  no rales or wheeze    ASSESSMENT/PLAN:    (I10) Essential hypertension  (primary encounter diagnosis)  Plan: COMPREHENSIVE METABOLIC PANEL, Lisinopril 5 MG         Oral Tab  Uncontrolled.  Counseled on options.  Patient agrees to try lisinopril 5 mg daily.  Check labs to establish baseline.  Counseled on lifestyle and dietary modification, low salt, regular exercise, weight management.  Recheck in 1 month.  Instructed patient to bring home blood pressure log to her next visit with PCP.      (R42) Dizziness  Plan: Unclear etiology.  Patient has not tried meclizine.  Advised her to try meclizine as needed for symptoms.  Follow-up with ENT and PCP.    The patient indicates understanding of these issues and agrees with the plan. Potential side effects, cautions, risks, and rationale were discussed for medications and/or treatment options recommended.     Call or  return to clinic as needed if these symptoms worsen or fail to improve as anticipated.     Arturo Morton, MD, MPH  [facility]

## 2017-02-17 NOTE — Addendum Note (Signed)
Addended by: Willaim Sheng ANN on: 02/17/2017 11:38 AM     Modules accepted: Orders

## 2017-02-17 NOTE — Telephone Encounter (Signed)
Pt states that she was just at OT  And her BP was high and she feels dizzy       Lying - 155/60     Sitting - 196/55     Standing - 156/59

## 2017-02-20 ENCOUNTER — Encounter (INDEPENDENT_AMBULATORY_CARE_PROVIDER_SITE_OTHER): Payer: Self-pay | Admitting: Family Medicine

## 2017-02-27 ENCOUNTER — Other Ambulatory Visit (HOSPITAL_BASED_OUTPATIENT_CLINIC_OR_DEPARTMENT_OTHER): Payer: Medicare PPO

## 2017-02-27 ENCOUNTER — Ambulatory Visit
Admit: 2017-02-27 | Discharge: 2017-02-27 | Disposition: A | Discharge status: Home / Self Care | Payer: Medicare PPO | Attending: Internal Medicine | Admitting: Internal Medicine

## 2017-02-27 DIAGNOSIS — G3189 Other specified degenerative diseases of nervous system: Secondary | ICD-10-CM | POA: Insufficient documentation

## 2017-02-27 DIAGNOSIS — R42 Dizziness and giddiness: Secondary | ICD-10-CM

## 2017-02-27 DIAGNOSIS — R9082 White matter disease, unspecified: Secondary | ICD-10-CM | POA: Insufficient documentation

## 2017-02-28 ENCOUNTER — Telehealth (INDEPENDENT_AMBULATORY_CARE_PROVIDER_SITE_OTHER): Payer: Self-pay | Admitting: Family Medicine

## 2017-02-28 NOTE — Telephone Encounter (Signed)
Called and left nonspecific message.  Defer follow-up to PCP tomorrow    Record review-recently saw Dr. Crisoforo Oxford and lisinopril started for hypertension  Has pending appointment with PCP on 11/29      ===========================================================================    MRI stroke protocol    Impression     IMPRESSION:  1. Foci of apparent microhemorrhage in the pons and thalami bilaterally with   pattern most consistent with hypertensive disease.  2. Age-related cerebral volume loss and white matter FLAIR hyperintensities   without evidence of infarcts or mass lesions.  3. Although not the focus of this noncontrast brain MRI for assessing for   possible stroke, no gross evidence of internal auditory canal or inner ear   lesions. If there is high concern of inflammatory or neoplastic disease along   the vestibular nerve, a pre and postcontrast brain MRI focusing on the   internal auditory canals may be helpful.         Specimen Collected: 02/27/17 16:03 Last Resulted: 02/27/17 16:26

## 2017-03-01 NOTE — Telephone Encounter (Signed)
Patient called returning Dr. Mindi Curling call.  I spoke with Kristine C. Dr. Audree Bane MA, and informed me that since patient is already scheduled with Dr. Rachel Moulds on 03-02-17, it should be ok.  Regardless, Minette Headland will talk to Dr. Rachel Moulds to make sure it is nothing urgent.  If it is, will call patient and schedule her to come in today.

## 2017-03-02 ENCOUNTER — Ambulatory Visit (INDEPENDENT_AMBULATORY_CARE_PROVIDER_SITE_OTHER): Payer: Medicare PPO | Admitting: Internal Medicine

## 2017-03-02 ENCOUNTER — Encounter (INDEPENDENT_AMBULATORY_CARE_PROVIDER_SITE_OTHER): Payer: Self-pay | Admitting: Internal Medicine

## 2017-03-02 VITALS — BP 176/69 | HR 56 | Temp 97.6°F | Resp 12 | Wt 121.0 lb

## 2017-03-02 DIAGNOSIS — G468 Other vascular syndromes of brain in cerebrovascular diseases: Secondary | ICD-10-CM

## 2017-03-02 DIAGNOSIS — R42 Dizziness and giddiness: Secondary | ICD-10-CM

## 2017-03-02 DIAGNOSIS — I679 Cerebrovascular disease, unspecified: Secondary | ICD-10-CM

## 2017-03-02 DIAGNOSIS — I10 Essential (primary) hypertension: Secondary | ICD-10-CM

## 2017-03-02 DIAGNOSIS — Z6823 Body mass index (BMI) 23.0-23.9, adult: Secondary | ICD-10-CM

## 2017-03-02 DIAGNOSIS — Z8669 Personal history of other diseases of the nervous system and sense organs: Secondary | ICD-10-CM

## 2017-03-02 MED ORDER — LISINOPRIL 10 MG OR TABS
10.0000 mg | ORAL_TABLET | Freq: Every day | ORAL | 0 refills | Status: DC
Start: 2017-03-02 — End: 2017-05-04

## 2017-03-02 MED ORDER — PROCHLORPERAZINE MALEATE 5 MG OR TABS
5.0000 mg | ORAL_TABLET | Freq: Four times a day (QID) | ORAL | 0 refills | Status: DC | PRN
Start: 2017-03-02 — End: 2017-03-17

## 2017-03-02 NOTE — Progress Notes (Signed)
Visit: Patient is here today to review the results of her MRI    Refills? NO  Referral? NO  Letter or Form? NO  Lab Results? NO    HEALTH MAINTENANCE:  Has the patient has this done since their last visit?  Cervical screening/PAP: N/A  Mammo: N/A  Colon Screen: N/A  Diabetic Eye Exam (If applicable): N/A    Have you seen a specialist since your last visit: No    Vaccines Due? No    Does patient have eCare?  yes    HM Due:   Health Maintenance   Topic Date Due    Zoster Vaccine (2 of 3) 05/30/2012    Depression Screening (PHQ-2)  10/28/2017    Lipid Disorders Screening  08/05/2020    Tetanus Vaccine  04/16/2022    Colorectal Cancer Screening (FOBT/FIT)  06/02/2025    Osteoporosis Screening  Completed    Pneumococcal Vaccine  Completed    Influenza Vaccine  Completed       PCP Verified?  Yes, Volkman, Mathews Robinsons, MD

## 2017-03-02 NOTE — Progress Notes (Signed)
Robin Hart is a 76 year old female who presents today for dizziness.       Assessment and Plan:    1. Vertigo, 2. 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 Cerebral artery disease 5. 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 Cause of her vertigo is still not entirely clear. I don't think the findings on MRA explain but given duration and severity of symptoms she needs to see neurology at this point. Referred urgently.  Question of migraine--would like to avoid NSAIDs. Could try compazine. If helpful then I will refill.   - REFERRAL TO NEUROLOGY  - Prochlorperazine Maleate 5 MG Oral Tab; Take 1 tablet (5 mg) by mouth every 6 hours as needed for nausea/vomiting or other (headache).  Dispense: 12 tablet; Refill: 0      3. Essential hypertension  We will treat this more aggressively. She will let me know in a week if the BPs are not coming down.   - Lisinopril 10 MG Oral Tab; Take 1 tablet (10 mg) by mouth daily. For 3 days then increase to twice a day.  Dispense: 180 tablet; Refill: 0  - REFERRAL TO NEUROLOGY    ---    Follow up: TBD        Subjective:     History of Present Illness:       Can't do PT for dizziness, it makes her too dizzy.   Continues to be intermittently and severely vertiginous.   Can't really do much of anything.   Once the dizziness kicks in, she gets nausea and some headache. But not like prior migraines.     Meclizine not helpful.   A can of coke is modestly helpful.   Has used aleve occ for back pain in the past. Has not tried it for the dizziness.   Remote hx of taking something for migraines in [place] but then they went away.   Does wonder if this could be some sort of migraine variant.     Brings home BP long.   BP goes up when she is very afflicted. Up to the 160s at home.   When feeling better, 130-140s.   Not much change on the lisinopril so far.     Audiogram and  Videonystagmography were normal.     MRI review. Evidence of hypertensive disease. No aneurysm, stroke, mass, or significant carotid disease.      ROS      Objective:     PE:    Vitals:    03/02/17 1247 03/02/17 1248   BP: 185/70 176/69   BP Cuff Size: Regular Regular   BP Site: Right Arm Right Arm   BP Position: Sitting Sitting   Pulse:  56   Resp:  12   Temp:  97.6 F (36.4 C)   TempSrc:  Temporal   SpO2:  99%   Weight: 121 lb (54.9 kg)      Body mass index is 23.63 kg/m.    Physical Exam   Constitutional: She is oriented to person, place, and time. She appears well-developed and well-nourished.   Appears uncomfortable   Eyes: Conjunctivae are normal.   Pulmonary/Chest: Effort normal.   Neurological: She is alert and oriented to person, place, and time.   Speech is clear, gaze symmetric, gait steady   Psychiatric: She has a normal mood and affect. Judgment normal.  Supplemental Information:    IMPRESSION:  1. Foci of apparent microhemorrhage in the pons and thalami bilaterally with   pattern most consistent with hypertensive disease.  2. Age-related cerebral volume loss and white matter FLAIR hyperintensities   without evidence of infarcts or mass lesions.    IMPRESSION:  No acute intracranial pathology. Mild-moderate cerebral and cerebellar volume   loss and white matter findings suggestive of mild-moderate chronic small   vessel ischemic disease.     Bilateral thalamic and pontine foci of susceptibility artifact, suggestive of   hemosiderin deposition from prior microhemorrhages or punctate calcifications.    Mild narrowing of the bilateral P2 posterior cerebral artery segments. No   intracranial aneurysm, AVM, stenosis, or occlusion.    The V4 segment of the left vertebral artery is hypoplastic after giving rise   to the left PICA.     Proximal left vertebral artery is poorly visualized, likely secondary to   artifact, however stenosis at this location is difficult to exclude.    Internal carotid  artery origin stenosis by NASCET criteria: No significant   stenosis by NASCET criteria.

## 2017-03-02 NOTE — Patient Instructions (Signed)
It was a pleasure to see you in clinic today. Your Medical Assistant was: Kelaiah Escalona, MA.          You can schedule an appointment to see us by calling  [phone] or via eCare.     If labs were ordered today the results are expected to be available via eCare 5 days later. Otherwise, result letters are mailed 7-10 days after your tests are completed. If your physician needs to change your care based on your results, you will receive a phone call to notify you. If you haven't heard from him/her and it has been more than 10 days please give us a call.     You may receive a survey in the mail asking how your experience has been with [facility].  Your input and opinions are very important to us.  I hope you are able to take the time to complete this survey.    Thank you for choosing [facility].

## 2017-03-06 ENCOUNTER — Encounter (INDEPENDENT_AMBULATORY_CARE_PROVIDER_SITE_OTHER): Payer: Self-pay | Admitting: Internal Medicine

## 2017-03-06 NOTE — Telephone Encounter (Signed)
Routing to referral team.

## 2017-03-07 NOTE — Telephone Encounter (Signed)
Sent an inbasket message to [facility] to see if they can offer any earlier appointment. Will wait for their response.

## 2017-03-08 ENCOUNTER — Telehealth (INDEPENDENT_AMBULATORY_CARE_PROVIDER_SITE_OTHER): Payer: Self-pay | Admitting: Orthopaedic Surgery

## 2017-03-08 ENCOUNTER — Encounter (INDEPENDENT_AMBULATORY_CARE_PROVIDER_SITE_OTHER): Payer: Self-pay | Admitting: Internal Medicine

## 2017-03-08 DIAGNOSIS — M5417 Radiculopathy, lumbosacral region: Secondary | ICD-10-CM

## 2017-03-08 DIAGNOSIS — M48061 Spinal stenosis, lumbar region without neurogenic claudication: Secondary | ICD-10-CM

## 2017-03-08 DIAGNOSIS — G8929 Other chronic pain: Secondary | ICD-10-CM

## 2017-03-08 NOTE — Telephone Encounter (Signed)
Called in rx

## 2017-03-08 NOTE — Telephone Encounter (Signed)
Please see ecare message from pt. Update on blood pressure.

## 2017-03-08 NOTE — Telephone Encounter (Signed)
Patient is having a flare up of her symptoms. She is having pain in her low back and down her right leg. She will be seeing a neurologist hopefully soon for her dizziness that started when she was in Physical Therapy. Physical Therapy was helping her but she has not been able to do r/t the dizziness.     She is asking for something for pain. Advised we'll call in tramadol to take 1/2-1 tablet tid prn . Advised to start with 1/2 tablet.     Advised to call us to check in soon if no improvement.

## 2017-03-08 NOTE — Telephone Encounter (Signed)
General Message:    Detailed Message: Patient called and her back is causing extreme pain and immobility. Please call patient back to go over what her options are. Thank you.    Return Call: General message okay

## 2017-03-09 MED ORDER — TRAMADOL HCL 50 MG OR TABS
25.0000 mg | ORAL_TABLET | Freq: Three times a day (TID) | ORAL | 0 refills | Status: DC | PRN
Start: 2017-03-09 — End: 2017-05-05

## 2017-03-09 NOTE — Telephone Encounter (Signed)
Robin Hart, it looks like part of the hold-up on this referral is that it is getting routed to [facility]. I do NOT want this pt in headache clinic, she should go to regular neuro.   Can you please get this ASAP referral to [facility] general neuro?     And if they are not cooperating I can call [facility] neuro back line to discuss a sooner appt than January. Please get me an extension and I will call. Thanks.     Pt needs appt in the next 1-2 wks.

## 2017-03-09 NOTE — Telephone Encounter (Signed)
Patient called following up with the referral. Referral is urgent and no instruction. Please call and advise.

## 2017-03-09 NOTE — Telephone Encounter (Signed)
Per 03/08/17 eCare TE; PCP does not want patient to be seen at [facility]. Patient should be seen at [facility]. Addresssing this issue on 03/08/17 TE.

## 2017-03-09 NOTE — Telephone Encounter (Signed)
Re-directed 03/02/17 Neurology to [facility]. Called their back line at [phone] and spoke to Hobbs. She will message the clinic referral coordinator regarding this urgent referral. We should hear back from them today.     Also called [facility] and spoke to Gay. She said that provider can call their backline, [phone], and ask to speak to provider, who she wants the patient to see, and discuss possible urgent appointment options. Thank you.     Juluis Rainier, PCP

## 2017-03-09 NOTE — Telephone Encounter (Signed)
To referral team to address

## 2017-03-10 NOTE — Telephone Encounter (Signed)
I sent a message to Dr. Karren Cobble to advise on scheduling options.   Once he advises- I will be reaching out to the patient one day next week.

## 2017-03-10 NOTE — Telephone Encounter (Addendum)
Patient called to check status. I updated her with where it is right now and asked her to call us back (option 8) by noon if she still has not heard anything. Patient agrees

## 2017-03-10 NOTE — Telephone Encounter (Signed)
[place] neuro reports the referral is now in med review for [facility] stroke clinic.   Attempted to call stroke clinic back line to ascertain how long until patient can get an appointment.  Was told will need to contact Fredna Dow who is the stroke coordinator to find out status.  Original intent when I wrote the referral 11/29 was for pt to be seen w/in 2 wks.   Am routing this TE to her and also leaving [phone] to follow up w/ me via our back line ([phone]).   OK to pull me from a room if needed.

## 2017-03-10 NOTE — Telephone Encounter (Signed)
Have attempted to call [facility] backline x2 with no answer.

## 2017-03-10 NOTE — Telephone Encounter (Signed)
I spoke w/ Linus Orn.   She expects to get med review response back by Tuesday.   Will book based on MD guidance.

## 2017-03-10 NOTE — Telephone Encounter (Signed)
Patient called to check status. I updated with most recent communication details.     Patient agrees to speak again on Monday if not by EOD.     -I did reassure patient that the intent of appt was 2 weeks from visit so we are not too far behind. Patient agrees

## 2017-03-13 NOTE — Telephone Encounter (Signed)
Patient called to check status, I informed that she should be contacted this week.

## 2017-03-15 NOTE — Telephone Encounter (Signed)
Pt has appt 12/14. Closing.

## 2017-03-17 ENCOUNTER — Ambulatory Visit (HOSPITAL_BASED_OUTPATIENT_CLINIC_OR_DEPARTMENT_OTHER): Payer: Medicare PPO | Attending: Neurology | Admitting: Neurology

## 2017-03-17 ENCOUNTER — Encounter (HOSPITAL_BASED_OUTPATIENT_CLINIC_OR_DEPARTMENT_OTHER): Payer: Self-pay | Admitting: Neurology

## 2017-03-17 ENCOUNTER — Telehealth (HOSPITAL_BASED_OUTPATIENT_CLINIC_OR_DEPARTMENT_OTHER): Payer: Self-pay

## 2017-03-17 ENCOUNTER — Telehealth (HOSPITAL_BASED_OUTPATIENT_CLINIC_OR_DEPARTMENT_OTHER): Payer: Self-pay | Admitting: Neurology

## 2017-03-17 VITALS — BP 162/52 | HR 52 | Temp 98.1°F | Ht 60.0 in | Wt 118.0 lb

## 2017-03-17 DIAGNOSIS — G43809 Other migraine, not intractable, without status migrainosus: Secondary | ICD-10-CM

## 2017-03-17 DIAGNOSIS — Z6823 Body mass index (BMI) 23.0-23.9, adult: Secondary | ICD-10-CM

## 2017-03-17 DIAGNOSIS — G43109 Migraine with aura, not intractable, without status migrainosus: Secondary | ICD-10-CM | POA: Insufficient documentation

## 2017-03-17 MED ORDER — NORTRIPTYLINE HCL 25 MG OR CAPS
50.0000 mg | ORAL_CAPSULE | Freq: Every evening | ORAL | 3 refills | Status: DC
Start: 2017-03-17 — End: 2017-03-21

## 2017-03-17 MED ORDER — PROCHLORPERAZINE MALEATE 5 MG OR TABS
ORAL_TABLET | ORAL | 3 refills | Status: DC
Start: 2017-03-17 — End: 2017-03-17

## 2017-03-17 MED ORDER — PROCHLORPERAZINE MALEATE 5 MG OR TABS
ORAL_TABLET | ORAL | 3 refills | Status: DC
Start: 2017-03-17 — End: 2017-05-05

## 2017-03-17 NOTE — Telephone Encounter (Signed)
(  TEXTING IS AN OPTION FOR [facility] ONLY)  Is this a [place] clinic? No      RETURN CALL: Detailed message on voicemail only      SUBJECT:  General Message     REASON FOR REQUEST: Prochlorperazine Maleate 5 MG Oral Tab  How often in a day   how much can be repeated?    MESSAGE: Please update medication to pharmacy:  [place] [id number] [phone] [place] [facility] [phone]

## 2017-03-17 NOTE — Progress Notes (Addendum)
[place] INITIAL VISIT  ______________________________________________  Referred by: Edsel Petrin, MD/ [facility]    HPI:  Robin Hart is a 76 year old R hand dominant female with history of hypertension and migraines who presents for evaluation of vertigo. Symptoms started two months ago. Started at the physical therapist and started working with an exercise ball. Feels like she's moving within her head. Intermittent. Typically gets dizzy, gets worse, gets nauseated, followed by headache. Blood pressure tends to be elevated during these episodes.Typically lasts for hours up to a day. Headache is frontal, pressure like. Pain ranges from 5-10/10 in severity. Vision is not clearly affected during episode further than her baseline issues with glaucoma and blurry vision related to dry contacts. Denies diplopia. No focal neurologic symptoms during these. Feels like the symptoms are related to position, but there isn't one clear position that causes them. Its more just any non-neurtral position makes her he did try scheduled Aleeve for 5 days to see if this would help with symptoms but it did not.     She has been previously evaluated by ENT.  She had normal hearing, normal EMG testing, and no pathology seen within her ear.  She had an MRI of the brain and MRA in November.  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  The consult question is whether these could be contributing to her vertigo.  Physical therapy was suggested however she reports that she is unable to complete this because she was too dizzy during physical therapy.  Dizziness is associated with nausea and headache.  She has previously tried meclizine for a couple of doses which was not helpful. She was prescribed prochlorperazine recently, but never actually took it.     Reports long history of motion sickness. Flying has always been difficult because of this. Previously saw a doctor in [place] for migraines, these resolved after some period of time, unclear whether this was related to medications or just time. Those migraines were associated with visual aura and unilateral pain. She has had a few episodes of visual aura only since moving to [place]. Those episodes have been separate from the current intermittent vertigo.     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, G89.29] 11/10/2016    Somatic dysfunction of right lower extremity [M99.06] 11/10/2016    Somatic dysfunction of pelvic region [M99.05] 11/10/2016    Somatic dysfunction of spine, lumbar [M99.03] 11/10/2016    Urge incontinence [N39.41] 10/03/2016    Loss of transverse plantar arch of right foot [M21.6X1] 09/22/2016    Functional dyspepsia [K30] 07/19/2016    Pelvic floor dysfunction [M62.89] 05/04/2015    Chronic idiopathic constipation [K59.04] 05/04/2015    Nausea [R11.0] 05/04/2015    OAB (overactive bladder) [N32.81] 03/05/2015     Doing 2 studies at [facility]      Psychophysiological insomnia [F51.04] 03/05/2015    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 For 3 days then increase to twice a day. (Patient taking differently: Take 10 mg by mouth 2 times a day. ) 180 tablet 0    Timolol Maleate 0.5 % Ophthalmic GEL FORMING SOLUTION Place 1 drop in each EYE every morning.  1    TraMADol HCl 50 MG Oral Tab Take 0.5-1 tablets (25-50 mg) by mouth 3 times a day as needed for pain. 30 tablet 0       Allergies:  Review of patient's allergies indicates:  No Known Allergies    Review of Systems:   ROS:  Constitutional: NEGATIVE  Eyes: yes, blurry vision even with glasses  Ears, Nose, Mouth, Throat: NEGATIVE  Cardiovascular: NEGATIVE  Respiratory: NEGATIVE  Gastrointestinal: yes, frequent nausea and/or vomiting  Genitourinary: yes, frequent urination, trouble holding urine  Musculoskeletal: yes, low back pain  Skin: NEGATIVE  Neurological: As noted in HPI above, And trouble sleeping/sleeping during the day  Psychiatric: NEGATIVE  Endocrine: NEGATIVE  Hematologic/Lymphatic: NEGATIVE  Allergic/Immunologic: NEGATIVE    Family History:  No family history   Daughter with type 1 diabetes.     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  EtOH - wine with dinner, intermittently  Tobacco - never  Illicits - MJ.     EXAM  BP 162/52    Pulse 52    Temp 98.1 F (36.7 C) (Temporal)    Ht 5' (1.524 m)    Wt 118 lb (53.5 kg)    SpO2 98%    BMI 23.05 kg/m   Gen: Patient seated comfortably in chair, talkative, pleasant, in NAD.  Resp: non-labored  CV: RRR, no carotid bruits  Skin: warm and well perfused    Neuro:  Mental status:  Awake, alert, and oriented to intact to person, place and time . Speech: fluent, no dysarthria. Naming intact. Short term memory intact. Attention intact.  Cranial nerves:   II: Visual fields full  III/IV/VI: EOM: intact, no nystagmus, no skew  V: Facial sensation: intact to light touch bilaterally   VII: Facial expressions: symmetric  VIII: Hearing: intact to finger rub  IX/X: Palate: elevates symmetrically    XII: Tongue protrusion: midline  Motor exam:     L R   Delt 5 5   Tricep 5 5   Bicep 5 5   w/e 5 5   w/f 5 5   I/O 5 5   H/f 5 5   K/e 5 5   K/f 5 5   D/f 5 5   P/f 5 5     no pronator drift   no Orbiting:    Normal tone bilateral  no spasticity, rigidity, tremor.   no abnormal movements seen during my exam.   Sensation: INTACT sensation to light touch in all 4 extremities.   DTRs:                         R    ___    L                 2 I  I_'_'_I         I 2                   I__2___ I ___2__I                                  I                                   I                              ___I___                         I              I                    2   I              I   2                    __ I 2        2 I__                                      Cerebellum: FTN and HTS INTACT. Romberg INTACT  Gait: Normal narrowbased gait. Able to complete tandem gait.       Prior W/u:  Imaging  MRI brain and MRA from 02/27/17 were personally reviewed.  The MRI has no evidence of prior stroke specifically there are no abnormalities seen in the posterior circulation.  There is a mild amount of supratentorial white matter change consistent with this small vessel changes.  The MRA does show that the left vertebral artery is smaller on the left than the right, however is favored to be a congential variation rather than an abnormality which would be of any significant consequence in this setting.      ASSESSMENT/RECOMMENDATIONS:  Morrison Masser is a 76 year old R hand dominant female with history of hypertension and migraines who presents for evaluation of vertigo.  The etiology of her vertigo is unclear at this point.  She has had normal vestibular testing with ENT.  The changes described on her MRI/MRI are unlikely to be contributing to her vertigo.  There is no evidence of parenchymal abnormalities in or around the area of the brainstem or cerebellum which would contribute to vertigo.      Given her  history of migraine, and the persistence of symptoms from hours to day and the presence of headache associated with these episodes of vertigo I do wonder about the possibility of a migraine variant causing or contributing to her symptoms.  We have offered her migraine treatment with a prophylactic and abortive agent.      We discussed to potential prophylactic treatment including nortriptyline and topiramate.  After discussion of the side effects of both medications patient will like to try nortriptyline.  We will start her at 25 mg daily at bedtime which she will continue for one week.  If symptoms persist and there are no significant side effects  related to medication she should increase the dose to 50 mg at bedtime.  We discussed that this medications will likely take weeks to work, but that she should continue to take it unless there are significant side effects.  We have also provided her with prescription for prochlorperazine to be used as an abortive agent.  We chose this agent in particular because nausea as a significant component of her symptoms.  Prescribed 5 mg tabs.  She can take 1 or 2 tabs at the onset of symptoms.  It would be good to document whether the abortive medications are working.      Her imaging does have mild white matter changes which are likely related to his factors like hypertension.  She should continue to work with her primary care provider to manage her blood pressure.  It would also be interesting to see if these symptoms have any association with blood pressure.  There is not clearly a pattern seen in her current blood pressure diary, but more information/data points over time will be helpful.  We will see her back in the stroke clinic in 3 months to follow up the symptoms.  Should she need ongoing follow-up for this issue after that we will refer her to the general neurology or headache clinic.          Elyn Peers, MD  Vascular Neurology Fellow

## 2017-03-17 NOTE — Telephone Encounter (Signed)
Pt called looking for her insurance card she lost today in clinic.  It has not been turned in.

## 2017-03-17 NOTE — Patient Instructions (Addendum)
You were seen in the stroke clinic by Dr. Lovena Le and Dr. Donneta Romberg.     We reviewed your imaging (MRI of the Brain and the imaging of the blood vessels). There is no evidence of a stroke or any other explanation for your dizziness.  We don't think there is a vascular reason for your symptom. We are also reassured by your normal neurologic examination.     The ENT physician recommended vestibular PT. We think this would be a good idea to try.     We do wonder about the possibility of a migraine variant causing your symptoms. Sometimes migraines can come on with extra symptoms like dizziness.     We think you would benefit from a daily medication for migraine prevention.     We recommend trying nortriptyline. This medication was initially developed as an antidepressant medication, but we have discovered that it works well for neuropathic and nerve pain as well as migraines. The main side effects of this medication are drowsiness (which is why we dose it at night) and less commonly orthostatic hypotension (lower blood pressure with standing).    Schedule:  Week one: Take nortriptyline 25 mg at night.   Week two: If you still have symptoms, and no side effects. Increase the dose of nortriptyline 50 mg at night.     Another prophylactic medication we could consider if nortriptyline is ineffective or you have side effects would be topiramate (Topamax). This is a seizure medication that we have discovered also helps with headaches.      We also think that you would benefit from an abortive medication, which is a medication that you would take at the first sign of symptoms. We would choose an anti-emetic (anti-nausea) medication. We would choose compazine. Take at the first sign of dizziness and nausea and see if it helps.     Please call or send an eCare message with any questions or concerns.

## 2017-03-20 ENCOUNTER — Telehealth (HOSPITAL_BASED_OUTPATIENT_CLINIC_OR_DEPARTMENT_OTHER): Payer: Self-pay | Admitting: Neurology

## 2017-03-20 DIAGNOSIS — G43109 Migraine with aura, not intractable, without status migrainosus: Secondary | ICD-10-CM

## 2017-03-20 NOTE — Telephone Encounter (Signed)
Pt called to check on status of prior authorization.  Let her know that we have not yet heard back from providers and that we will call her when we hear.

## 2017-03-20 NOTE — Telephone Encounter (Signed)
Received fax from patient's pharmacy. Prescription Nortriptyline is not covered by plan. Will need to call plan at [phone] to initiate prior authorization or call/fax pharmacy to change medication.    Last seen on 03/17/17 for evaluation of vertigo.     Fax scanned into Environmental health practitioner.    Forwarding to ARNP and Fellow to inquire whether or not to initiate prior authorization or change medication

## 2017-03-20 NOTE — Progress Notes (Signed)
Attending Statement  I saw and evaluated the patient. I have reviewed the findings and assessment in the stroke fellow's documentation and I do agree with the plan as documented in the fellow's note.     ================================  Anaclara Acklin, MD  Neurology/Stroke Attending Physician  ================================

## 2017-03-21 MED ORDER — AMITRIPTYLINE HCL 10 MG OR TABS
ORAL_TABLET | ORAL | 3 refills | Status: DC
Start: 2017-03-21 — End: 2017-06-01

## 2017-03-21 NOTE — Telephone Encounter (Signed)
I called to complete prior auth. Insurance denied nortriptyline because it is nonformulary, but were ok with an alternative medication in the same medication family, amitriptyline.     I will change the prescription to amitriptyline 10 mg QHS for one week then increase to 20 mg QHS. I'll send the new medication to her pharmacy. I attempted to give the patient a call informing her of the change, but I did not get an answer. I'll also send her an eCare message.

## 2017-03-21 NOTE — Telephone Encounter (Signed)
Actually, patient just called me back. Voiced understanding of the new plan.

## 2017-03-24 ENCOUNTER — Encounter (HOSPITAL_BASED_OUTPATIENT_CLINIC_OR_DEPARTMENT_OTHER): Payer: Self-pay | Admitting: Neurology

## 2017-03-24 NOTE — Telephone Encounter (Signed)
Contacted [facility]. Answered questions required for Prior Authorization. Authorization approved. PA reference # [id number]. [place] now to inform them of approval.

## 2017-03-24 NOTE — Telephone Encounter (Signed)
Pharmacist at [facility] states Amitriptyline is also requiring prior authorization. States it might be related to the number of pills prescribed. Provided number to [facility] [phone]. RN will try to contact now.

## 2017-03-24 NOTE — Telephone Encounter (Signed)
Attempted to contact Pharmacy. LVM for pharmacy to call RN line. Pharmacy opens at 9:00 and can try again later.

## 2017-03-24 NOTE — Telephone Encounter (Signed)
[facility] called in regards to the Nortriptyline. They need to know if provider would like to continue with order or cancel it since they needed additional information for the prior authorization. Please  provided IHW#[id number]. She will also fax a request which can be faxed back instead of calling. Thank you!

## 2017-03-27 NOTE — Telephone Encounter (Signed)
Contacted [facility] and informed them Rx for Nortriptyline should be discontinued.

## 2017-04-03 ENCOUNTER — Encounter (HOSPITAL_BASED_OUTPATIENT_CLINIC_OR_DEPARTMENT_OTHER): Payer: Self-pay | Admitting: Neurology

## 2017-04-11 ENCOUNTER — Ambulatory Visit: Payer: Medicare PPO | Attending: Otolaryngology

## 2017-04-11 DIAGNOSIS — N3941 Urge incontinence: Secondary | ICD-10-CM | POA: Insufficient documentation

## 2017-04-11 DIAGNOSIS — N393 Stress incontinence (female) (male): Secondary | ICD-10-CM | POA: Insufficient documentation

## 2017-04-11 DIAGNOSIS — R42 Dizziness and giddiness: Secondary | ICD-10-CM | POA: Insufficient documentation

## 2017-04-11 DIAGNOSIS — R531 Weakness: Secondary | ICD-10-CM | POA: Insufficient documentation

## 2017-04-11 NOTE — Progress Notes (Signed)
[facility] VESTIBULAR NOTE    GENERAL VISIT      Encounter Diagnosis   Name Primary?    Dizziness and giddiness Yes     Referring Provider: No ref. provider found    VISIT COUNT   2    G-CODE VISIT COUNT  1          Past Medical History:   Diagnosis Date    Allergic rhinitis due to allergen     Arthritis     Cancer ([place])     skin cell carcinoma     Constipation     Diverticulosis     Glaucoma     H. pylori infection     Headache     History of diarrhea     History of irritable bowel syndrome     Hypertension     Lipidemia     Osteoporosis     Primary insomnia        Social History     Social History Narrative    No narrative on file       SUBJECTIVE:   Here today for follow up x 1 month later.  Reports she has had an MRI and has followed up with neurology since her last vist.  Is now taking BP & migraine medications.  BP x 3 readings as follows: 151/55, 139/63 and 140/69 prior to tx.      OBJECTIVE / TREATMENT:  Neuromuscular re-education 55 minutes. Provided edu regarding migraine and migraine related dizziness and balance impairment followed by balance assessment. mCTSIB WFL on all conditions.  Static stance with head turns is impaired on the floor and or foam with increased complaints of dizziness and imbalance.   Pt scored 27/30 on FGA with deficits noted when walking with head turns and EC.  Primary subjective complaints include LOB/unsteadiness with head turns consistent with assessment.     HEP initiated x 1 x 2 viewing exercises to address symptoms of dizziness and imbalance.  Increased symptoms reported with exercise and pt unable to perform 60 repetitions x 2 without needing to stop due to dizziness.  Provided surface orientation strategies for pt to implement when symptomatic.  Recommend 4-6 additional visits to address balance deficits associated with head turns and dizziness.    ASSESSMENT:   Mild balance deficits with increased symptoms of dizziness  identified primarily associated with head turns.  HEP initiated.  Pt advised to progress as she is able.          PLAN:  Upgrade tx plan as able    Willaim Sheng ANN

## 2017-04-13 ENCOUNTER — Encounter (HOSPITAL_BASED_OUTPATIENT_CLINIC_OR_DEPARTMENT_OTHER): Payer: Self-pay | Admitting: Diagnostic Radiology

## 2017-04-17 ENCOUNTER — Encounter (HOSPITAL_BASED_OUTPATIENT_CLINIC_OR_DEPARTMENT_OTHER): Payer: Self-pay | Admitting: Internal Medicine

## 2017-04-17 DIAGNOSIS — Z1231 Encounter for screening mammogram for malignant neoplasm of breast: Secondary | ICD-10-CM

## 2017-04-18 NOTE — Addendum Note (Signed)
Addended by: Willaim Sheng ANN on: 04/18/2017 03:20 PM     Modules accepted: Orders

## 2017-04-19 ENCOUNTER — Encounter (HOSPITAL_BASED_OUTPATIENT_CLINIC_OR_DEPARTMENT_OTHER): Payer: Medicare PPO

## 2017-04-21 ENCOUNTER — Ambulatory Visit (INDEPENDENT_AMBULATORY_CARE_PROVIDER_SITE_OTHER): Payer: Medicare PPO | Admitting: Orthopaedic Surgery

## 2017-04-21 VITALS — BP 153/72 | HR 58 | Ht 60.0 in | Wt 120.0 lb

## 2017-04-21 DIAGNOSIS — M9983 Other biomechanical lesions of lumbar region: Secondary | ICD-10-CM

## 2017-04-21 DIAGNOSIS — Z6823 Body mass index (BMI) 23.0-23.9, adult: Secondary | ICD-10-CM

## 2017-04-21 DIAGNOSIS — M4310 Spondylolisthesis, site unspecified: Secondary | ICD-10-CM

## 2017-04-21 DIAGNOSIS — M5417 Radiculopathy, lumbosacral region: Secondary | ICD-10-CM

## 2017-04-21 DIAGNOSIS — G8929 Other chronic pain: Secondary | ICD-10-CM

## 2017-04-21 DIAGNOSIS — M5441 Lumbago with sciatica, right side: Secondary | ICD-10-CM

## 2017-04-21 DIAGNOSIS — M48061 Spinal stenosis, lumbar region without neurogenic claudication: Secondary | ICD-10-CM

## 2017-04-21 DIAGNOSIS — M533 Sacrococcygeal disorders, not elsewhere classified: Secondary | ICD-10-CM

## 2017-04-21 NOTE — Progress Notes (Signed)
04/21/2017    Burr Medico  [id number]    IDENTIFYING DATA:  Robin Hart is a pleasant 77 year old female who was last seen in clinic on 12/29/2016.  She presents today for reevaluation of back pain and also discomfort in the right hip and leg    INTERIM HISTORY:  Sadiya returns to follow-up regarding bilateral low back pain and also referral towards right hip calf and foot intermittently.  She is overall very pleased with [facility], and feels that she is making improvements.  She is noticing quite a bit of benefit early on, and then had one episode sitting on an exercise ball doing some shift movements that triggered a headache followed by dizziness.  There is no fall or other event at all that she can recall.  Regardless she's been dealing with dizziness which is improved in his been apparently diagnosed as a migraine variant.  Her blood pressure was also affected which has been higher.  Due to the dizziness she is also had to modify some of her exercises but she continues to work with physical therapy and is quite hopeful that she will have continued overall improvement which has been noticeable.  Her pain varies now anywhere from 4-8/10.  Does have discomfort in the back some symptoms in the hip and distally as well.      PMH/PSH/MEDICATIONS/FAMILY MEDICAL HISTORY/SOCIAL HISTORY:  See chart. Pertient changes described above.     MEDICATIONS/ALLERGIES:  See chart. No changes.     REVIEW OF SYSTEMS:  14 point review of systems negative other than mentioned above.    PHYSICAL EXAMINATION:  General: Awake, alert, and oriented, no apparent distress, pleasant, and cooperative  Vitals: Blood pressure 153/72, pulse 58, height 5' (1.524 m), weight 120 lb (54.4 kg).Body mass index is 23.44 kg/m.  Neurologic/Musculoskeletal: Gait and movements are perhaps slightly antalgic.  Focal provocative testing was not repeated today     IMAGING:  MRI lumbar spine 11/15/2016:  IMPRESSION:   1.  Grade 1 spondylolisthesis L4 on L5 with moderate disc degeneration   and severe facet arthropathy causes moderate right lateral recess and   foraminal stenosis impinging on the descending right L5 nerve root   and exiting right L4 nerve root.   2. Severe bilateral L5-S1 and L3-L4 facet arthropathy.   3. Grade 1 spondylolisthesis L4 on L5, without spondylolysis   identified, likely related to ligamentous laxity from facet   arthropathy.   4. Probable transverse fracture S3. Sacrum is incompletely assessed   on this exam. Recommend clinical correlation regarding the need for   formal imaging of the sacrum.   5. 1 cm multiloculated ganglion is noted in the interspinous space   between the L4 and L5 spinous processes likely related to chronic   repetitive trauma.    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 We discussed her symptoms and also response to work with physical therapy which has been beneficial.  She has required some modifications and perhaps slowing over progressive exercises, but she is improving and feels that she is managing okay at this point and hopeful.  2. We again discussed the possibility of interventional procedures such as transforaminal injections if  sciatica persists, and possibly targeting her facet joints.  Both agreed to hold off for now  3. Follow-up me in about 8 weeks or sooner if needed.  Call with any questions.        Greater than 15 minutes were spent with the patient in clinic today, with more than 50% of the time face-to-face in consultation, education, and coordination of care.        Benzion Mesta B.HGuido Sander, D.O.   Board certified - Physical Medicine &  Rehabilitation      Portions of this document were created with voice-recognition software and may contain occasional errant or misspelled words.

## 2017-04-21 NOTE — Progress Notes (Signed)
Here to discuss: Lower back pain    Reports:   Pain Level: 4-8/10 when aggravated, 6/10 average   Aggravated by: sitting on a hard chair  Directional Preference: Leaning forward  Employment Status:     Last seen: 12/29/16  Treatments include:   -Medication: Aleve PRN    -Injections: No   -Therapies: PT, 1x a week, [employer], [place], [place]   -Surgery: No   -Imaging: No

## 2017-04-26 ENCOUNTER — Ambulatory Visit (HOSPITAL_BASED_OUTPATIENT_CLINIC_OR_DEPARTMENT_OTHER): Payer: Medicare PPO

## 2017-04-26 DIAGNOSIS — R42 Dizziness and giddiness: Secondary | ICD-10-CM

## 2017-04-26 NOTE — Progress Notes (Signed)
[facility] VESTIBULAR NOTE    GENERAL VISIT      No diagnosis found.  Referring Provider: No ref. provider found    VISIT COUNT   3    G-CODE VISIT COUNT  1                  Past Medical History:   Diagnosis Date    Allergic rhinitis due to allergen     Arthritis     Cancer ([place])     skin cell carcinoma     Constipation     Diverticulosis     Glaucoma     H. pylori infection     Headache     History of diarrhea     History of irritable bowel syndrome     Hypertension     Lipidemia     Osteoporosis     Primary insomnia          SUBJECTIVE:   Here today and reports she has been performing her exercises 80% of the time. Feels like the exercises are helping to help manage symptoms of dizziness when symptoms occur.  Reports ongoing symptoms of migraine.        OBJECTIVE / TREATMENT:  Neuromuscular re-education 55 minutes.  Reviewed HEP.  Pt demonstrating excellent technique.  Able to advance HEP to include reading.  Increased symptoms following  x 1 x 2 viewing (while reading) with occasional LOB observed when performing on foam in a modified CR position. Pt to practice at  Home and progress stance as she is able.  Initiated walking with head turns.  Side stepping observed with head turns to the left greater than the right with increased symptoms.  Pt advised to practice in her home environment.  Postural strategies reviewed to regain balance, pt able to demonstrate effective strategies as/if needed.  Progressed habituation exercises to include rolling.  Increased symptoms reported when turning to the right and or left.      ASSESSMENT:   Pt reports that she is getting better.  Able to progress HEP today.  Reports increased symptoms with new HEP exercises.  Advised to progress as able.      PLAN:  Progress as able.    Willaim Sheng ANN

## 2017-05-03 ENCOUNTER — Encounter (HOSPITAL_BASED_OUTPATIENT_CLINIC_OR_DEPARTMENT_OTHER): Payer: Self-pay

## 2017-05-03 ENCOUNTER — Telehealth (INDEPENDENT_AMBULATORY_CARE_PROVIDER_SITE_OTHER): Payer: Self-pay | Admitting: Internal Medicine

## 2017-05-03 DIAGNOSIS — I10 Essential (primary) hypertension: Secondary | ICD-10-CM

## 2017-05-03 NOTE — Telephone Encounter (Signed)
Received call from pt, stated she is monitoring BP at home and is currently taking Lisinopril 10 mg tabs, twice daily.     Readings have lingered around 160's over 80's. Reported a bit of a cough since starting prescription as well.     Declined CCL Nurse.     Sending to PCP.

## 2017-05-04 ENCOUNTER — Ambulatory Visit: Payer: Self-pay

## 2017-05-04 MED ORDER — LOSARTAN POTASSIUM 100 MG OR TABS
100.0000 mg | ORAL_TABLET | Freq: Every day | ORAL | 1 refills | Status: DC
Start: 2017-05-04 — End: 2017-10-24

## 2017-05-04 NOTE — Telephone Encounter (Signed)
Regarding: FAX: [facility] "my blood pressure is 204/93"  ----- Message from Idamae Schuller sent at 05/04/2017  8:44 PM PST -----  FAX: [facility] "my blood pressure is 204/93"

## 2017-05-04 NOTE — Telephone Encounter (Signed)
Please have her switch to losartan 100 mg once daily.  The prescription is sent in.  This is less likely to cause a cough and is a higher dose equivalent in the lisinopril she was taking previously.  Please ask her to schedule an appointment within about a month to reassess blood pressure and whether this regimen is appropriate for her.

## 2017-05-04 NOTE — Telephone Encounter (Signed)
RN spoke with pt who states she was switched from lisinopril to losartan recently and was took her first dose of losartan tonight at [facility]. Took BPs about a half hour ago and were 190s-200s over 70s-80s. Pt states she had been feeling dizzy lately but that is why she sought medicine for her BP to begin with.     Denies any new or worsening sx at this time.     Pt will monitor for sx tonight. Discussed sx that would warrant an ED visit.   Advised pt call back if symptoms change or worsen or with any questions or concerns.     [facility]: please touch base with pt asap  Thank you.     Reason for Disposition   BP  >= 160/100    Protocols used: HIGH BLOOD PRESSURE-ADULT-AH

## 2017-05-04 NOTE — Telephone Encounter (Signed)
Pt informed of message below     The patient indicates understanding of these issues and agrees with the plan.    Closing TE

## 2017-05-05 ENCOUNTER — Telehealth (INDEPENDENT_AMBULATORY_CARE_PROVIDER_SITE_OTHER): Payer: Self-pay | Admitting: Internal Medicine

## 2017-05-05 ENCOUNTER — Encounter (INDEPENDENT_AMBULATORY_CARE_PROVIDER_SITE_OTHER): Payer: Self-pay | Admitting: Internal Medicine

## 2017-05-05 ENCOUNTER — Ambulatory Visit (INDEPENDENT_AMBULATORY_CARE_PROVIDER_SITE_OTHER): Payer: Medicare PPO | Admitting: Internal Medicine

## 2017-05-05 VITALS — BP 181/76 | HR 60 | Temp 98.7°F | Resp 14

## 2017-05-05 DIAGNOSIS — I10 Essential (primary) hypertension: Secondary | ICD-10-CM

## 2017-05-05 LAB — PR U/A AUTO W/MICRO, ONSITE
Bilirubin, Urine: NEGATIVE
Glucose, Urine: NEGATIVE mg/dL
Ketones, URN: NEGATIVE mg/dL
Nitrite, URN: NEGATIVE
Protein: NEGATIVE mg/dL
Specific Gravity, Urine: 1.02 (ref 1.005–1.030)
Urobilinogen, URN: 0.2 E.U./dL (ref 0.2–1.0)
pH, URN: 7.5 (ref 5.0–8.0)

## 2017-05-05 LAB — PARATHYROID HORMONE: Parathyroid Hormone: 60 pg/mL (ref 12–88)

## 2017-05-05 LAB — COMPREHENSIVE METABOLIC PANEL
ALT (GPT): 16 U/L (ref 7–33)
AST (GOT): 28 U/L (ref 9–38)
Albumin: 4.2 g/dL (ref 3.5–5.2)
Alkaline Phosphatase (Total): 85 U/L (ref 49–199)
Anion Gap: 7 (ref 4–12)
Bilirubin (Total): 0.3 mg/dL (ref 0.2–1.3)
Calcium: 9.3 mg/dL (ref 8.9–10.2)
Carbon Dioxide, Total: 31 meq/L (ref 22–32)
Chloride: 103 meq/L (ref 98–108)
Creatinine: 0.51 mg/dL (ref 0.38–1.02)
GFR, Calc, African American: >60 mL/min/{1.73_m2} (ref >59)
GFR, Calc, European American: >60 mL/min/{1.73_m2} (ref >59)
Glucose: 94 mg/dL (ref 62–125)
Potassium: 3.8 meq/L (ref 3.6–5.2)
Protein (Total): 6.3 g/dL (ref 6.0–8.2)
Sodium: 141 meq/L (ref 135–145)
Urea Nitrogen: 18 mg/dL (ref 8–21)

## 2017-05-05 LAB — TSH WITH REFLEXIVE FREE T4: TSH with Reflexive Free T4: 1.273 u[IU]/mL (ref 0.400–5.000)

## 2017-05-05 LAB — CBC (HEMOGRAM)
Hematocrit: 43 % (ref 36–45)
Hemoglobin: 14.2 g/dL (ref 11.5–15.5)
MCH: 28.9 pg (ref 27.3–33.6)
MCHC: 33.3 g/dL (ref 32.2–36.5)
MCV: 87 fL (ref 81–98)
Platelet Count: 232 10*3/uL (ref 150–400)
RBC: 4.91 10*6/uL (ref 3.80–5.00)
RDW-CV: 13.1 % (ref 11.6–14.4)
WBC: 5.62 10*3/uL (ref 4.3–10.0)

## 2017-05-05 MED ORDER — HYDROCHLOROTHIAZIDE 25 MG OR TABS
25.0000 mg | ORAL_TABLET | Freq: Every day | ORAL | 0 refills | Status: DC
Start: 2017-05-05 — End: 2017-07-27

## 2017-05-05 NOTE — Patient Instructions (Signed)
It was a pleasure to see you in clinic today. Your Medical Assistant was: Benoit Meech, MA.          You can schedule an appointment to see us by calling  [phone] or via eCare.     If labs were ordered today the results are expected to be available via eCare 5 days later. Otherwise, result letters are mailed 7-10 days after your tests are completed. If your physician needs to change your care based on your results, you will receive a phone call to notify you. If you haven't heard from him/her and it has been more than 10 days please give us a call.     You may receive a survey in the mail asking how your experience has been with [facility].  Your input and opinions are very important to us.  I hope you are able to take the time to complete this survey.    Thank you for choosing [facility].

## 2017-05-05 NOTE — Telephone Encounter (Signed)
Patient called regarding triage 05/04/17    See RN note    Erik Obey, RN   05/04/17 9:03 PM   Note      RN spoke with pt who states she was switched from lisinopril to losartan recently and was took her first dose of losartan tonight at [facility]. Took BPs about a half hour ago and were 190s-200s over 70s-80s. Pt states she had been feeling dizzy lately but that is why she sought medicine for her BP to begin with.     Denies any new or worsening sx at this time.     Pt will monitor for sx tonight. Discussed sx that would warrant an ED visit.   Advised pt call back if symptoms change or worsen or with any questions or concerns.     [facility]: please touch base with pt asap  Thank you.     Reason for Disposition   BP  >= 160/100    Protocols used: HIGH BLOOD PRESSURE-ADULT-AH            Patient states that she still is not experiencing any HTN symptoms. States that years ago she was treated with Losartan it was paired with another medication. I mentioned HCTZ and patient states that was most likely it.       To PCP to advise if patient needs appt or if additional med can be sent in.

## 2017-05-05 NOTE — Telephone Encounter (Signed)
Please ask her to come in this afternoon for a recheck. I have an opening at 3:50.

## 2017-05-05 NOTE — Progress Notes (Signed)
Robin Hart is a 77 year old female who presents today for high blood pressure.       Assessment and Plan:    1. Accelerated hypertension  BPs were 130s 6 months ago. Underlying reason for this progression is unclear. With the onset of dizziness, initially attributed to discomfort, then concern for cerebrovascular disease, has had MRI and seen neurology and this is not their impression.   We will add full dose HCTZ. Broaden lab w/u. Also send renal artery duplex. Close f/u next week--call on Monday with readings, OV on Thurdsay.   She was in agreement w/ plan.   - HydroCHLOROthiazide 25 MG Oral Tab; Take 1 tablet (25 mg) by mouth daily.  Dispense: 90 tablet; Refill: 0  - Comprehensive Metabolic Panel  - U/A AUTO W/MICRO, ONSITE  - TSH with Reflexive Free T4  - Parathyroid Hormone  - CBC  - REFERRAL TO VASCULAR LAB  - EKG 12-LEAD    ---          Subjective:     History of Present Illness:       She's been doing vestibular therapy for the dizziness but overall that seems to be working for her. And PT for the back.     Blood pressure is the issue. Wasn't yet with good control on lisinopril, 053Z systolic, and had a cough. Called in and we switched to losartan.   Took last dose of lisinopril yesterday AM, later in the day, checked her BP was super high 200/80, she felt fine, she was surprised but kept checking and it was that high in both arms. So she took her first dose of losartan last night.     She went back through her records and she used to take losartan and hctz and then the two together.     She is concerned about the numbers but feels absolutely fine. She has started to notice trace (sock edge line) edema in the legs.      Review of Systems   Constitution: Negative for malaise/fatigue.   Cardiovascular: Negative for chest pain and dyspnea on exertion.   Neurological: Negative for headaches.       Objective:     PE:    Vitals:    05/05/17 1546   BP: 181/76   BP Cuff Size: Regular   BP Site: Right Arm    BP Position: Sitting   Pulse: 60   Resp: 14   Temp: 98.7 F (37.1 C)   TempSrc: Temporal   SpO2: 100%     There is no height or weight on file to calculate BMI.    Physical Exam   Constitutional: She is oriented to person, place, and time. She appears well-developed and well-nourished. No distress.   HENT:   Mouth/Throat: No oropharyngeal exudate.   Eyes: Conjunctivae are normal.   Cardiovascular: Normal rate, regular rhythm and normal heart sounds.    Pulmonary/Chest: Effort normal and breath sounds normal.   Neurological: She is alert and oriented to person, place, and time.   Psychiatric: She has a normal mood and affect. Her behavior is normal. Judgment normal.      EKG (my review) normal sinus rhythm, poss 1 box ST change isolated in V3, no other changes. No LVH. No Q waves.       Supplemental Information:

## 2017-05-05 NOTE — Progress Notes (Signed)
Visit:   Chief Complaint   Patient presents with    Blood Pressure     Patient is here today to discuss elevated blood pressure     Patient states last night, her blood pressure was 206/91    Refills? NO  Referral? NO  Letter or Form? NO  Lab Results? NO    HEALTH MAINTENANCE:  Has the patient has this done since their last visit?  Cervical screening/PAP: N/A  Mammo: N/A  Colon Screen: N/A  Diabetic Eye Exam (If applicable): N/A    Have you seen a specialist since your last visit: No    Vaccines Due? No    Does patient have eCare?  yes    HM Due:   Health Maintenance   Topic Date Due    Zoster Vaccine (2 of 3) 05/30/2012    Depression Screening (PHQ-2)  10/28/2017    Lipid Disorders Screening  08/05/2020    Tetanus Vaccine  04/16/2022    Osteoporosis Screening  Completed    Pneumococcal Vaccine  Completed    Influenza Vaccine  Completed       PCP Verified?  Yes, Volkman, Mathews Robinsons, MD

## 2017-05-05 NOTE — Telephone Encounter (Signed)
Patient agrees.     Scheduled

## 2017-05-08 ENCOUNTER — Telehealth (INDEPENDENT_AMBULATORY_CARE_PROVIDER_SITE_OTHER): Payer: Self-pay | Admitting: Internal Medicine

## 2017-05-08 NOTE — Telephone Encounter (Signed)
Brief call to patient re: blood pressures.   Have bounced around 91 to 150 systolic  Diastolic to 56-97X.   Maybe a trend towards it going down.   Will f/u in person on the 7th as planned.

## 2017-05-10 ENCOUNTER — Encounter (HOSPITAL_BASED_OUTPATIENT_CLINIC_OR_DEPARTMENT_OTHER): Payer: Self-pay

## 2017-05-11 ENCOUNTER — Ambulatory Visit (INDEPENDENT_AMBULATORY_CARE_PROVIDER_SITE_OTHER): Payer: Medicare PPO | Admitting: Internal Medicine

## 2017-05-11 ENCOUNTER — Encounter (INDEPENDENT_AMBULATORY_CARE_PROVIDER_SITE_OTHER): Payer: Self-pay | Admitting: Internal Medicine

## 2017-05-11 ENCOUNTER — Ambulatory Visit: Payer: Medicare PPO | Attending: Otolaryngology

## 2017-05-11 VITALS — BP 161/70 | HR 70 | Temp 98.1°F | Resp 13

## 2017-05-11 DIAGNOSIS — R0989 Other specified symptoms and signs involving the circulatory and respiratory systems: Secondary | ICD-10-CM

## 2017-05-11 DIAGNOSIS — R531 Weakness: Secondary | ICD-10-CM | POA: Insufficient documentation

## 2017-05-11 DIAGNOSIS — R42 Dizziness and giddiness: Secondary | ICD-10-CM | POA: Insufficient documentation

## 2017-05-11 DIAGNOSIS — I10 Essential (primary) hypertension: Secondary | ICD-10-CM

## 2017-05-11 DIAGNOSIS — N3941 Urge incontinence: Secondary | ICD-10-CM | POA: Insufficient documentation

## 2017-05-11 DIAGNOSIS — N393 Stress incontinence (female) (male): Secondary | ICD-10-CM | POA: Insufficient documentation

## 2017-05-11 NOTE — Progress Notes (Signed)
Robin Hart is a 77 year old female who presents today for hypertension.       Assessment and Plan:    1. Labile blood pressure, 2. Hypertension, accelerated  BPs seem to have leveled off overall. Based on home readings, would not add another agent as she would be at risk of hypotension.   Expressed precaution that I have had difficulty/inability in the past to obtain ambulatory BP monitoring, but we will try again.   Continue checking BP but just 1-2x/day.   Get renal duplex next week.   F/u 2/28 as scheduled.   OK to stop amitryptaline.  - REFERRAL TO AMB BP MONITORING    ---          Subjective:     History of Present Illness:       At her last visit, BPs as high as 283 systolic.   In addition to cross over and increase from lisinopril to losartan, we added HCTZ.   Home BPs downtrended and are now reading 100-120s, with occ readings as high as 140.   She is checking multiple times per day.   Today only, she went to her vestibular PT appointment and BP was 158-160 before the session started.   She did not feel anxious or distressed to be there.   She would be interested in ambulatory BP monitoring if it could be arranged.     She would like to stop amitryptaline. It was started to address a possible vertiginous migraine contributing to her vertigo symptoms this fall. These are now somewhat improved.     Review of Systems   Constitution: Negative.       Objective:     PE:    Vitals:    05/11/17 1542 05/11/17 1544   BP: 184/70 161/70   BP Cuff Size: Regular Regular   BP Site: Right Arm Right Arm   BP Position: Sitting Sitting   Pulse: 70    Resp: 13    Temp: 98.1 F (36.7 C)    TempSrc: Temporal    SpO2: 100%      There is no height or weight on file to calculate BMI.    Physical Exam   Constitutional: She is oriented to person, place, and time. She appears well-developed and well-nourished.   Eyes: Conjunctivae are normal.   Pulmonary/Chest: Effort normal.   Neurological: She is alert and oriented to  person, place, and time. Gait normal.   Psychiatric: She has a normal mood and affect. Her speech is normal and behavior is normal. Judgment normal. Cognition and memory are normal.            Supplemental Information:

## 2017-05-11 NOTE — Progress Notes (Signed)
Visit: Patient is here today to follow up about her high blood pressure    Refills? NO  Referral? NO  Letter or Form? NO  Lab Results? NO    HEALTH MAINTENANCE:  Has the patient has this done since their last visit?  Cervical screening/PAP: N/A  Mammo: N/A  Colon Screen: N/A  Diabetic Eye Exam (If applicable): N/A    Have you seen a specialist since your last visit: No    Vaccines Due? Yes, zoster    Does patient have eCare?  yes    HM Due:   Health Maintenance   Topic Date Due    Zoster Vaccine (2 of 3) 05/30/2012    Depression Screening (PHQ-2)  10/28/2017    Lipid Disorders Screening  08/05/2020    Tetanus Vaccine  04/16/2022    Osteoporosis Screening  Completed    Pneumococcal Vaccine  Completed    Influenza Vaccine  Completed       PCP Verified?  Yes, Volkman, Mathews Robinsons, MD    '

## 2017-05-11 NOTE — Patient Instructions (Signed)
It was a pleasure to see you in clinic today. Your Medical Assistant was: Kehinde Totzke, MA.          You can schedule an appointment to see us by calling  [phone] or via eCare.     If labs were ordered today the results are expected to be available via eCare 5 days later. Otherwise, result letters are mailed 7-10 days after your tests are completed. If your physician needs to change your care based on your results, you will receive a phone call to notify you. If you haven't heard from him/her and it has been more than 10 days please give us a call.     You may receive a survey in the mail asking how your experience has been with [facility].  Your input and opinions are very important to us.  I hope you are able to take the time to complete this survey.    Thank you for choosing [facility].

## 2017-05-11 NOTE — Progress Notes (Signed)
[facility] VESTIBULAR NOTE    GENERAL VISIT      Encounter Diagnosis   Name Primary?    Dizziness and giddiness Yes     Referring Provider: No ref. provider found    VISIT COUNT   4    G-CODE VISIT COUNT  1          Past Medical History:   Diagnosis Date    Allergic rhinitis due to allergen     Arthritis     Cancer ([place])     skin cell carcinoma     Constipation     Diverticulosis     Glaucoma     H. pylori infection     Headache     History of diarrhea     History of irritable bowel syndrome     Hypertension     Lipidemia     Osteoporosis     Primary insomnia        Social History     Social History Narrative    No narrative on file       SUBJECTIVE:   Here today for follow up and reports that she has been performing her exercises however has had a hard time progressing the her habituation exercises due to increased symptoms and nausea when rolling.  Reports increased symptoms when rolling to the left vs right. States that she has Has been monitoring her BP closely and is being medically managed.  BP today is 162/60 with HR 60.        OBJECTIVE / TREATMENT:  Neuromuscular re-education 55 minutes. Reassessed Hallpike per pt complaints with goggle on.  Negative Hallpike on the right and left with goggles on.  Reviewed habituation exercises, continues to report increased symptoms when rolling to the left.  Pt encouraged to progress HEP to 5 and from 5-10 after 5 days. States she attempt to progress if able.  Demonstrating good technique with x1 viewing exercises.          ASSESSMENT:   Increased symptoms and nausea reported with rolling.  BPPV negative BLY.  Encouraged to progress HEP as able.      PLAN:  Progress as able.    Willaim Sheng ANN

## 2017-05-13 ENCOUNTER — Ambulatory Visit (HOSPITAL_BASED_OUTPATIENT_CLINIC_OR_DEPARTMENT_OTHER): Payer: Medicare PPO

## 2017-05-15 ENCOUNTER — Other Ambulatory Visit (HOSPITAL_BASED_OUTPATIENT_CLINIC_OR_DEPARTMENT_OTHER): Payer: Medicare PPO

## 2017-05-16 ENCOUNTER — Encounter (HOSPITAL_BASED_OUTPATIENT_CLINIC_OR_DEPARTMENT_OTHER): Payer: Medicare PPO

## 2017-05-19 ENCOUNTER — Ambulatory Visit: Payer: Medicare PPO | Attending: Internal Medicine

## 2017-05-19 DIAGNOSIS — I10 Essential (primary) hypertension: Secondary | ICD-10-CM

## 2017-05-22 ENCOUNTER — Encounter (INDEPENDENT_AMBULATORY_CARE_PROVIDER_SITE_OTHER): Payer: Self-pay | Admitting: Internal Medicine

## 2017-05-24 ENCOUNTER — Telehealth (INDEPENDENT_AMBULATORY_CARE_PROVIDER_SITE_OTHER): Payer: Self-pay

## 2017-05-24 NOTE — Telephone Encounter (Signed)
Left a voicemail message that the appointment does not need authorization from her medical insurance. The patient will be liable for the device. If she loses or the amb bp monitor breaks, she will be liable for the cost of the monitor. appointment.

## 2017-05-24 NOTE — Telephone Encounter (Signed)
(  TEXTING IS AN OPTION FOR [facility] ONLY)  Is this a [place] clinic? No      RETURN CALL: Detailed message on voicemail only      SUBJECT:  General Message     REASON FOR REQUEST: Medical Supplies    MESSAGE: Patient called regarding her appointment on 05/26/17. Patient is inquiring if the Ambulatory BP Monitor is covered by her insurance.

## 2017-05-26 ENCOUNTER — Ambulatory Visit: Payer: Medicare PPO | Attending: Internal Medicine

## 2017-05-26 ENCOUNTER — Ambulatory Visit (HOSPITAL_BASED_OUTPATIENT_CLINIC_OR_DEPARTMENT_OTHER): Payer: Medicare PPO

## 2017-05-26 NOTE — Progress Notes (Signed)
Referring MD: Edsel Petrin    Test: blood pressure monitor     Reason: Labile blood pressure    Duration: 24 hrs     Start time: 11 am

## 2017-06-01 ENCOUNTER — Encounter (INDEPENDENT_AMBULATORY_CARE_PROVIDER_SITE_OTHER): Payer: Self-pay | Admitting: Internal Medicine

## 2017-06-01 ENCOUNTER — Ambulatory Visit (HOSPITAL_BASED_OUTPATIENT_CLINIC_OR_DEPARTMENT_OTHER): Payer: Medicare PPO

## 2017-06-01 ENCOUNTER — Ambulatory Visit (INDEPENDENT_AMBULATORY_CARE_PROVIDER_SITE_OTHER): Payer: Medicare PPO | Admitting: Internal Medicine

## 2017-06-01 VITALS — BP 147/79 | HR 65 | Temp 97.8°F | Resp 16

## 2017-06-01 DIAGNOSIS — G43109 Migraine with aura, not intractable, without status migrainosus: Secondary | ICD-10-CM

## 2017-06-01 DIAGNOSIS — R42 Dizziness and giddiness: Secondary | ICD-10-CM

## 2017-06-01 DIAGNOSIS — I10 Essential (primary) hypertension: Secondary | ICD-10-CM

## 2017-06-01 MED ORDER — AMLODIPINE BESYLATE 5 MG OR TABS
5.0000 mg | ORAL_TABLET | Freq: Every day | ORAL | 0 refills | Status: DC
Start: 2017-06-01 — End: 2017-07-07

## 2017-06-01 NOTE — Progress Notes (Signed)
[facility] VESTIBULAR NOTE    GENERAL VISIT      No diagnosis found.  Referring Provider: No ref. provider found    VISIT COUNT   5    G-CODE VISIT COUNT  1          Past Medical History:   Diagnosis Date    Allergic rhinitis due to allergen     Arthritis     Cancer ([place])     skin cell carcinoma     Constipation     Diverticulosis     Glaucoma     H. pylori infection     Headache     History of diarrhea     History of irritable bowel syndrome     Hypertension     Lipidemia     Osteoporosis     Primary insomnia        Social History     Social History Narrative    Not on file         SUBJECTIVE:   Here today for follow up.  Reports that she has not been able to perform her exercises due to increased dizziness and nausea associated with exercise recommendations.  States symptoms last all day and prevent her from doing anything else.  Will be following up with her primary MD today to address her ongoing concerns of dizziness and to review her BP readings.  Pt states she was hooked up to a bp monitior x 2 hrs. And will be reviewing the results today with her physician.  Reports she is frustrated as she it tired of feeling so bad.      OBJECTIVE / TREATMENT:  Neuromuscular re-education 10 minutes. Recommended d/c vestibular therapy exercises and future apts at this time until pt has followed up with her primary MD as exercises do not appear to be helping.         ASSESSMENT:   Pt and therapist to discontinue services today until further notice.         PLAN:  D/c    Willaim Sheng ANN

## 2017-06-01 NOTE — Progress Notes (Signed)
Visit: Patient is here today to follow up about blood pressure.  Patient also reports dizziness/vertigo    Refills? NO  Referral? NO  Letter or Form? NO  Lab Results? NO    HEALTH MAINTENANCE:147/79  Has the patient has this done since their last visit?  Cervical screening/PAP: N/A  Mammo: N/A  Colon Screen: N/A  Diabetic Eye Exam (If applicable): N/A    Have you seen a specialist since your last visit: No    Vaccines Due? Yes, zoster    Does patient have eCare?  yes    HM Due:   Health Maintenance   Topic Date Due    Zoster Vaccine (2 of 3) 05/30/2012    Depression Screening (PHQ-2)  10/28/2017    Lipid Disorders Screening  08/05/2020    Tetanus Vaccine  04/16/2022    Osteoporosis Screening  Completed    Pneumococcal Vaccine  Completed    Influenza Vaccine  Completed       PCP Verified?  Yes, Volkman, Mathews Robinsons, MD

## 2017-06-01 NOTE — Progress Notes (Signed)
Robin Hart is a 77 year old female who presents today for hypertension.      Assessment and Plan:    1. Essential hypertension  We reviewed her ambulatory log with her and I printed out a copy.  We will further intensify her regimen with amlodipine.  She can follow up with [place] over Ecare as to whether her blood pressures are at goal.  - amLODIPine 5 MG Oral Tab; Take 1 tablet (5 mg) by mouth daily.  Dispense: 90 tablet; Refill: 0    2. Vertigo  I am at a loss as to what next to offer her. I appreciate how negative this symptom has impacted her life. She has had an ENT evaluation and MRI and neurologic evaluation for vestibular therapy.  We will send her for a second opinion from ear nose and throat. Have also encouraged her to schedule her 3 mos f/u with neuro  - REFERRAL TO OTO-HEAD NECK SURGERY    3. Migraine with aura and without status migrainosus, not intractable  There is prior suspicion that this could've been a migraine but supportive medications haven't helped.  She still has some prochlorperazine at home and I've encouraged her to try using this at least to treat the secondary symptom of nausea until she has additional evaluation.      ---    Follow up: 2 months        Subjective:     History of Present Illness:       Ambulatory monitor confirmed systolic hypertension. Avg 604 systolic. Max 180.   She is agreeable that we should intensify her therapy.    She was just at the OT again. Still dealing with the nausea, vertigo, headache. Can't go to the gym at all. Can't do the OT exercises. It is the Dietitian of her quality of life.  She is really frustrated with the vertigo.  She wants to know what we can do about it.  She feels like there is/in her ears and that it is positional and that there must be something wrong with her years was not found in her initial ENT evaluation.  Lots of other beneficial effects from the TCA, but hasn't fixed the vertigo.     Review of Systems   Constitution:  Negative.   Psychiatric/Behavioral: Negative.          Objective:     PE:    Vitals:    06/01/17 1630   BP: (!) 147/79   BP Cuff Size: Regular   BP Site: Left Arm   BP Position: Sitting   Pulse: 65   Resp: 16   Temp: 97.8 F (36.6 C)   TempSrc: Temporal   SpO2: 99%     There is no height or weight on file to calculate BMI.    Physical Exam   Constitutional: She is oriented to person, place, and time. She appears well-developed and well-nourished.   Eyes: Conjunctivae are normal.   Pulmonary/Chest: Effort normal.   Neurological: She is alert and oriented to person, place, and time. She displays no tremor. Gait normal.   Psychiatric: She has a normal mood and affect. Her behavior is normal.            Supplemental Information:

## 2017-06-01 NOTE — Patient Instructions (Addendum)
CALL FOR AN ENT APPOINTMENT    CALL FOR A NEURO FOLLOW UP    USE YOUR ANTIEMETICS    ADDED AMLODIPINE FOR BLOOD PRESSURE    It was a pleasure to see you in clinic today. Your Medical Assistant was: Psychiatrist, [place].          You can schedule an appointment to see [place] by calling  [phone] or via eCare.     If labs were ordered today the results are expected to be available via eCare 5 days later. Otherwise, result letters are mailed 7-10 days after your tests are completed. If your physician needs to change your care based on your results, you will receive a phone call to notify you. If you haven't heard from him/her and it has been more than 10 days please give [place] a call.     You may receive a survey in the mail asking how your experience has been with [facility]  Your input and opinions are very important to [place].  I hope you are able to take the time to complete this survey.    Thank you for choosing [facility].

## 2017-06-02 ENCOUNTER — Encounter (INDEPENDENT_AMBULATORY_CARE_PROVIDER_SITE_OTHER): Payer: Self-pay | Admitting: Internal Medicine

## 2017-06-02 DIAGNOSIS — I10 Essential (primary) hypertension: Secondary | ICD-10-CM

## 2017-06-02 DIAGNOSIS — R42 Dizziness and giddiness: Secondary | ICD-10-CM

## 2017-06-02 DIAGNOSIS — R110 Nausea: Secondary | ICD-10-CM

## 2017-06-02 NOTE — Telephone Encounter (Signed)
Referral Team: please fax requested information to subspecialty office. Thank you.   See ecare message: hearing, VNG, ECOG, ABR, MRA, and brain MRI. Also pls include ENT and neurology consult notes and my note from 02/01/17.

## 2017-06-02 NOTE — Telephone Encounter (Signed)
Please see eCare message and write referral for patient

## 2017-06-05 ENCOUNTER — Telehealth (INDEPENDENT_AMBULATORY_CARE_PROVIDER_SITE_OTHER): Payer: Self-pay | Admitting: Otolaryngology

## 2017-06-05 NOTE — Telephone Encounter (Signed)
Referral and requested notes faxed to below location (108 pages total).  Patient notified via Robin Hart    Dr. Willaim Bane  [facility]   [address] , [place] [address]   [phone])   [phone] (F)

## 2017-06-05 NOTE — Telephone Encounter (Signed)
Robin Hart called in to state that she is having ear pain. I advised to go to urgent care and then follow up with [place] since we are fully booked. She wanted to check if there is something that Dr. Augustin Coupe can prescribe by phone.

## 2017-06-05 NOTE — Telephone Encounter (Signed)
Spoke to patient regarding ear pain. Robin Hart states she feels more like its an ear fluid issue that is causing pain. Patient doesn't think a prescription over the phone is going to work. I agreed and offered an appointment for 06/06/17 at 8:30 am. Patient confirmed appointment. Patient voices happiness at an appointment, understanding and acceptance of this advice and will call back if any further questions or concerns.

## 2017-06-06 ENCOUNTER — Encounter (INDEPENDENT_AMBULATORY_CARE_PROVIDER_SITE_OTHER): Payer: Self-pay | Admitting: Otolaryngology

## 2017-06-06 ENCOUNTER — Ambulatory Visit (INDEPENDENT_AMBULATORY_CARE_PROVIDER_SITE_OTHER): Payer: Medicare PPO | Admitting: Otolaryngology

## 2017-06-06 VITALS — BP 126/60 | HR 70 | Resp 16

## 2017-06-06 DIAGNOSIS — H6063 Unspecified chronic otitis externa, bilateral: Secondary | ICD-10-CM

## 2017-06-06 DIAGNOSIS — H6123 Impacted cerumen, bilateral: Secondary | ICD-10-CM

## 2017-06-06 MED ORDER — FLUOCINOLONE ACETONIDE 0.01 % OT OIL
4.0000 [drp] | TOPICAL_OIL | Freq: Two times a day (BID) | OTIC | 1 refills | Status: DC | PRN
Start: 2017-06-06 — End: 2017-07-07

## 2017-06-06 NOTE — Progress Notes (Signed)
Office Visit      PATIENT:  Robin Hart  DOB:  May 10, 1940  MRN:  [id number]      CHIEF COMPLAINT/HPI:  Patient is a 77 year old female who presented with dizziness for 5 months.   Symptoms are   moderate, intermittent, and unchanged with vestibular therapy.  Her [facility] neurologist   diagnosed migraine associated dizziness.  Symptoms did not improve with 2 month trial of   nortriptyline.  She has an appointment scheduled with neuro-otologist Dr. Parke Simmers in May.    She is scheduling an appointment with neuro-otologist Dr. Verdene Lennert.  It is described as a   sensation of motion sickness and tilting.  Symptoms occurred after using a balance ball   during physical therapy for her back.  Symptoms are aggravated by rapid movement and   turning her head to the right.  She believes that hypertension makes symptoms worse.    Symptoms can last hours.  Relieving factors include sleeping and eating.  She continues   to use Q-tips.  Associated symptoms include nausea, itchy ears, otalgia, and ear pressure.    She has migraines in the past but not currently.  She denies otorrhea, tinnitus, and hearing   loss.  No recent trauma or URI.                  Prior testing:   Audiogram 02/14/17: WNL, tymp A AU, SDS 100% AU  VNG:  WNL  ECOG:  AD 0.23, AS 0.10  (WNL = <.45)  ABR: WNL  MRI brain 02/27/17:  Foci of apparent microhemorrhage in the pons and thalami bilaterally with   pattern most consistent with hypertensive disease.  MRA head/neck 02/27/17:  WNL                  HEARING AID USE:  None      PMHX:  Past Medical History:   Diagnosis Date    Allergic rhinitis due to allergen     Arthritis     Cancer (HCC)     skin cell carcinoma     Constipation     Diverticulosis     Glaucoma     H. pylori infection     Headache     History of diarrhea     History of irritable bowel syndrome     Hypertension     Lipidemia     Osteoporosis     Primary insomnia      PSHX:  Past Surgical History:   Procedure Laterality Date     Basal cell carcinoma excision      BLADDER SUSPENSION      BREAST BIOPSY      COLONOSCOPY STOMA DX INCLUDING COLLJ SPEC SPX      TOTAL ABDOMINAL HYSTERECT W/WO RMVL TUBE OVARY       FHX: @HXFAMILY @  SHX:  Social History     Socioeconomic History    Marital status: Not on file     Spouse name: Not on file    Number of children: Not on file    Years of education: Not on file    Highest education level: Not on file   Social Needs    Financial resource strain: Not on file    Food insecurity - worry: Not on file    Food insecurity - inability: Not on file    Transportation needs - medical: Not on file    Transportation needs - non-medical: Not on file  Occupational History    Not on file   Tobacco Use    Smoking status: Never Smoker    Smokeless tobacco: Never Used   Substance and Sexual Activity    Alcohol use: Yes     Comment: occasionally    Drug use: No    Sexual activity: Not Currently   Other Topics Concern    Not on file   Social History Narrative    Not on file       ALLERGIES:  has No Known Allergies.    MEDICATIONS:  has a current medication list which includes the following prescription(s): amlodipine, fluocinolone acetonide, hydrochlorothiazide, latanoprostene bunod, losartan potassium, and timolol maleate.       REVIEW OF SYSTEMS:    Constitutional: negative  Eye: negative  ENT:  + dizziness, vertigo, ear pressure, itchy ears, otalgia, excessive cerumen.  Denies hearing loss, infections,  tinnitus, noise exposure, Nose/Sinus: anosmia/hyposmia, nasal drainage, epistaxis, facial pain, nasal congestion, nasal obstruction, rhinorrhea, sinusitis, sneezing, Throat: taste change, voice change, hoarseness, dysphagia, lump in throat, odynophagia, post-nasal drainage, sore tongue, pharyngitis, snoring, tooth pain   Gastrointestinal:  + nausea  Neurological: + dizziness, vertigo, h/o migraines      PHYSICAL EXAMINATION:  Vitals:    06/06/17 0833   BP: 126/60   BP Cuff Size: Regular   BP Site: Left  Arm   BP Position: Sitting   Pulse: 70   Resp: 16   SpO2: 99%        General: Well developed, appearing stated age and in no acute distress. Participated in the patient interview appropriately.  Eyes: Clear conjunctive, normal lids. Pupils equal, round and reactive to light.  Extraocular movements intact.  Ears: visualized by microscopy.               Right: Pinna unremarkable.  External ear canal without masses or skin abnormality. + cerumen impaction. Tympanic membrane intact and without retraction or perforation. Middle ear without fluid.  No hearing loss.                      Left: Pinna unremarkable.  External ear canal without masses or skin abnormality. + cerumen impaction. Tympanic membrane intact and without retraction or perforation. Middle ear without fluid. No hearing loss.      Neuro:  CN II-XII grossly intact (VII Movements of facial expression bilaterally intact and symmetrical), Negative Dix-Hallpike.        Ear impacted Cerumen Removal Procedure Note:  Consent obtained. Procedure discussed. Questions answered. Correct patient identified.    Right:  Microscope used.  Cerumen impaction completely removed.  Left:  Microscope used.  Cerumen impaction completely removed.    Removal method:  Suction    Patient tolerated procedure well.        IMPRESSION/PLAN:  Chronic otitis externa, Impacted cerumen   Prescribe Fluocinolone oil for itchy ears  Routine cerumen removal  F/u neuro-otologist for dizziness  - pt has appt with Dr. Verdene Lennert and Dr. Kerin Salen, MD      Cc:   Rachel Moulds Mathews Robinsons, [place], MD  [address] [place] [address]

## 2017-06-06 NOTE — Patient Instructions (Signed)
Patient Education     Anatomy of the Ear    The ear is a complex and delicate organ. It collects sound waves so you can hear the world around you. The ear also has a second function--it helps you keep your balance. Your ear can be divided into 3 parts. The outer ear and middle ear help collect and amplify sound. The inner ear converts sound waves to messages that are sent to the brain. The inner ear also senses the movement and position of your head and body so you can maintain your balance and see clearly, even when you change positions.  The mastoid bone surrounds the middle ear. The external ear collects sound waves. The ear canal carries sound waves to the eardrum. The eardrum vibrates from sound waves, setting the middle ear bones in motion. The middle ear bones (ossicles) vibrate, transmitting sound waves to the inner ear. When the ear is healthy, air pressure remains balanced in the middle ear. The eustachian tube helps control air pressure in the middle ear.The semicircular canals help maintain balance. The vestibular nerve carries balance signals to the brain. The auditory nerve carries sound signals to the brain. The cochlea picks up sound waves and makes nerve signals.    Date Last Reviewed: 01/03/2015   2000-2017 [employer]. [address], [place] [address]. All rights reserved. This information is not intended as a substitute for professional medical care. Always follow your healthcare professional's instructions.        Itchy ears are a very common problem. For some people the problem is so bad that they stick various objects into the ears, causing trauma to the ear canal. The most common causes of itching are a nervous habit, fungal infection or the beginning of an infection. Other causes can be skin diseases such as psoriasis or dermatitis. Some people with allergies complain of Itchy ears.    The ear canal may be normal on examination or there may be scaling of the skin.  People aggravate the problem by using things such as bobby-pins, coat hangers and tooth picks to scratch the ear. This can produce abrasions of the ear canal.  Any break in the skin can allow bacteria to enter through this protective barrier. The ear will then become infected requiring it to be treated the same as swimmer's ear.    Itching by itself without evidence of trauma or infection can be treated with a mild steroid ear drop.  A few drops placed in the ear will help to decrease the amount of itching. Another helpful treatment is the use of 70% alcohol (rubbing alcohol) as an ear drop. CAUTION: itchy ears may be the first signal that an infection is developing. If the ear is infected the alcohol will burn.

## 2017-06-12 ENCOUNTER — Ambulatory Visit: Payer: Medicare PPO | Attending: Internal Medicine

## 2017-06-12 DIAGNOSIS — Z1231 Encounter for screening mammogram for malignant neoplasm of breast: Secondary | ICD-10-CM | POA: Insufficient documentation

## 2017-06-12 DIAGNOSIS — Z9889 Other specified postprocedural states: Secondary | ICD-10-CM | POA: Insufficient documentation

## 2017-06-19 ENCOUNTER — Ambulatory Visit (INDEPENDENT_AMBULATORY_CARE_PROVIDER_SITE_OTHER): Payer: Medicare PPO | Admitting: Orthopaedic Surgery

## 2017-06-19 ENCOUNTER — Encounter (INDEPENDENT_AMBULATORY_CARE_PROVIDER_SITE_OTHER): Payer: Self-pay | Admitting: Orthopaedic Surgery

## 2017-06-19 VITALS — BP 120/70 | HR 68

## 2017-06-19 DIAGNOSIS — M48061 Spinal stenosis, lumbar region without neurogenic claudication: Secondary | ICD-10-CM

## 2017-06-19 DIAGNOSIS — G8929 Other chronic pain: Secondary | ICD-10-CM

## 2017-06-19 DIAGNOSIS — M25511 Pain in right shoulder: Secondary | ICD-10-CM

## 2017-06-19 DIAGNOSIS — M5417 Radiculopathy, lumbosacral region: Secondary | ICD-10-CM

## 2017-06-19 DIAGNOSIS — M9983 Other biomechanical lesions of lumbar region: Secondary | ICD-10-CM

## 2017-06-19 DIAGNOSIS — M25512 Pain in left shoulder: Secondary | ICD-10-CM

## 2017-06-19 DIAGNOSIS — M47817 Spondylosis without myelopathy or radiculopathy, lumbosacral region: Secondary | ICD-10-CM

## 2017-06-19 DIAGNOSIS — M9907 Segmental and somatic dysfunction of upper extremity: Secondary | ICD-10-CM

## 2017-06-19 DIAGNOSIS — M4310 Spondylolisthesis, site unspecified: Secondary | ICD-10-CM

## 2017-06-19 NOTE — Progress Notes (Signed)
Robin Hart presents today for evaluation ofbilateral low back and right hip pain radiates to calf and foot    Reports: started PHYSICAL THERAPY at [facility]. Her facet/low back pain is improved but her right hip pain that radiates to her right foot is not changed. Comes and goes.     Would like a Physical Therapy referral for her shoulder, R>L. No imaging.     Right hip that radiates to her right foot.   Alleviated with resting in bed 0/10   fluctuating from a 6-9/10.     Last seen: 04/21/17  Treatments have included:    Medications: aleve prn   Injections: none   Therapies:pastPHYSICAL THERAPY, she does some exerciess at home,   Surgery: none  Diagnostic studies (such as EMG and imaging) include:   XR L Spine 09/22/16  MRI L Spine 11/15/16

## 2017-06-19 NOTE — Progress Notes (Signed)
06/19/2017    Burr Medico  [id number]    IDENTIFYING DATA:  Robin Hart is a pleasant 77 year old female who was last seen in clinic on 04/21/2017.  She presents today for reevaluation.     INTERIM HISTORY:  Robin Hart is here to follow-up regarding bilateral low back and also right hip and leg pain with some referral down towards the calf and foot.  She is overall pleased with her therapist at [facility].  They worked on lumbar facet-based pain as she is improving area still has some referral distally however which is intermittent.  Does feel better resting and sitting down, but pain can get up to 6-9/10 including referral.  Denies any gross new weakness or numbness but she does notice that she is clearly weaker on the right leg with standing and other exercises compared to left.  Also does a some shoulder ache and tightness she feels is more muscular.  Does wonder about working with therapy for this as well.  Requesting referral.    PMH/PSH/MEDICATIONS/FAMILY MEDICAL HISTORY/SOCIAL HISTORY:  See chart. Pertient changes described above.     MEDICATIONS/ALLERGIES:  See chart. No changes.     REVIEW OF SYSTEMS:  14 point review of systems negative other than mentioned above.    PHYSICAL EXAMINATION:  General: Awake, alert, and oriented, no apparent distress, pleasant, and cooperative  Vitals: Blood pressure 120/70, pulse 68.There is no height or weight on file to calculate BMI.  Neurologic/Musculoskeletal: Gait and movements are slightly antalgic.  She does a some ache today with lumbar extension reproducing locally symptoms, questionable referral distally.  Does have relief with forward flexion.  Neural tension signs today are equivocal.  Does a some hip girdle weakness on the right compared to left side otherwise no new specific sensory deficits to light touch testing.  Brief evaluation shoulders is with good range of motion, no impingement symptoms, no weakness numbness in upper extremity's.  She  does however have Atkinson shoulder girdle muscles and also discomfort and upper Thoracics.  Spurling's negative.    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 We discussed her symptoms and also response to work with physical therapy which has been beneficial.  She feels that she is making slow but steady progress and was a continue this course for now as she is not having any setbacks and clearly improving.  She would also like to have her referral specifically to work on her shoulders and this was given today.  2. With regards to her leg symptoms this is clearly consistent with irritation of the L4 and L5 nerve roots.  The next step if she has persistent irritation distally or if there is a lack of progress due to her sac symptoms targeting these would be the next step.  She understood but wants to hold off for now  3. Comfortable plan and discussion.  4. Follow-up with me in 2 months or sooner if needed.  Call with any questions          Robin Hart Tomasa Rand, D.O.   Board certified - [place]  Portions of this document were created with voice-recognition software and may contain occasional errant or misspelled words.

## 2017-07-07 ENCOUNTER — Ambulatory Visit (INDEPENDENT_AMBULATORY_CARE_PROVIDER_SITE_OTHER): Payer: Medicare PPO | Admitting: Internal Medicine

## 2017-07-07 ENCOUNTER — Encounter (INDEPENDENT_AMBULATORY_CARE_PROVIDER_SITE_OTHER): Payer: Self-pay | Admitting: Internal Medicine

## 2017-07-07 VITALS — BP 119/65 | HR 57 | Temp 98.0°F | Resp 17 | Wt 120.0 lb

## 2017-07-07 DIAGNOSIS — R42 Dizziness and giddiness: Secondary | ICD-10-CM

## 2017-07-07 DIAGNOSIS — Z6823 Body mass index (BMI) 23.0-23.9, adult: Secondary | ICD-10-CM

## 2017-07-07 DIAGNOSIS — I10 Essential (primary) hypertension: Secondary | ICD-10-CM

## 2017-07-07 MED ORDER — AMLODIPINE BESYLATE 2.5 MG OR TABS
2.5000 mg | ORAL_TABLET | Freq: Every day | ORAL | 0 refills | Status: DC
Start: 2017-07-07 — End: 2017-12-27

## 2017-07-07 NOTE — Progress Notes (Signed)
Robin Hart is a 77 year old female who presents today for HTN and dizziness.       Assessment and Plan:    1. Essential hypertension  Her blood pressures maybe a little bit too tightly controlled now.  I have asked her to cut back on the amlodipine.  The lightheadedness with exercise it may be related to relative bradycardia.  She's not on any oral beta blockers so the atenolol could be playing a role here obviously she needs that for the glaucoma.  We'll continue to keep an eye on it.  - amLODIPine 2.5 MG Oral Tab; Take 1 tablet (2.5 mg) by mouth daily.  Dispense: 90 tablet; Refill: 0    2. Vertigo  This remains unexplained.  She was wondering if she is too many appointments scheduled but I think since this has been going on for 6 months it is worth exploring multiple opinions.  We will look for the results of her upcoming ENT and neuro evaluation.  We will leave follow-up open-ended at this point.      ---        Subjective:     History of Present Illness:       Her blood pressures have been much better controlled.  At home they're arranging from 90s over 50s up to 130s over 60s.  She does get a little lightheaded and winded if she tries to go up stairs when her blood pressure is low.  Her heart rates are in the 50s to 60s.    She continues to have vertigo.  The severity of it waxes and wanes.  She can check things with her eyes but she finds that if she lays down and rolls around basically doing exercises they taught her in physical therapy that reliably provokes a severe episode of vertigo.  She wonders if she should be doing this before her vestibular testing or not.  She saw Dr. Augustin Coupe for cerumen impaction and this really improved itchiness in her ears but that also didn't improve her dizziness at all.    She's an appointment on Monday for vestibular testing and to see Dr. Verdene Lennert.  She also ended up scheduling with another neuro-otologist in May.  She has a stroke clinic appointment in mid  April.    She merits to spending a lot of time also trying to think about the reason why she is so vertiginous and being bothered by it.  She hasn't noted any other new neurologic symptoms or deficits.     Review of Systems   Eyes:        She's had a lot of dry eye and worsening blepharitis she also has an eye appointment coming up   Cardiovascular: Positive for dyspnea on exertion.   Neurological: Negative.          Objective:     PE:    Vitals:    07/07/17 1503 07/07/17 1504   BP:  119/65   BP Cuff Size: Regular Regular   BP Site: Left Arm Left Arm   BP Position: Sitting Sitting   Pulse:  57   Resp:  17   Temp:  98 F (36.7 C)   TempSrc: Temporal Esophageal   SpO2:  100%   Weight:  120 lb (54.4 kg)     Body mass index is 23.44 kg/m.    Physical Exam   Constitutional: She is oriented to person, place, and time. She appears well-developed and well-nourished.  She looks tired   Eyes: Conjunctivae are normal.   Pulmonary/Chest: Effort normal.   Neurological: She is alert and oriented to person, place, and time.   Psychiatric: She has a normal mood and affect. Her behavior is normal. Judgment normal.      Supplemental Information:

## 2017-07-07 NOTE — Patient Instructions (Signed)
It was a pleasure to see you in clinic today. Your Medical Assistant was: Carolyn Sylvia, MA.          You can schedule an appointment to see us by calling  [phone] or via eCare.     If labs were ordered today the results are expected to be available via eCare 5 days later. Otherwise, result letters are mailed 7-10 days after your tests are completed. If your physician needs to change your care based on your results, you will receive a phone call to notify you. If you haven't heard from him/her and it has been more than 10 days please give us a call.     You may receive a survey in the mail asking how your experience has been with [facility].  Your input and opinions are very important to us.  I hope you are able to take the time to complete this survey.    Thank you for choosing [facility].

## 2017-07-07 NOTE — Progress Notes (Signed)
Visit:   Chief Complaint   Patient presents with    Blood Pressure     Patient is here today to follow up about blood pressure and dizzy spells.  States she has had some improvement         Refills? NO  Referral? NO  Letter or Form? NO  Lab Results? NO    HEALTH MAINTENANCE:  Has the patient has this done since their last visit?  Cervical screening/PAP: N/A  Mammo: N/A  Colon Screen: N/A  Diabetic Eye Exam (If applicable): N/A    Have you seen a specialist since your last visit: No    Vaccines Due? Yes, zoster    Does patient have eCare?  yes    HM Due:   Health Maintenance   Topic Date Due    Zoster Vaccine (2 of 3) 05/30/2012    Depression Screening (PHQ-2)  10/28/2017    Lipid Disorders Screening  08/05/2020    Tetanus Vaccine  04/16/2022    Osteoporosis Screening  Completed    Pneumococcal Vaccine  Completed    Influenza Vaccine  Completed       PCP Verified?  Yes, Volkman, Mathews Robinsons, MD

## 2017-07-10 ENCOUNTER — Encounter (INDEPENDENT_AMBULATORY_CARE_PROVIDER_SITE_OTHER): Payer: Self-pay | Admitting: Otolaryngology

## 2017-07-10 ENCOUNTER — Ambulatory Visit (INDEPENDENT_AMBULATORY_CARE_PROVIDER_SITE_OTHER): Payer: Medicare PPO | Admitting: Audiologist

## 2017-07-10 ENCOUNTER — Ambulatory Visit (INDEPENDENT_AMBULATORY_CARE_PROVIDER_SITE_OTHER): Payer: Medicare PPO | Admitting: Otolaryngology

## 2017-07-10 VITALS — BP 108/60 | HR 56 | Ht 61.0 in | Wt 120.0 lb

## 2017-07-10 DIAGNOSIS — R42 Dizziness and giddiness: Secondary | ICD-10-CM

## 2017-07-10 DIAGNOSIS — Z6822 Body mass index (BMI) 22.0-22.9, adult: Secondary | ICD-10-CM

## 2017-07-10 NOTE — Progress Notes (Signed)
Robin Hart is a 77 year old female who returns to be seen today for  Dizziness (Sudden on set vertigo in October 2018, no known trigger/cause. Has seen Roni Bread but this had to be paused for HTN management. Symptoms are exacerbated with quick head turns and activity.). She has seen a neurologist who diagnosed migraine induced dizziness and started her on a tricyclic without benefit.  She has had a VNG and ABR both normal. No hearing loss.  Her blood pressure is now under control.    Review of her MRI from 02/2017 (noncontrast) T2 weighted images no IAC/CPA tumors    Review of systems: HTN, Migraine         Review of patient's allergies indicates:  No Known Allergies        My findings of today's test as follows:  Audiogram revealed within normal limits  Tymp (reflex): within normal limits      Radiology:      Physical Exam:  BP 108/60    Pulse 56    Ht 5\' 1"  (1.549 m)    Wt 120 lb (54.4 kg)    SpO2 96%    BMI 22.67 kg/m    Ears   Pinna: No abnormalities   Canals: No abnormalities    [place]       Face Symmetry: No abnormalities    Ocular Mobility: No abnormalities  Otoscopic: TM's intact and mobile    Neuro:   Mental status: Alert and oriented x3   CN II-XII: No abnormalities   Romberg: No abnormalities   Fukuda: No abnormalities   Cerebellar: No abnormalities   Gait: Normal    PROCEDURE: No procedure performed    ASSESSMENT:  1. Dizziness and giddiness      PLAN:   Discussed use of Mg2+ for migraine induced dizziness and f/u with Neurology for other options.  No evidence of inner ear dysfunction for cause of dizziness. I spent a total time of 25 minutes face-to-face with the patient, of which more than 50% was spent counseling as outlined in this note.    Ray Church, MD

## 2017-07-10 NOTE — Progress Notes (Signed)
Robin Hart is a 57 yrs female who was seen for a hearing evaluation.     Iver continues to have ongoing dizziness and would like Dr. Fransisco Hertz opinion    OBJECTIVE/ASSESSMENT    Otoscopic Inspection:     Right Ear: Clear EAC, visualized TM    Left Ear: Clear EAC, visualized TM    Tympanometry: See Scanned results in EPIC media tab    Right Ear: Normal peak pressure and compliance    Left Ear: Normal peak pressure and compliance    Audiometry: See Scanned results in EPIC media tab    Right Ear: Hearing is stable when compared to prior audiogram. Within normal limits [id number] Hz    Left Ear: Hearing is stable when compared to prior audiogram. Within normal limits [id number] Hz    Speech Discrimination (Word Recognition): See Scanned results in EPIC media tab    Right Ear:  100% words correct at 60 dBHL.    Left Ear:  100% words correct at  55 dBHL    PLAN      ICD-10-CM ICD-9-CM    1. Disequilibrium R42 780.4 COMPRE AUDIOMETRY THRESHOLD EVAL SP RECOGNIJ      TYMPANOMETRY       Patient to follow up with Dr. Haskel Khan.     Kizzie Furnish, AUD

## 2017-07-18 ENCOUNTER — Ambulatory Visit (HOSPITAL_BASED_OUTPATIENT_CLINIC_OR_DEPARTMENT_OTHER): Payer: Medicare PPO | Attending: Neurology | Admitting: Neurology

## 2017-07-18 ENCOUNTER — Encounter (HOSPITAL_BASED_OUTPATIENT_CLINIC_OR_DEPARTMENT_OTHER): Payer: Self-pay | Admitting: Neurology

## 2017-07-18 VITALS — BP 137/48 | HR 59 | Temp 97.2°F | Ht 60.98 in | Wt 118.0 lb

## 2017-07-18 DIAGNOSIS — Z6822 Body mass index (BMI) 22.0-22.9, adult: Secondary | ICD-10-CM

## 2017-07-18 DIAGNOSIS — R42 Dizziness and giddiness: Secondary | ICD-10-CM | POA: Insufficient documentation

## 2017-07-18 DIAGNOSIS — G43809 Other migraine, not intractable, without status migrainosus: Secondary | ICD-10-CM | POA: Insufficient documentation

## 2017-07-18 NOTE — Patient Instructions (Signed)
Robin Hart, you were seen in the [facility] stroke clinic for episodic vertigo with severe nausea triggered by position changes. I recommend continuing magnesium trial for at least another 2 weeks. I have referred you to Dr. Ermalinda Barrios, an autonomic specialist, and a tilt table test to further assess episodes.     I would like to see you in 8-9 months, after you have seen Dr. Ermalinda Barrios. Please call or message with any concerns.     Take Care,   [name]    Patient Education     Vertigo (Unknown Cause)    In addition to helping with hearing, the inner ear is part of the balance center of your body. Problems with the inner ear can a false feeling of motion.This is called vertigo. Often, it feels as if you or the room is spinning. A vertigo attack may cause sudden nausea, vomiting and heavy sweating. Severe vertigo causes a loss of balance and can cause you to fall. During vertigo, small head movements and changes in body position will often make the symptoms worse. You may also have ringing in the ears called tinnitus.  An episode of vertigo may last seconds, minutes or hours. Once you are over the first episode, it may never come back. However, symptoms may return off and on.  The cause of your vertigo is not yet known.Possible causes of vertigo include:   Inflammation of the inner ear   Disease of the nerves to the inner ear   Movement of calcium particles in the inner ear   Poor blood flow to the balance centers of the brain   Migraine headaches  Home care   If symptoms are severe, rest quietly in bed. Change positions very slowly. There is usually one position that will feel best, such as lying on one side or lying on your back with your head slightly raised on pillows.   Do not drivea caror work with dangerous machinery until symptoms have been gone for at least one week.   Take medicine as prescribed to relieve your symptoms. Unless another medicine was prescribed for symptoms of nausea, vomiting, and  dizziness, you may use over-the-counter motion sickness pills.Ask your pharmacist for suggestions.  Follow-up care  Follow up with your healthcare provider or as directed. If you are referred to a specialist or for testing, make the appointment promptly.  When to seek medical advice  Call your healthcare provider if any of the following occur:   Fever of 100.59F (38C) or higher, or as directed by your healthcare provider   Vertigo worsens or is not controlled byprescribedmedicine   Repeated vomiting not relieved by prescribedmedicine   Severe headache   Confusion   Weakness of an arm or leg or one side of the face   Difficulty with speech or vision   Loss of consciousness   Seizure  Date Last Reviewed: 11/17/2013   2000-2017 [employer]. [address], [place] [address]. All rights reserved. This information is not intended as a substitute for professional medical care. Always follow your healthcare professional's instructions.

## 2017-07-18 NOTE — Progress Notes (Signed)
[facility] STROKE CLINIC VISIT  ______________________________________________    HPI:  Robin Hart is a 77 year old R hand dominant female here for follow up for intermittent vertigo.     She was last seen in the [facility] stroke clinic 03/17/2017. At which time it was felt the vertigo symptoms could be related to migraine. In reviewing chart, she had no improvement to nortriptyline trial and medication was discontinued.     Briefly, symptoms started ~01/2017. While at PT, doing exercises on medicine ball and upon standing had first episode. Feels like she's moving within her head. Intermittent. Typically gets dizzy, gets worse, gets nauseated, followed by headache. Blood pressure tends to be elevated during these episodes.Typically lasts for hours up to a day. Headache is frontal, pressure like. Pain ranges from 5-10/10 in severity. Vision is not clearly affected during episode further than her baseline issues with glaucoma and blurry vision related to dry contacts. Denies diplopia. No focal neurologic symptoms during these. Feels like the symptoms are related to position, but there isn't one clear position that causes them. Its more just any non-neurtral position makes her he did try scheduled Aleeve for 5 days to see if this would help with symptoms but it did not.     She has been previously evaluated by ENT, VNG and audiometry normal.  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 Reports long history of motion sickness. Flying has always been difficult because of this. Previously saw a doctor in [place] for migraines, these resolved after some period of time, unclear whether this was related to medications or just time. Those migraines were associated with visual aura and unilateral pain. She has had a few episodes of visual aura only since moving to [place]. Those episodes have been separate from the current intermittent vertigo.     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      Functional dyspepsia [K30] 07/19/2016    Pelvic floor dysfunction [M62.89] 05/04/2015    Chronic idiopathic constipation [K59.04] 05/04/2015    Nausea [R11.0] 05/04/2015    OAB (overactive bladder) [N32.81] 03/05/2015     Doing 2 studies at [facility]    Last Assessment & Plan:   Evaluation by urology Dr. 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 No changes, encouraged HEP, continue core strength.       Osteoporosis [M81.0] 11/21/2014    Irritable bowel syndrome [K58.9] 08/25/2014    Hypertension [I10] 07/07/2014     Overview:    Borderline     Last Assessment & Plan:   Formatting of this note might be different from the original.  Improved on low dose ARB -but higher dose exacerbates BPPV symptoms -  Added HCTZ 05/2011 and saw cards 11/2011 -  Changed to low dose beta-blocker 10/2013 because of headache, but declined to start same as home BP log with SBP range 110-140  The current treatment appears effective;  continue present plan and medications.     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 Encourage patient to continue the home exercises and continue to work posture changes. We discussed ergonomic changes during the workday. Patient will also make these changes when driving. Patient will come back and see me again in 6 weeks for further evaluation and treatment.      Hyperlipidemia [E78.5] 01/21/2010     Overview:   Mild       Last Assessment & Plan:   Treat BP if elevated         Medications:  Outpatient Medications Marked as Taking for the 07/18/17 encounter (Office Visit) with [name], MD   Medication Sig Dispense Refill    amLODIPine 2.5 MG Oral Tab Take 1 tablet (2.5 mg) by mouth daily. 90 tablet 0    FISH OIL        HydroCHLOROthiazide 25 MG Oral Tab Take 1 tablet (25 mg) by mouth daily. 90 tablet 0    Latanoprostene Bunod (VYZULTA) 0.024 % Ophthalmic Solution       Losartan Potassium 100 MG Oral Tab Take 1 tablet (100 mg) by mouth daily. 90 tablet 1    Magnesium 400 MG Oral Tab Take by mouth daily.      Timolol Maleate 0.5 % Ophthalmic GEL FORMING SOLUTION Place 1 drop in each EYE every morning.  1       Allergies:  Review of patient's allergies indicates:  No Known Allergies    Review of Systems:   ROS:  Constitutional: negative for fever, chills  Eyes: negative visual loss, photophobia  Ears, Nose, Mouth, Throat:  negative for tinnitus, trouble swallowing  Cardiovascular: negative for chest pain, palpitations   Respiratory: negative for dyspnea, cough   Gastrointestinal: positive for nausea, negative for vomiting   Genitourinary: positive for frequent urination, negative for dysuria, hematuria   Musculoskeletal: negative   Skin: negative for rash  Neurological: As noted in HPI above    Social History:  Residence/Occupation - Designer, 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  EtOH - wine with dinner, intermittently  Tobacco - never  Illicits - marijuana    EXAM  BP 137/48    Pulse 59    Temp 97.2 F (36.2 C) (Temporal)    Ht 5' 0.98" (1.549 m)    Wt 118 lb (53.5 kg)    SpO2  99%    BMI 22.31 kg/m   Gen: Patient seated comfortably in chair, talkative, pleasant, in NAD.  Resp: non-labored   CV: RRR  Skin: intact    MS: awake, alert, oriented to person, place, time and situation, speech fluent with no dysarthria, no paraphasic errors. Comprehension intact. Attention intact with ability to state days of week backwards. Able to ID objects.     CN: VFF full, PERRL, EOMI w/ no nystagmus, no diplopia, facial sensation intact, facial motor strength is full, palate elevates symmetrically, trap with full strength, tongue midline.    Motor: normal bulk and tone. No pronator drift, no orbiting. Strength 5/5 throughout bilaterally.     Sensation: Light touch intact throughout the upper and lower extremities. Romberg negative.    Coordination: FNF intact with no dysmetria.    Movement: no abnormal movements noted.    Gait: normal base. Ambulates with ease.  Able to walk on toes, heels and tandem walk without difficulty.      ASSESSMENT/RECOMMENDATIONS:  1. Cervicogenic migraine  - REFERRAL TO NEUROLOGY  - REFERRAL TO NEURODIAGNOSTIC TESTING    2. Vertigo, intermittent  - REFERRAL TO NEUROLOGY  - REFERRAL TO NEURODIAGNOSTIC TESTING      In my opinion, Ms. Plessinger presents with episodic vertigo, nausea triggered by position changes (rolling over in bed, head  extension/flexion, bending over and standing quickly >> horizontal head turning). On exam, no focal deficits. I have trialed nortriptyline for migraine prophylaxis which did not help, she is now on magnesium per ENT for about one week, which I recommended she continue. She tried vestibular rehab, but stopped as the Epley maneuver would trigger symptoms every time with no improvement. She is actively working on BP management with PCP. Recently decreased amlodipine due to low BP. Today has low diastolic but she is asymptomatic. I recommended, after discussion with PCP, that she switch HCTZ to chlorthalidone and take in the morning.     Overall, as indicated last visit, I have low suspicion of a vascular etiology for these symptoms. I am also not convinced these episodes are migraine related. I have referred her to neurology autonomic specialist, Dr. Ermalinda Barrios, for evaluation and a tilt table test.     I would like to see the patient again in 8-9 months or after she has been evaluated by Dr. Ermalinda Barrios. Thank you for allowing me to participate in the care of this very pleasant patient.     [name], MD, MPH  Neurology/Stroke Attending Physician

## 2017-07-25 ENCOUNTER — Encounter (HOSPITAL_BASED_OUTPATIENT_CLINIC_OR_DEPARTMENT_OTHER): Payer: Self-pay | Admitting: Neurology

## 2017-07-25 ENCOUNTER — Telehealth (INDEPENDENT_AMBULATORY_CARE_PROVIDER_SITE_OTHER): Payer: Self-pay | Admitting: Internal Medicine

## 2017-07-25 DIAGNOSIS — I10 Essential (primary) hypertension: Secondary | ICD-10-CM

## 2017-07-25 NOTE — Telephone Encounter (Signed)
Pt calling and states that she remembers PCP talking to her about changing her medication HCTZ  To Chlorthalidone       pt is currently needing a refill for her HCTZ and if there is a change, please send to replace HCTZ     Both meds pended for your consideration

## 2017-07-26 ENCOUNTER — Ambulatory Visit (INDEPENDENT_AMBULATORY_CARE_PROVIDER_SITE_OTHER): Payer: Medicare PPO | Admitting: Family

## 2017-07-26 VITALS — BP 126/67 | HR 58 | Temp 97.0°F | Resp 14 | Wt 120.0 lb

## 2017-07-26 DIAGNOSIS — Z6822 Body mass index (BMI) 22.0-22.9, adult: Secondary | ICD-10-CM

## 2017-07-26 DIAGNOSIS — R300 Dysuria: Secondary | ICD-10-CM

## 2017-07-26 DIAGNOSIS — N3001 Acute cystitis with hematuria: Secondary | ICD-10-CM

## 2017-07-26 LAB — PR U/A AUTO DIPSTICK ONLY, ONSITE
Bilirubin, Urine: NEGATIVE
Glucose, Urine: NEGATIVE mg/dL
Ketones, URN: NEGATIVE mg/dL
Nitrite, URN: NEGATIVE
Specific Gravity, Urine: >=1.03 — AB (ref 1.005–1.030)
Urobilinogen, URN: 0.2 E.U./dL (ref 0.2–1.0)
pH, URN: 6 (ref 5.0–8.0)

## 2017-07-26 MED ORDER — SULFAMETHOXAZOLE-TRIMETHOPRIM 800-160 MG OR TABS
1.0000 | ORAL_TABLET | Freq: Two times a day (BID) | ORAL | 0 refills | Status: AC
Start: 2017-07-26 — End: 2017-07-31

## 2017-07-26 NOTE — Progress Notes (Signed)
[facility]      Chief Complaint   Patient presents with    Urine Problem     Frequency, retention, and dysuria started Monday. Denies blood or odor.        SUBJECTIVE:  Robin Hart is a 77 year old female who presents with UTI symptoms. Onset x 2 days. Onset dysuria, urgency, frequency. She has a history of overactive bladder, noting incontinence as a result.  She notes that she has on a diuretic such as also she has more urinary frequency.  She also states that she's recently had diarrhea.  She's had a uti in the past years ago.   She has a uri and feels feverish, chills with fever 3 days ago with fever up to 101.   She was nauseated yesterday but this is not new.  She states that she has a history of nausea as well as a history of chronic low back pain.    Review of Systems   Constitutional: Positive for chills. Negative for fever and malaise/fatigue.   Gastrointestinal: Positive for nausea.   Genitourinary: Positive for dysuria, frequency and urgency. Negative for flank pain and hematuria.   Musculoskeletal: Positive for back pain.       I have  personally reviewed the ROS and past medical history with the patient.        Review of patient's allergies indicates:  Allergies   Allergen Reactions    Adhesives Itching         BP 126/67    Pulse 58    Temp 97 F (36.1 C) (Temporal)    Resp 14    Wt 120 lb (54.4 kg)    SpO2 98%    BMI 22.69 kg/m     OBJECTIVE:  Physical Exam   Constitutional: She is oriented to person, place, and time and well-developed, well-nourished, and in no distress.   HENT:   Head: Normocephalic and atraumatic.   Pulmonary/Chest: Effort normal. No respiratory distress.   Abdominal: Soft. Normal appearance and bowel sounds are normal. There is no tenderness. There is no CVA tenderness.   Musculoskeletal: Normal range of motion.   Neurological: She is alert and oriented to person, place, and time. Gait normal.      A/P:  1. Acute cystitis with  hematuria  Patient is symptomatic for UTI.  Will treat with Bactrim. Kidney function in February 2019 normal. Discussed medication, side effects, UTI prevention.  Send for culture.  Follow-up with urinary culture in 3-4 days.  Patient agrees to plan.    - Urine Culture  - Sulfamethoxazole-Trimethoprim (BACTRIM DS) 800-160 MG Oral Tab; Take 1 tablet by mouth 2 times a day for 5 days. Take until gone.  Dispense: 10 tablet; Refill: 0    2. Dysuria  Return to clinic if symptoms worsen, do not improve, or new symptoms develop.   - U/A AUTO DIPSTICK ONLY, ONSITE

## 2017-07-26 NOTE — Patient Instructions (Signed)
Patient Education     Bladder Infection, Female (Adult)    Urine is normally doesn't have any bacteria in it. But bacteria can get into the urinary tract from the skin around the rectum. Or they can travel in the blood from elsewhere in the body. Once they are in your urinary tract, they can cause infection in the urethra (urethritis), the bladder (cystitis), or the kidneys (pyelonephritis).  The most common place for an infection is in the bladder. This is called a bladder infection. This is one of the most common infections in women. Most bladder infections are easily treated. They are not serious unless the infection spreads to the kidney.  The phrases "bladder infection," "UTI," and "cystitis" are often used to describe the same thing. But they are not always the same. Cystitis is an inflammation of the bladder. The most common cause of cystitis is an infection.  Symptoms  The infection causes inflammation in the urethra and bladder. This causes many of the symptoms. The most common symptoms of a bladder infection are:  · Pain or burning when urinating  · Having to urinate more often than usual  · Urgent need to urinate  · Only a small amount of urine comes out  · Blood in urine  · Abdominal discomfort. This is usually in the lower abdomen above the pubic bone.  · Cloudy urine  · Strong- or bad-smelling urine  · Unable to urinate (urinary retention)  · Unable to hold urine in (urinary incontinence)  · Fever  · Loss of appetite  · Confusion (in older adults)  Causes  Bladder infections are not contagious. You can't get one from someone else, from a toilet seat, or from sharing a bath.  The most common cause of bladder infections is bacteria from the bowels. The bacteria get onto the skin around the opening of the urethra. From there, they can get into the urine and travel up to the bladder, causing inflammation and infection. This usually happens because of:  · Wiping improperly after urinating. Always wipe  from front to back.  · Bowel incontinence  · Pregnancy  · Procedures such as having a catheter inserted  · Older age  · Not emptying your bladder. This can allow bacteria a chance to grow in your urine.  · Dehydration  · Constipation  · Sex  · Use of a diaphragm for birth control   Treatment  Bladder infections are diagnosed by a urine test. They are treated with antibiotics and usually clear up quickly without complications. Treatment helps prevent a more serious kidney infection.  Medicines  Medicines can help in the treatment of a bladder infection:  · Take antibiotics until they are used up, even if you feel better. It is important to finish them to make sure the infection has cleared.  · You can use acetaminophen or ibuprofen for pain, fever, or discomfort, unless another medicine was prescribed. If you have chronic liver or kidney disease, talk with your healthcare provider before using these medicines. Also talk with your provider if you've ever had a stomach ulcer or gastrointestinal bleeding, or are taking blood-thinner medicines.  · If you are given phenazopydridine to reduce burning with urination, it will cause your urine to become a bright orange color. This can stain clothing.  Care and prevention  These self-care steps can help prevent future infections:  · Drink plenty of fluids to prevent dehydration and flush out your bladder. Do this unless you must restrict fluids for other health reasons, or your doctor   told you not to.  · Proper cleaning after going to the bathroom is important. Wipe from front to back after using the toilet to prevent the spread of bacteria.  · Urinate more often. Don't try to hold urine in for a long time.  · Wear loose-fitting clothes and cotton underwear. Avoid tight-fitting pants.  · Improve your diet and prevent constipation. Eat more fresh fruit and vegetables, and fiber, and less junk and fatty foods.  · Avoid sex until your symptoms are gone.  · Avoid caffeine,  alcohol, and spicy foods. These can irritate your bladder.  · Urinate right after intercourse to flush out your bladder.  · If you use birth control pills and have frequent bladder infections, discuss it with your doctor.  Follow-up care  Call your healthcare provider if all symptoms are not gone after 3 days of treatment. This is especially important if you have repeat infections.  If a culture was done, you will be told if your treatment needs to be changed. If directed, you can call to find out the results.  If X-rays were done, you will be told if the results will affect your treatment.  Call 911  Call 911 if any of the following occur:  · Trouble breathing  · Hard to wake up or confusion  · Fainting or loss of consciousness  · Rapid heart rate  When to seek medical advice  Call your healthcare provider right away if any of these occur:  · Fever of 100.4ºF (38.0ºC) or higher, or as directed by your healthcare provider  · Symptoms are not better by the third day of treatment  · Back or belly (abdominal) pain that gets worse  · Repeated vomiting, or unable to keep medicine down  · Weakness or dizziness  · Vaginal discharge  · Pain, redness, or swelling in the outer vaginal area (labia)  Date Last Reviewed: 01/03/2015  © 2000-2017 [employer] [address], [place] [address]. All rights reserved. This information is not intended as a substitute for professional medical care. Always follow your healthcare professional's instructions.

## 2017-07-27 LAB — URINE C/S: Culture: 11000

## 2017-07-27 MED ORDER — CHLORTHALIDONE 25 MG OR TABS
25.0000 mg | ORAL_TABLET | Freq: Every day | ORAL | 3 refills | Status: DC
Start: 2017-07-27 — End: 2017-12-15

## 2017-07-27 NOTE — Telephone Encounter (Signed)
Rx changed to chlorathalidone. HCTZ discontinued.

## 2017-07-27 NOTE — Telephone Encounter (Signed)
Message relayed to pt. She will pick up the new med and start taking it in the mornings

## 2017-07-27 NOTE — Telephone Encounter (Signed)
LMTCB otherwise will try again after 3pm    Plan: relay message that her HCTZ has been changed to Chlorathalidone    CCR: Okay to transfer to me at [phone] until 5 or [phone]  [phone] until 5

## 2017-08-14 ENCOUNTER — Ambulatory Visit: Payer: Medicare PPO | Attending: Neurology

## 2017-08-14 DIAGNOSIS — R42 Dizziness and giddiness: Secondary | ICD-10-CM | POA: Insufficient documentation

## 2017-08-18 ENCOUNTER — Telehealth (INDEPENDENT_AMBULATORY_CARE_PROVIDER_SITE_OTHER): Payer: Self-pay | Admitting: Orthopaedic Surgery

## 2017-08-18 NOTE — Telephone Encounter (Signed)
General Message:    Detailed Message: Please call patient, she would like to discuss the next steps for her care.   Return Call: General message okay

## 2017-08-21 ENCOUNTER — Encounter (INDEPENDENT_AMBULATORY_CARE_PROVIDER_SITE_OTHER): Payer: Medicare PPO | Admitting: Orthopaedic Surgery

## 2017-08-21 NOTE — Telephone Encounter (Signed)
Called and advised that if she is not improving with Physical Therapy we can try and injection targeting her pain.     She is doing well with Physical Therapy and just wanted to let us know. Advised to f/u as needed if her pain returns.

## 2017-09-04 NOTE — Progress Notes (Signed)
BPPV r/o. Balance and vestibular testing normal.  MRI pending.  Services d/c.

## 2017-10-24 ENCOUNTER — Other Ambulatory Visit (INDEPENDENT_AMBULATORY_CARE_PROVIDER_SITE_OTHER): Payer: Self-pay | Admitting: Internal Medicine

## 2017-10-24 DIAGNOSIS — I10 Essential (primary) hypertension: Secondary | ICD-10-CM

## 2017-10-25 MED ORDER — LOSARTAN POTASSIUM 100 MG OR TABS
100.0000 mg | ORAL_TABLET | Freq: Every day | ORAL | 0 refills | Status: DC
Start: 2017-10-25 — End: 2017-12-31

## 2017-12-05 ENCOUNTER — Encounter (HOSPITAL_BASED_OUTPATIENT_CLINIC_OR_DEPARTMENT_OTHER): Payer: Self-pay | Admitting: Neurology

## 2017-12-06 ENCOUNTER — Encounter (INDEPENDENT_AMBULATORY_CARE_PROVIDER_SITE_OTHER): Payer: Self-pay | Admitting: Internal Medicine

## 2017-12-15 ENCOUNTER — Ambulatory Visit (INDEPENDENT_AMBULATORY_CARE_PROVIDER_SITE_OTHER): Payer: Medicare PPO | Admitting: Internal Medicine

## 2017-12-15 ENCOUNTER — Encounter (INDEPENDENT_AMBULATORY_CARE_PROVIDER_SITE_OTHER): Payer: Self-pay | Admitting: Internal Medicine

## 2017-12-15 VITALS — BP 114/61 | HR 63 | Temp 97.9°F | Resp 16 | Ht 60.98 in | Wt 120.4 lb

## 2017-12-15 DIAGNOSIS — Z6822 Body mass index (BMI) 22.0-22.9, adult: Secondary | ICD-10-CM

## 2017-12-15 DIAGNOSIS — K5904 Chronic idiopathic constipation: Secondary | ICD-10-CM

## 2017-12-15 DIAGNOSIS — M1810 Unilateral primary osteoarthritis of first carpometacarpal joint, unspecified hand: Secondary | ICD-10-CM

## 2017-12-15 DIAGNOSIS — I10 Essential (primary) hypertension: Secondary | ICD-10-CM

## 2017-12-15 DIAGNOSIS — Z0000 Encounter for general adult medical examination without abnormal findings: Secondary | ICD-10-CM

## 2017-12-15 MED ORDER — CHLORTHALIDONE 25 MG OR TABS
12.5000 mg | ORAL_TABLET | Freq: Every day | ORAL | 3 refills | Status: DC
Start: 2017-12-15 — End: 2019-01-21

## 2017-12-15 MED ORDER — DICLOFENAC SODIUM 1 % TD GEL
2.0000 g | Freq: Four times a day (QID) | TRANSDERMAL | 1 refills | Status: DC | PRN
Start: 2017-12-15 — End: 2018-03-05

## 2017-12-15 NOTE — Patient Instructions (Addendum)
Adding miralax with a big cup of water every morning    Use senna at night until things are going again     Podiatry:    [facility]  [address], [place] [address]     Or Dr Olevia Bowens    Here are screening & prevention measures recommended for you:  Health Maintenance   Topic Date Due    Zoster Vaccine (2 of 3) 05/30/2012    Influenza Vaccine (1) 01/02/2018    Osteoporosis Screening  09/08/2018    Depression Screening (PHQ-2)  12/16/2018    Breast Cancer Screening  06/13/2019    Lipid Disorders Screening  08/05/2020    Tetanus Vaccine  04/16/2022    Pneumococcal Vaccine: 65+ years  Completed    Pneumococcal Vaccine: Pediatrics (0-5 years) and At-Risk Patients (6-64 years)  Aged Out       Based on todays evaluation, I recommend the following ways to improve your health or functioning:     Add more intensity to the cardio  See podiatry  Flu shot!   We reduced the chlorthalidone     You have the following risk factors and/or medical conditions for which there are recommended ways (included in the list) to help you stay as healthy as possible:  1.    hypertension       Please plan to have a Subsequent Annual Wellness Visit in 1 year.    It was a pleasure to see you in clinic today. Your Medical Assistant was: Psychiatrist, [place].          You can schedule an appointment to see [place] by calling  [phone] or via eCare.     If labs were ordered today the results are expected to be available via eCare 5 days later. Otherwise, result letters are mailed 7-10 days after your tests are completed. If your physician needs to change your care based on your results, you will receive a phone call to notify you. If you haven't heard from him/her and it has been more than 10 days please give [place] a call.     You may receive a survey in the mail asking how your experience has been with [facility].  Your input and opinions are very important to [place].  I hope you are able  to take the time to complete this survey.    Thank you for choosing [facility].

## 2017-12-15 NOTE — Progress Notes (Signed)
Robin Hart is a 77 year old female who presents for an Annual Wellness Visit.   []  Initial   [x]  Subsequent     INFORMATION GATHERING:  The following areas were confirmed with patient/caregiver and/or updated in Epic at this visit (required):    [x]  Past Medical History   [x]  Past Surgical History   [x]  Family History   [x]  Social History    [x]  Current medications and supplements (including vitamins and calcium)   [x]  Allergies    The information below is up to date at the end of this  visit (required):  Review of functional ability, hearing, fall risk and safety, diet, physical activity, and health habits  (via HRA or direct review with patient or caregiver)    The Health Risk Assessment (HRA) for today's visit (required) was completed (check one):   [x]   as Patient-Entered Questionnaire via eCare (visible in eCare Questionnaire                     Encounter and in Synopsis)   []  as paper HRA form, completed by or with the patient or caregiver (reminder:                    paper form, if completed, must be scanned to Media)   []  in HRA template documentation, completed by staff or provider with patient or  caregiver at visit (for use when HRA was not completed prior to rooming)      List of current providers and suppliers (check all that apply):   [x]   See Care Team Section   [x]   See EHR Encounters for [facility] Providers involved in care   [x]   Other providers and suppliers outside [facility]     Depression screening:  PHQ-2: 0  PHQ-9 (if done):      EXAM:  BP 114/61    Pulse 63    Temp 97.9 F (36.6 C) (Temporal)    Resp 16    Ht 5' 0.98" (1.549 m)    Wt 120 lb 6.4 oz (54.6 kg)    SpO2 99%    Breastfeeding? No    BMI 22.76 kg/m   GAIT:    [x]  Normal, stable, independent    []  Abnormal (describe):    As assessed by:     []  Direct Observation     []  Timed Up and Go     []  Other:  COGNITION:    [x]  Intact    []  Abnormal (describe):     As assessed by:      []  Direct  Observation     []  Brief Cognitive Screen      ADVANCE CARE PLANNING (ACP) (optional)  Advance care planning:   []  Patient Accepted   [x]  Patient Declined     Check all that apply:   []  Advance directives are on file in her chart   []  Explained & discussed advance directives at this visit   []  Completed advance care planning form(s) at this visit   []  Other Advance Care Planning discussion at this visit (describe):       Time Spent on ACP: [x]  None       []  1-15 minutes      []  16-30 minutes      []  >30 minutes      ASSESSMENT:  Robin Hart was seen for her Annual Wellness Visit, including identification of risk  factors & conditions that may affect her health and function in the future.   (Z00.00) Routine general medical examination at a health care facility  (primary encounter diagnosis)    (I10) Essential hypertension: Too tightly controlled.  Reduce chlorthalidone to 12.5 mg.  Repeat metabolic panel in a few weeks.  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: [facility] for additional recommendations             Actions at this visit (all 3 required):   [x]   Establishing or updating a written schedule of screening and prevention  measures recommended and appropriate for Robin Hart for the next 5-10 years   [x]   Establishing or updating a list of her risk factors and conditions for which  lifestyle or medical interventions are recommended or underway, including  mental health risks and conditions, and including risks/benefits of treatment   [x]   Furnishing personalized health advice and, as appropriate, referrals to health  education or preventive counseling services or programs (such as fall  prevention, tobacco cessation, physical activity, nutrition, weight  loss)      COUNSELING:      Here are screening & prevention measures recommended for you:  Health Maintenance   Topic Date Due    Zoster Vaccine (2 of 3) 05/30/2012    Influenza Vaccine (1) 01/02/2018    Osteoporosis Screening  09/08/2018    Depression Screening (PHQ-2)  12/16/2018    Breast Cancer Screening  06/13/2019    Lipid Disorders Screening  08/05/2020    Tetanus Vaccine  04/16/2022    Pneumococcal Vaccine: 65+ years  Completed    Pneumococcal Vaccine: Pediatrics (0-5 years) and At-Risk Patients (6-64 years)  Aged Out       Based on todays evaluation, I recommend the following ways to improve your health or functioning:     Add more intensity to the cardio  See podiatry  Flu shot!   We reduced the chlorthalidone     You have the following risk factors and/or medical conditions for which there are recommended ways (included in the list) to help you stay as healthy as possible:  1.    hypertension       Please plan to have a Subsequent Annual Wellness Visit in 1 year.    ----------------------------------------------------------------------------------------------  This is a record of add'l notes taken     [name] a 77 year old female reports:     Vertigo finally called down. Still positional but manageable.  She does walking and a weightlifting program at the gym. Does [facility] at the [facility].  She would like to increase her stamina to be able to walk 5 miles and she would like to be able to hike.  At her tilt table test she had low pressures.  Pressures at home are one teens to 379K over 24O diastolic.  She is not orthostatic.    Ears still itch periodically.  Can she continue using fluocinonide on and off?    Constipation. Really bad. Water, prunes, dulcolax.  What else can she do?    Her PT is really helpful for hips, back and leg discomfort.   She also has arthritis in her hands particularly at the base of the thumbs.  It is hard to do the buckles on her grandsons car seat.    Feet are  bothering her a lot.  Her PT recommended seeing a podiatrist.  What do I think?    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  She is going to need cataract surgery at some point and they're monitoring her glaucoma.  But the biggest problem is keeping up with the effort required to keep the blepharitis under control.      Vitals:    12/15/17 1322   BP: 114/61   BP Cuff Size: Regular   BP Site: Left Arm   BP Position: Sitting   Pulse: 63   Resp: 16   Temp: 97.9 F (36.6 C)   TempSrc: Temporal   SpO2: 99%   Weight: 120 lb 6.4 oz (54.6 kg)   Height: 5' 0.98" (1.549 m)       no apparent distress  There is a little bit of pink discoloration of the lids but no crusting seen.  She is wearing her glasses.  Mentation is normal, speech is fluent and full.  She is oriented.  Her memory seems normal for age.  Mood is calm and appropriate.  Normal WOB, no cough.   Normal gait  Joint hypertrophy noted at several areas of the hands

## 2017-12-15 NOTE — Progress Notes (Signed)
Visit:   Chief Complaint   Patient presents with    Annual Exam     Patient is here today for an annual wellness exam         Refills? NO  Referral? NO  Letter or Form? NO  Lab Results? NO    HEALTH MAINTENANCE:  Has the patient has this done since their last visit?  Cervical screening/PAP: N/A  Mammo: N/A  Colon Screen: N/A  Diabetic Eye Exam (If applicable): N/A    Have you seen a specialist since your last visit: No    Vaccines Due? Yes, zoster    Does patient have eCare?  yes    HM Due:   Health Maintenance   Topic Date Due    OT Plan of Care  [birth year]    Zoster Vaccine (2 of 3) 05/30/2012    Depression Screening (PHQ-2)  10/28/2017    Influenza Vaccine (1) 01/02/2018    Lipid Disorders Screening  08/05/2020    Tetanus Vaccine  04/16/2022    Osteoporosis Screening  Completed    Pneumococcal Vaccine: 65+ years  Completed    Pneumococcal Vaccine: Pediatrics (0-5 years) and At-Risk Patients (6-64 years)  Aged Out       PCP Verified?  Yes, Volkman, Mathews Robinsons, MD    From  Burr Medico To  Samella Parr, MD Sent  12/11/2017 7:03 PM   Patient Questionnaire Submission   --------------------------------     Questionnaire: Medicare Annual Wellness Visit Questionnaire     ~~~~~~~~~~~~~~~~~~~~~~~~~~~~~~~~   Your answers to all the following questions will help the provider identify your preventive care needs and possible health risks, and allow more time for discussion during the visit.    Question: Please list care providers that are outside of [facility].    Answer:          ~~~~~~~~~~~~~~~~~~~~~~~~~~~~~~~~   Self Assessment of Health    Question: 1. How do you rate your overall health the past four weeks?    Answer:  Excellent      Question: 2. Can you manage your overall health?    Answer:  Yes      Question: 3. Because of any health problems, do you need the help of another person with your personal care needs such as eating, bathing, dressing, or  getting around the house?    Answer:  No      Question: 4. Do you often get the emotional suport you need?    Answer:  Usually         ~~~~~~~~~~~~~~~~~~~~~~~~~~~~~~~~   Psychosocial Health    Question: 5. Feelings that caused you distress or interfered your ability to get along socially with family or friends?    Answer:  Not at all      Question: 6. Feeling stress over health, finances, relationships or work?    Answer:  Not at all      Question: 7. Body pain?    Answer:  Several days      Question: 8. Fatigue?    Answer:  Several days         ~~~~~~~~~~~~~~~~~~~~~~~~~~~~~~~~   Health and Habits    Question: 9. In the past 7 days, how many days did you exercise?    Answer:  3      Question: 10. On days when you exercised, for how long did you exercise (in minutes)?    Answer:  45      Question: 11. How  intense was your typical exercise?    Answer:  Light (like stretching or slow walking)      Question: 12. In the past 7 days, how often did you eat 3 or more servings of fruits and vegetables in a day?    Answer:  More than half the days      Question: 13. In the past 7 days, how often did you eat 3 or more servings of high fiber or whole grain foods in a day?    Answer:  More than half the days      Question: 14. How would you describe the condition of your mouth and teeth, including false teeth or dentures?    Answer:  Excellent      Question: 15. Do you find yourself having trouble hearing people speak?    Answer:  No      Question: 16. Do you wear a hearing aid/device?    Answer:  No      Question: 17. Do you always use your seat belt in the car?    Answer:  Yes      Question: 18. Do you have a fire extinguisher in your home?    Answer:  Yes      Question: 19. Do you have a smoke detector?    Answer:  Yes         ~~~~~~~~~~~~~~~~~~~~~~~~~~~~~~~~   For the next five questions, in your present state  of health, how much difficulty do you have with the following activities?    Question: 20. Preparing food and eating    Answer:  I can do this myself      Question: 21. Bathing yourself    Answer:  I can do this myself      Question: 22. Getting dressed    Answer:  I can do this myself      Question: 23. Using the toilet    Answer:  I can do this myself      Question: 24. Moving around from place to place    Answer:  I can do this myself      Question: 25. Please check any aids or devices that you usually use for any of the above activities:    Answer:  None of the above      Question: 26. In the past year have you fallen or had a near fall?    Answer:  No      Question: 27. Are you afraid of falling?    Answer:  No      Question: 28. Do you have issues with balance or feeling unsteady?     Answer:  No      Question: 29. Do you feel safe in your home environment?    Answer:  Yes      Question: 30. Is there anything in your home that might make you trip or slip, and fall?    Answer:  No      Question: 31. Do you ever leak urine or stool?    Answer:  Yes      Question: 32. Do you wear a liner, pad, or special underwear because of leakage?    Answer:  Yes         ~~~~~~~~~~~~~~~~~~~~~~~~~~~~~~~~   For the following seven questions, in your present state of health, how much difficulty do you have with the following activities?    Question: 103. Shopping    Answer:  I can do  this by myself      Question: 34. Using the telephone    Answer:  I can do this by myself      Question: 61. Housekeeping    Answer:  I can do this by myself      Question: 70. Laundry    Answer:  I can do this by myself      Question: 58. Driving or using transportation    Answer:  I can do this by myself      Question: 32. Managing own finances    Answer:  I can do this by myself      Question: 39. Taking your own  medications    Answer:  I can do this by myself         ~~~~~~~~~~~~~~~~~~~~~~~~~~~~~~~~   Signs of Memory Issues    Question: 40. Have you experienced any memory issues or problems with thinking?    Answer:  No      Question: 41. Have any concerns about your memory been raised by family members, friends, caretakers, or others?    Answer:  No         ~~~~~~~~~~~~~~~~~~~~~~~~~~~~~~~~   Screening and Preventive Services    Question:  Have you had any vaccinations or screening tests outside of [place] since your last wellness visit? (For example cholesterol or diabetes screening blood tests, bone density screening tests, or cancer screening tests such as colonoscopy.) If so, please let us know which test(s) and where they were done:    Answer:  none         ~~~~~~~~~~~~~~~~~~~~~~~~~~~~~~~~   Advance Care Planning    Question: 42. POLST form (Physician orders for life-sustaining treatment)    Answer:  No      Question: 79. Living will (documents that make your health care wishes know, also called Advance Directive)    Answer:  Yes      Question: 59. Durable Power of Attorney for [place] (someone to make medical decisions for you in the event that you are unable to)    Answer:  Yes      Question: 45. Do you want to discuss advance care planning at your wellness visit?    Answer:  No

## 2017-12-27 ENCOUNTER — Other Ambulatory Visit (INDEPENDENT_AMBULATORY_CARE_PROVIDER_SITE_OTHER): Payer: Self-pay | Admitting: Internal Medicine

## 2017-12-27 DIAGNOSIS — I10 Essential (primary) hypertension: Secondary | ICD-10-CM

## 2017-12-27 MED ORDER — AMLODIPINE BESYLATE 2.5 MG OR TABS
ORAL_TABLET | ORAL | 1 refills | Status: DC
Start: 2017-12-27 — End: 2018-04-19

## 2017-12-31 ENCOUNTER — Other Ambulatory Visit (INDEPENDENT_AMBULATORY_CARE_PROVIDER_SITE_OTHER): Payer: Self-pay | Admitting: Internal Medicine

## 2017-12-31 DIAGNOSIS — I10 Essential (primary) hypertension: Secondary | ICD-10-CM

## 2018-01-01 MED ORDER — LOSARTAN POTASSIUM 100 MG OR TABS
ORAL_TABLET | ORAL | 1 refills | Status: DC
Start: 2018-01-01 — End: 2018-06-29

## 2018-01-17 NOTE — Progress Notes (Signed)
NEUROLOGY HEADACHE CLINIC NEW PATIENT NOTE     CHIEF COMPLAINT   Headaches    Referring physician: Raylene Hart  Primary care provider: Samella Parr, MD     HISTORY OF PRESENT ILLNESS  Robin Hart is a 77 year old right-handed woman with history of hypertension who presents for evaluation of vertigo and headaches.     Robin Hart was last in her usual state of health on October 15th, 2018. That day, she was in a physical therapy appointment working with a therapy ball. She was bouncing on the ball and developed acute onset vertigo, described as a sense of motion about her. She subsequently developed vertigo, which could last hours at a time, triggered by head movement. The vertigo was associated with nausea. She describes a more mild baseline vertigo, as well; about 50% of the time, she has a mild-moderate dull headache located at bifrontally, rated 4/10 in severity, present through much of the day. The headache sometimes has a low intensity throbbing pain associated with it. If she sleeps for 30 minutes or drinks a Coke, the headache may be alleviated.     Over the following months, she did not treat the vertigo, and it improved on its own with time. In the Spring of 2019, the vertigo reached a plateau, and now she only feels a sense of motion around her intermittently when she turns her head quickly.     She denies other neurologic symptoms, including double vision, blurry vision (apart from her baseline), difficulty with speech or swallow, loss of consciousness, tingling, and weakness. She denies hearing loss, tinnitus, and hyperacusis.     She notes that since the onset of her symptoms, her blood pressure, which previously had been borderline elevated, became significantly elevated, requiring multiple antihypertensives.     She was seen by ENT, where she was found to have a normal audiogram, videonystagmography, and auditory brainstem response. 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 She underwent tilt table testing, which showed evidence of orthostatic hypotension, but may have been affected by some of Robin Hart's antihypertensives (losartan, amlodipine, and chlorthalidone).    She has tried meclizine (did not help) and Aleve (did not help). In general, she prefers not to take medications.     Robin Hart states she has had a longstanding history of motion sickness. Even during young adulthood when her children in the backseat, she had to turn her head very slowly in order to see what her children were doing, to avoid severe dizziness. She additionally has a history of migraine headaches, for which she saw a neurologist in [place]. She states he recommended she start preventative therapy, but she declined. She describes her migraines as first occurring in 2008, and involving bifrontal, throbbing pain rated 10/10 in severity, preventing her from performing activities, lasting hours at a time. They are associated with nausea; no light or sound sensitivity. About half the time, she experiences a visual aura prior to onset of the headache. She does not take over the counter medications for migraines. She has 5-6 migraines per year.     Current headache days per month: 12  Current severe headache days per month: 0  Aura present: Looks like a kaliedoscope for one hour  Triggers: For vertigo: only head movement; For migraine: No clear triggers.  Caffeine: She drinks 1  coke every 1-2 days.  Sleep: She falls asleep easily; she sleep for 3 hours, awake 2-3 hours, sometimes gets 1-2 hours. She has mild sleep apnea. She did not tolerate CPAP. Onset in 1980s-90s. She has tried cognitive behavioral modification; helped somewhat. Hard for her to slow down her thoughts when she awakens at night. Has not tried melatonin.    Mood/stress: No stress, very social. No depression, no anxiety.  Exercise: 2-3 times weekly  Current Medications/therapies:   Acute: Aleve (2 days per month for arthtitis)   Preventative: Magnesium  All medications/therapies tried in past:   Acute: Tylenol (did not help)   Prophylactic: None  Family history of headaches: None  Previously seen by: Dr. Donneta Hart, Dr. Tomi Hart    In general, since 2016 she has experienced numbness from her right buttock to her right foot. She has glaucoma, cataracts, and dry eyes. She has [name]. She would like to be able to return to yoga.       AS PER REDCAP SURVEY, COMPLETED BY PATIENT FOR VISIT:  First headache(s) occured when Robin Hart was 77 years old.  Headaches started to become a problem about 7 months ago. Robin Hart is having an average of 3 days of headache per month, with around 0 days of severe headache per month. The average pain severity of these headaches is 4 out of 10 with the most severe headaches rated as 8 out of 10. The headaches will often last 5 hours, occurring no specific time. Robin Hart believes that the headache(s) is caused by "rotation of my head." [name] has the following description of their headache: "I have vertigo that may or may not be preceded by vision distortion and followed by nausea and a headache."    Robin Hart has seen the following previous providers prior to this appointment: Dr. Raylene Hart    GOAL(S) OF THE VISIT Robin Hart.  Robin Hart has the following goals for this visit: Understand the cause of the pain     TREATMENT PREFERENCE(S).   Robin Hart prefers the following treatment options:   Biofeedback or meditation   Robin Hart is interested in learning about new medications and other new treatments.     TREATMENT MODALITIES.  Robin Hart is willing to participate in the following treatment modalities:   Exercise  Change diet  Learn meditation  Learn relaxation  techniques    CONCERNS REGARDING CHANGES IN HEADACHE AND RELATED SYMPTOMS.  Robin Hart is concerned that the headache have changed in the following qualities: There are other non-headache symptoms such as dizziness and vertigo     HEADACHE LOCATION:   Forehead     LATERALITY OF HEADACHE:   Bilateral     QUALITY OF HEADACHE PAIN   Dizzy     HEADACHE TRIGGERS.  Randi has the following headache triggers:   Rotating my head     VISUAL CHANGE(S) BEFORE OR DURING HEADACHE Seeing zigzag lines   Temporary blind spot (scotoma)   Blurred vision      ASSOCIATED HEADACHE SYMPTOMS.  Dizziness   Nausea     AUTONOMIC SYMPTOMS No autonomic symptoms during headache     EXACERBATION OF HEADACHES.  Azriel feels that the headaches are worse with:   Physical exertion    HEADACHE RELIEF.  Jocelynn finds the following to be helpful in relieving headaches:   Sleep (at night or nap)  Mogul states they are  generally sensitive to medications.   Teretha is currently not using any preventive medication(s) for headache(s).     PREVENTIVE MEDICATIONS TRIED THAT HAVE BEEN GIVEN ADEQUATE TRIAL FOR HEADACHE  (At least 3 months at a therapeutic dose)  None     THERAPIES TRIED   Physical Therapy   Massage Therapy   Exercise  walking, few times per week     SUPPLEMENTS   Magnesium     NEUROIMAGING/ED:  Corrin reports they have never had CT head.  Nechama reports they have had MRI of the brain on 02-27-2017. Results of the scan per [name] are: no acute intracranial pathology.   Marguerite reports they have never sought care in the ER for headache.     SOCIAL HISTORY:  In general, Holli describes their overall health as: Good. In the past 3 months, headaches have interfered with their normal work (outside and housework): A little bit. 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 Currently retired, no longer working.   Minutes per week of moderate to vigorous exercise: 3  Servings of  carbohydrates per day: 3  Servings of vegetables and fruit per day: 2    Alvis does not smoke.  Tavie states that they drink 5 cups of caffeinated beverages per week.  Kamera states that they drink 1 alcoholic beverages per week.  Amiah endorses they have difficulty sleeping. See attached REDCap survey responses for more detail.     ADDITIONAL DATA:  PHQ-4 score is 0  PSS score is 7  STOP BANG score is 3    Review of Symptoms, reviewed with Nature today and uploaded from [facility].  Positives include: Dizziness, Trouble sleeping   Otherwise negative    Past Medical History:   HTN     Past Surgical History   Bladder resuspension   Uterus removed     FAMILY MEDICAL HISTORY  No family history of headaches    PHYSICAL EXAMINATION     VITAL SIGNS: BP 129/80    Pulse 76    Resp 16    Ht 5' 0.98" (1.549 m)    Wt 120 lb 12.8 oz (54.8 kg)    SpO2 97%    BMI 22.84 kg/m    GENERAL: Pleasant, well-appearing in no acute distress.  HEENT: Head normorcephalic, atraumatic.  PULMONARY: Breathing comfortably on room air.  CARDIOVASCULAR: Extremities warm and well-perfused.  NEURO:  Mental status: Awake, alert, and oriented to person, place, time, and situation. Speech clear, fluent.  Cranial nerves:  II: Visual fields full to confrontation bilaterally; optic discs sharp bilaterally on fundoscopic exam.  III/IV/VI: EOMI, PERRL 3.5-->69mm. No nystagmus.  V: Facial sensation full to pinprick in V1/V2/V3 distributions bilaterally.  VII: Facial expressions full and symmetric.  VIII: Hearing intact to finger rub bilaterally.  IX/X: Symmetric elevation of the soft palate.  XI: Strength 5/5 in shoulder elevation and neck rotation bilaterally.  XII: Tongue protrusion midline, normal lateral movements.  Motor exam: Normal bulk and tone. Muscle strength 5/5 bilaterally in interossei, grip, wrist f/e, elbow f/e, shoulder ab, hip f, knee f/e, ankle d/p. No pronator drift. No tremor.   Sensation:   Vibration: Full (11 seconds at  great toes bilaterally)  Proprioception: Full at great toes bilaterally  Pinprick: Full in all four extremities  Temperature: Full in all four extremities  DTRs:  -Biceps R1  L1  -Triceps R1  L1  -Brachioradialis R1  L1  -Patellar R1  L1  -Achilles R1  L1  -Toes downgoing bilaterally. No clonus.  Coordination: Finger to nose and heel to shin intact bilaterally.  Gait: Normal gait; no unsteadiness on heel-walking, toe-walking, and tandem gait. Hart negative.    IMAGING/DATA:  MRI brain stroke 02/27/17: IMPRESSION:  1. Foci of apparent microhemorrhage in the pons and thalami bilaterally with pattern most consistent with hypertensive disease.  2. Age-related cerebral volume loss and white matter FLAIR hyperintensities without evidence of infarcts or mass lesions.  3. Although not the focus of this noncontrast brain MRI for assessing for possible stroke, no gross evidence of internal auditory canal or inner ear lesions. If there is high concern of inflammatory or neoplastic disease along the vestibular nerve, a pre and postcontrast brain MRI focusing on the internal auditory canals may be helpful.    MRA head/neck 02/27/17: IMPRESSION:  No acute intracranial pathology. Mild-moderate cerebral and cerebellar volume loss and white matter findings suggestive of mild-moderate chronic small vessel ischemic disease. Bilateral thalamic and pontine foci of susceptibility artifact, suggestive of hemosiderin deposition from prior microhemorrhages or punctate calcifications. Mild narrowing of the bilateral P2 posterior cerebral artery segments. No intracranial aneurysm, AVM, stenosis, or occlusion. The V4 segment of the left vertebral artery is hypoplastic after giving rise to the left PICA. Proximal left vertebral artery is poorly visualized, likely secondary to artifact, however stenosis at this location is difficult to exclude.    ADDITIONAL DATA:   Reviewed medical notes from outside providers (Dr. Donneta Hart, Dr. Tomi Hart) and  available neuroimaging.    REVIEW  We reviewed with patient current medical history, medications, allergies, past medical history, surgical history, social history, family history, and review of systems. We updated all the information. See uploaded REDCap for additional information.    ASSESSMENT/RECOMMENDATIONS:  Ms. Shirl Weir is a 77 year old right-handed woman with history of hypertension and motion sickness, who presents for evaluation of vertigo and headaches. Her greatest concern today is vertigo, which peaked in severity in the last year, and improved relatively (now manageable) in the last six months. Her vertigo is triggered by position, and associated with nausea and dull, bifrontal, moderate headache. 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 The manifestations of vestibular migraine are varied and can include positional vertigo, constant imbalance, and movement-associated dysequilibrium, which are consistent with Kendre's presentation. Her physical exam today is unremarkable, and this in combination with her MRI brain/MRA head/neck, does not suggest secondary etiology of her headaches.    Complex diagnoses:  (G43.109) Migraine with vertigo  (primary encounter diagnosis)  (G43.109) Migraine with aura and without status migrainosus, not intractable  (G43.009) Migraine without aura and without status migrainosus, not intractable  (R51) Chronic daily headache  Comorbid diagnoses: IBS    We discussed a multi-modal approach for Mariabella's headache management, including preventative medications, acute medications, supplements, devices, and reduction of lifestyle headache risk factors, as below:    1. Preventive medications for migraine. We discussed options including topiramate, beta-blockers, calcium channel  blockers, SSRIs, SNRIs, Botox, and anti-CGRP medications. Trinh prefers not to start a preventative medication at this time, however she would be open to considering propranolol in the future, if this medication could take the place of one of her current antihypertensives.    2. Acute (abortive) medications, to be taken only at the onset of severe headache.   - Please limit them (in any combination) to a  total of fewer than 6 days per month, as they may cause medication overuse headaches.  - Additional dose of boswellia 80mg  for acute headache    3. Natural approaches for headache management:  - SAMe 200 mg a day  - Vitamin B2 400 mg daily    4. Over the counter supplements for headache prevention and acute treatment:  - Boswellia 800 mg 2-3 times per day (anti-inflammatory herb; e.g., Swanson's brand)  - Melatonin 5 to 10 mg (take 30 minutes prior to sleep) for sleep modulation   - Feverfew Tea 1-3 cups per day. Can get from buddhateas.com or tenzingmomo.com (anti-inflammatory)  - Magnesium Citrate 250 mg daily, slowly increase to [phone] mg daily. Possible side effects: diarrhea, so we recommend stopping at whatever dose is comfortable for your body without causing this side effect. This supplement can be very helpful for preventing headache, preventing migraine aura, calming nerves, muscles and even mood. Recommend starting slowly.     5. Diet: High protein, anti-inflammatory diet, such as [address] (decrease sugar and gluten, eat more fruits and vegetables) or 20/20 lifestyles. Consider Shared Nutrition visit with Tiajuana Amass, [place].    6. Exercise: Gentle exercise, increase slowly to at least 30 minutes daily 3-5 days per week.    7. Anxiety/stress: Deep breathing and mindfulness techniques- consider "Calm" mindfulness APP on the phone, or learning biofeedback in the clinic from Dr. Herbert Deaner, Tiajuana Amass, [place] or [place], [place]. We use [name] program and devices which can be purchased for at  home use as well. Explore [employer] Well-Being 8 week course, 5 minutes a day.     8. Devices: Consider [name] and/or Gammacore.    9. Consider sleep visit with Mui Chan-Goh, ARNP    10. Fun: Increase fun time spend with friends and family    Benefits, side effects, and alternatives were discussed. She understands the recommendations above. She will follow up with our Headache ARNP/PA team within 1-2 months.    I spent over 90 minutes face-to face with patient, more than 50% of this time face-to-face is spent on discussion with patient about the diagnosis; proposed treatment, risks and benefits of treatment; alternative treatments, their risks and benefits; prognosis with and without treatment, probable course of recovery. The patient is able to understand and repeat given information and is agreeable to the plan and recommendations. The patient had adequate time to ask questions, and had no further questions.

## 2018-01-18 ENCOUNTER — Ambulatory Visit: Payer: Medicare PPO | Attending: Neurology | Admitting: Neurology

## 2018-01-18 ENCOUNTER — Encounter (HOSPITAL_BASED_OUTPATIENT_CLINIC_OR_DEPARTMENT_OTHER): Payer: Self-pay | Admitting: Neurology

## 2018-01-18 VITALS — BP 129/80 | HR 76 | Resp 16 | Ht 60.98 in | Wt 120.8 lb

## 2018-01-18 DIAGNOSIS — R51 Headache: Secondary | ICD-10-CM | POA: Insufficient documentation

## 2018-01-18 DIAGNOSIS — R519 Headache, unspecified: Secondary | ICD-10-CM

## 2018-01-18 DIAGNOSIS — G43009 Migraine without aura, not intractable, without status migrainosus: Secondary | ICD-10-CM | POA: Insufficient documentation

## 2018-01-18 DIAGNOSIS — Z6822 Body mass index (BMI) 22.0-22.9, adult: Secondary | ICD-10-CM

## 2018-01-18 DIAGNOSIS — G43109 Migraine with aura, not intractable, without status migrainosus: Secondary | ICD-10-CM | POA: Insufficient documentation

## 2018-01-18 NOTE — Patient Instructions (Addendum)
Thank you for your visit today. You have a history of episodic migraine with and without aura, as well as migraine-associated vertigo.    We discussed a multi-modal approach for her headache management, including preventative medications, acute medications, supplements, devices, and reduction of lifestyle headache risk factors, as below:    1. Preventive medications. These medications must be taken every single day to decrease headache frequency and severity; it takes at least minimum of few weeks to see any results. The medication should be started at a small dose, and increased if no significant side effects.   - We will not start a preventative medication today, however if you would like to start one in the future, we may consider starting propranolol (which also helps with blood pressure; therefore, you hypothetically could discontinue one blood pressure medication and start propranolol in its place).     2. Acute (abortive) medications. These medications are to be taken only at the onset of severe headache. Please limit them (in any combination) to a total of fewer than 6 days per month, as they may cause medication overuse headaches.  - Additional dose of boswellia 800mg  for acute headache    3. Natural approaches for headache management:  - SAMe 200 mg a day  - Vitamin B2 400 mg daily    4. Over the counter supplements for headache prevention and acute treatment:  - Boswellia 800 mg 2-3 times per day (anti-inflammatory herb; e.g., Swanson's brand)  - Melatonin 5 to 10 mg (take 30 minutes prior to sleep) for sleep modulation   - Feverfew Tea 1-3 cups per day. Can get from buddhateas.com or tenzingmomo.com (anti-inflammatory)  - Magnesium Citrate 250 mg daily, slowly increase to [id number] mg daily. Possible side effects: diarrhea, so we recommend stopping at whatever dose is comfortable for your body without causing this side effect. This supplement can be very helpful for preventing headache, preventing  migraine aura, calming nerves, muscles and even mood. Recommend starting slowly.     5. Diet: High protein, anti-inflammatory diet, such as Ab[address]cal Way (decrease sugar and gluten, eat more fruits and vegetables) or 20/20 lifestyles. Consider Shared Nutrition visit with Tiajuana Amass, [place].    6. Exercise: Gentle exercise, increase slowly to at least 30 minutes daily 3-5 days per week.    7. Anxiety/stress: Deep breathing and mindfulness techniques- consider "Calm" mindfulness APP on the phone, or learning biofeedback in the clinic from Dr. Herbert Deaner, Tiajuana Amass, [place] or [place], [place]. We use [name] program and devices which can be purchased for at home use as well. Explore [employer] Science of Well-Being 8 week course, 5 minutes a day.     8. Devices: Consider [name] and/or Gammacore.    9. Consider sleep visit with Mui Chan-Goh, ARNP    10. Fun: Increase fun time spend with friends and family    Benefits, side effects, and alternatives were discussed. She understands the recommendations above. She will follow up with our Headache ARNP/PA team within 1-2 months.    Tips to improve your healthcare:  Please fill out your RED CAP survey prior to each visit.   Refills: Call your pharmacy at least 7 working days before you run out. Please do not call the clinic for refills, it's quicker and safer to go through your pharmacy. We usually prescribe the medication for one year. If you continue to receive the prescription from [place], we require at least one yearly visit for refills.  Please schedule your appointment one month prior to running out.   Test Results: Available in 1-2 weeks on eCare. We will contact you sooner by telephone if there is something more urgent.   Urgent Symptoms: Call [phone], day or night, and select option 8. Our clinic staff will help you during regular hours; after hours, [place] on-call nurses will help you. If life-threatening, please call  [phone].     Other Questions: Use eCare to securely e-mail [place]. If you have a long or complex question or a new issue, please make an appointment, call [phone], day or night, and select option 8. To sign up for ecare call [phone] or ask your medical assistant to sign you up when in clinic.    Headache Clinic Policies:  - You need be seen at least once a year to continue safely receiving prescriptions from [place].  - We do not coordinate disability or social security, however we can provide our note to your primary care provider to help you coordinate the process.  - We can assist with family medical leave paperwork (FMLA); filling out the paperwork requires a face-to-face office visit.   - Schedule appointments ahead of time; we do not have urgent, same-day appointments available.  - We do not prescribe opioids or other controlled substances aside from Lyrica.  - To assure best access to care for all of our patients, we require at least 48 hours notice prior to cancelling appointments.

## 2018-01-19 ENCOUNTER — Encounter (HOSPITAL_BASED_OUTPATIENT_CLINIC_OR_DEPARTMENT_OTHER): Payer: Medicare PPO | Admitting: Neurology

## 2018-02-05 ENCOUNTER — Encounter (HOSPITAL_BASED_OUTPATIENT_CLINIC_OR_DEPARTMENT_OTHER): Payer: Self-pay | Admitting: Neurology

## 2018-02-08 ENCOUNTER — Ambulatory Visit (INDEPENDENT_AMBULATORY_CARE_PROVIDER_SITE_OTHER): Payer: Medicare PPO

## 2018-02-21 ENCOUNTER — Encounter (INDEPENDENT_AMBULATORY_CARE_PROVIDER_SITE_OTHER): Payer: Self-pay | Admitting: Nurse Practitioner

## 2018-02-21 ENCOUNTER — Encounter (INDEPENDENT_AMBULATORY_CARE_PROVIDER_SITE_OTHER): Payer: Self-pay | Admitting: Internal Medicine

## 2018-02-21 ENCOUNTER — Ambulatory Visit (INDEPENDENT_AMBULATORY_CARE_PROVIDER_SITE_OTHER): Payer: Medicare PPO | Admitting: Nurse Practitioner

## 2018-02-21 VITALS — BP 122/67 | HR 63 | Temp 98.0°F | Resp 14 | Wt 121.8 lb

## 2018-02-21 DIAGNOSIS — N3000 Acute cystitis without hematuria: Secondary | ICD-10-CM

## 2018-02-21 DIAGNOSIS — Z6823 Body mass index (BMI) 23.0-23.9, adult: Secondary | ICD-10-CM

## 2018-02-21 DIAGNOSIS — R399 Unspecified symptoms and signs involving the genitourinary system: Secondary | ICD-10-CM

## 2018-02-21 LAB — PR U/A AUTO W/MICRO, ONSITE
Bilirubin, Urine: NEGATIVE
Casts, URN: NEGATIVE /LPF
Crystals, URN: NEGATIVE /LPF
Epithelial Cells, URN: NEGATIVE /LPF
Glucose, Urine: NEGATIVE mg/dL
Ketones, URN: NEGATIVE mg/dL
Nitrite, URN: NEGATIVE
Protein: NEGATIVE mg/dL
Specific Gravity, Urine: 1.01 (ref 1.005–1.030)
Urobilinogen, URN: 0.2 E.U./dL (ref 0.2–1.0)
pH, URN: 5.5 (ref 5.0–8.0)

## 2018-02-21 MED ORDER — PHENAZOPYRIDINE HCL 200 MG OR TABS
200.0000 mg | ORAL_TABLET | Freq: Three times a day (TID) | ORAL | 0 refills | Status: AC
Start: 2018-02-21 — End: 2018-02-23

## 2018-02-21 MED ORDER — NITROFURANTOIN MONOHYD MACRO 100 MG OR CAPS
100.0000 mg | ORAL_CAPSULE | Freq: Two times a day (BID) | ORAL | 0 refills | Status: AC
Start: 2018-02-21 — End: 2018-02-28

## 2018-02-21 NOTE — Telephone Encounter (Signed)
Error

## 2018-02-21 NOTE — Progress Notes (Signed)
Chief Complaint   Patient presents with   . Urine Problem     urinary pain and frequency, started yesterday, no blood in urine     Primary care provider: Renelda Loma, MD  Subjective   Robin Hart is a 77 year old female with a history of overactive bladder, irritable bowel syndrome, pelvic floor dysfunction, and hypertension who presents on 02/21/2018 for the following concerns:    Concern for urinary tract infection  Robin Hart reports increased urinary frequency and urgency with dysuria since yesterday.  Symptoms are distinctly different from her historical symptoms of overactive bladder.  No abnormal vaginal discharge or bleeding.  Denies hematuria or flank pain.  Denies abdominal or pelvic pain.  She is not sexually active.  No fever or recent illness.   No recent UTI.   No recent antibiotics.  She reports chronic vaginal dryness, and has tried a compounded preparation on the recommendation of the gynecologist.  She found this preparation challenging and uncomfortable to apply, therefore she stopped using it.  She is interested in what other preparations are available.    ROS: per HPI    I reviewed the patient's recorded medical history, and confirmed the following: medications, problem list, allergies and past medical history    Patient Active Problem List   Diagnosis   . Hypertension   . Visceral hypersensitivity syndrome   . SI (sacroiliac) joint dysfunction   . Osteoporosis   . OAB (overactive bladder)   . Psychophysiological insomnia   . Pelvic floor dysfunction   . Chronic idiopathic constipation   . Nausea   . Functional dyspepsia   . Loss of transverse plantar arch   . Urge incontinence   . Spondylosis of lumbosacral region without myelopathy or radiculopathy   . Lumbosacral radiculopathy at L5   . Chronic bilateral low back pain with right-sided sciatica   . Somatic dysfunction of right lower extremity   . Nonallopathic lesion of lumbosacral region   . Degenerative spondylolisthesis   .  Stenosis of lateral recess of lumbar spine   . Foraminal stenosis of lumbar region   . Chronic pain of both shoulders   . Somatic dysfunction of upper extremity   . Migraine with aura and without status migrainosus, not intractable   . Hyperlipidemia   . Irritable bowel syndrome   . Migraine with vertigo   . Migraine without aura and without status migrainosus, not intractable   . Chronic daily headache       Current Outpatient Medications:   .  AMLODIPINE 2.5 MG Oral Tablet, TAKE 1 TABLET BY MOUTH DAILY, Disp: 90 tablet, Rfl: 1  .  chlorthalidone 25 MG Oral Tablet, Take 0.5 tablets (12.5 mg) by mouth daily. For high blood pressure., Disp: 45 tablet, Rfl: 3  .  diclofenac 1 % Transdermal Gel, Apply 2 g to affected area on hand(s) 4 times a day as needed (pain in thumbs from arthritis). (Patient not taking: Reported on 01/18/2018), Disp: 100 g, Rfl: 1  .  FISH OIL , , Disp: , Rfl:   .  Latanoprostene Bunod (VYZULTA) 0.024 % Ophthalmic Solution, , Disp: , Rfl:   .  LOSARTAN 100 MG Oral Tablet, TAKE 1 TABLET(100 MG) BY MOUTH DAILY, Disp: 90 tablet, Rfl: 1  .  Magnesium 400 MG Oral Tab, Take by mouth daily., Disp: , Rfl:   .  nitrofurantoin monohydrate macro 100 MG capsule, Take 1 capsule (100 mg) by mouth 2 times a day for 7 days.  Take until gone., Disp: 14 capsule, Rfl: 0  .  phenazopyridine (PYRIDIUM) 200 MG tablet, Take 1 tablet (200 mg) by mouth 3 times a day after meals for 2 days. For urinary discomfort., Disp: 6 tablet, Rfl: 0  .  ZIOPTAN 0.0015 % Ophthalmic Solution, , Disp: , Rfl:     Objective   Robin Hart presents to the clinic alone.   Vitals: BP 122/67   Pulse 63   Temp 98 F (36.7 C) (Tympanic)   Resp 14   Wt 121 lb 12.8 oz (55.2 kg)   SpO2 99%   Breastfeeding? No   BMI 23.03 kg/m     Physical Exam   Constitutional: She is oriented to person, place, and time and well-developed, well-nourished, and in no distress.   HENT:   Mouth/Throat: Oropharynx is clear and moist.   Cardiovascular: Normal rate  and regular rhythm.   Pulmonary/Chest: Effort normal.   Abdominal: Soft. There is no tenderness. There is no CVA tenderness.   Neurological: She is alert and oriented to person, place, and time. Gait normal.   Skin: Skin is warm and dry.   Psychiatric: Mood and affect normal.      Assessment & Plan   Robin Hart is a 77 year old female with:    Acute cystitis without hematuria    Robin Hart presents with urinary frequency, urgency, and dysuria since yesterday.  She has a history of overactive bladder, though these symptoms are unusual for her.  Her new onset of symptoms and urinalysis suggest urinary tract infection and she is interested in antibiotic treatment.  We reviewed asymptomatic bacteriuria versus acute urinary tract infection, and given her symptoms and exam findings I believe treatment is reasonable.  She does not have signs or symptoms of pyelonephritis including fever, flank pain, CVA tenderness or hematuria.  Recommending empiric treatment with nitrofurantoin, following with culture to ensure appropriate coverage.  Pyridium for dysuria.  Patient verbalized return precautions.  For questions regarding vaginal dryness, that may be contributory to urinary tract infections, I will consult with my colleagues in the clinic and provide information in the AVS with recommendations for follow-up discussion.  Patient agrees with this plan.    - nitrofurantoin monohydrate macro 100 MG capsule; Take 1 capsule (100 mg) by mouth 2 times a day for 7 days. Take until gone.  Dispense: 14 capsule; Refill: 0  - phenazopyridine (PYRIDIUM) 200 MG tablet; Take 1 tablet (200 mg) by mouth 3 times a day after meals for 2 days. For urinary discomfort.  Dispense: 6 tablet; Refill: 0  - U/A AUTO DIPSTICK ONLY, ONSITE; Future  - U/A AUTO W/MICRO, ONSITE  - Urine Culture      Advised to return to clinic if symptoms increase, persist, or worsen. Diagnosis, treatments, risks and alternatives discussed with patient and  questions answered.      Fayne Mediate, ARNP  Nurse Practitioner, [facility]

## 2018-02-21 NOTE — Result Encounter Note (Signed)
Macrobid started 11/20; culture to follow.

## 2018-02-21 NOTE — Patient Instructions (Addendum)
My recommendations for you:    Please take the antibiotic, nitrofurantoin, for urinary tract infection. Increase hydration.     Regarding your question about treating vaginal dryness:  Coconut oil is an inexpensive option. Dr. Leory Plowman, who I spoke with today, suggests looking into the cost of estrogen preparation options including an estrogen ring, Vagifem, estrace cream, or premarin cream. Please discuss these options further with Dr. Rachel Moulds if you are interested.       Fayne Mediate, ARNP  ____________________________________________________________________    Please contact me through eCare or at the [facility] clinic [phone]) if you have any questions.

## 2018-02-21 NOTE — Progress Notes (Signed)
Visit: UTI    Refills? NO  Referral? NO  Letter or Form? NO  Lab Results? NO    HEALTH MAINTENANCE:  Has the patient has this done since their last visit?  Cervical screening/PAP: N/A  Mammo: UTD  Colon Screen: N/A  Diabetic Eye Exam (If applicable): N/A    Have you seen a specialist since your last visit: No    Vaccines Due? Yes, zoster and flu    Does patient have eCare?  yes    HM Due:   Health Maintenance   Topic Date Due    OT Plan of Care  10/25/1940    Zoster Vaccine (2 of 3) 05/30/2012    Influenza Vaccine (1) 01/02/2018    Osteoporosis Screening  09/08/2018    Depression Screening (PHQ-2)  12/16/2018    Breast Cancer Screening  06/13/2019    Lipid Disorders Screening  08/05/2020    Tetanus Vaccine  04/16/2022    Pneumococcal Vaccine: 65+ years  Completed    Pneumococcal Vaccine: Pediatrics (0-5 years) and At-Risk Patients (6-64 years)  Aged Out       PCP Verified?  Yes, Volkman, Mathews Robinsons, MD

## 2018-02-23 ENCOUNTER — Telehealth (INDEPENDENT_AMBULATORY_CARE_PROVIDER_SITE_OTHER): Payer: Self-pay | Admitting: Nurse Practitioner

## 2018-02-23 LAB — URINE C/S: Culture: NO GROWTH

## 2018-02-23 NOTE — Telephone Encounter (Addendum)
Pt informed of message below. Pt expressed understanding. no appointment needed at this time.  Closing

## 2018-02-23 NOTE — Telephone Encounter (Signed)
   Please call the patient.   Notify her that her urine culture results are negative, no growth of bacteria.   She can stop taking the antibiotic, nitrofurantoin, and I prescribed for urinary tract infection.   If she remains symptomatic, please facilitate a follow-up appointment.    Thank you,   Marylyn Ishihara

## 2018-02-24 ENCOUNTER — Other Ambulatory Visit: Payer: Self-pay

## 2018-02-26 ENCOUNTER — Telehealth (INDEPENDENT_AMBULATORY_CARE_PROVIDER_SITE_OTHER): Payer: Self-pay | Admitting: Nurse Practitioner

## 2018-02-26 ENCOUNTER — Ambulatory Visit (INDEPENDENT_AMBULATORY_CARE_PROVIDER_SITE_OTHER): Payer: Self-pay | Admitting: Internal Medicine

## 2018-02-26 DIAGNOSIS — R300 Dysuria: Secondary | ICD-10-CM

## 2018-02-26 DIAGNOSIS — R8290 Unspecified abnormal findings in urine: Secondary | ICD-10-CM

## 2018-02-26 MED ORDER — PHENAZOPYRIDINE HCL 100 MG OR TABS
100.0000 mg | ORAL_TABLET | Freq: Three times a day (TID) | ORAL | 0 refills | Status: AC | PRN
Start: 2018-02-26 — End: 2018-02-28

## 2018-02-26 NOTE — Telephone Encounter (Signed)
Order for pyridium signed.     I recommend follow up with PCP to explore differential as this pain does not appear related to UTI.     Please facilitate appointment with Dr.Volkman.

## 2018-02-26 NOTE — Telephone Encounter (Signed)
"  I was seen in the clinic on 11/20 for some urgency, frequency and painful urination "'I was on the antibiotic for two das and I received a call on 11/22 that  I don't have a UTI and to stop taking it" "this morning I began to have frequency, urgency and it's really painful at the end of urination" "this morning I have also noticed blood in it" "I had been taking that medication that changes your urine to a strange color"      Reason for Disposition  . Age > 50 years    Additional Information  . Negative: Shock suspected (e.g., cold/pale/clammy skin, too weak to stand)  . Negative: Sounds like a life-threatening emergency to the triager  . Negative: Unable to urinate (or only a few drops) and bladder feels very full  . Negative: Patient sounds very sick or weak to the triager  . Negative: Severe pain with urination  . Negative: Fever > 100.5 F (38.1 C)  . Negative: Side (flank) or lower back pain present  . Negative: Taking antibiotic > 24 hours for UTI and fever persists  . Negative: Taking antibiotic > 3 days for UTI and painful urination not improved  . Negative: Unusual vaginal discharge  . Negative: > 2 UTIs in last year  . Negative: Patient is worried about sexually transmitted disease (STD)    Protocols used: URINATION PAIN - FEMALE-ADULT-[place]

## 2018-02-26 NOTE — Telephone Encounter (Signed)
"  I was seen in the clinic on 11/20 for some urgency, frequency and painful urination "'I was on the antibiotic for two das and I received a call on 11/22 that  I don't have a UTI and to stop taking it" "this morning I began to have frequency, urgency and it's really painful at the end of urination" "this morning I have also noticed blood in it" "I had been taking that medication that changes your urine to a strange color"    Declined Disposition at this time to be seen toda  Patient requesting instructions from Provider  "Should I resume taking the antibiotic?    Reason for Disposition  . Age > 50 years    Additional Information  . Negative: Shock suspected (e.g., cold/pale/clammy skin, too weak to stand)  . Negative: Sounds like a life-threatening emergency to the triager  . Negative: Unable to urinate (or only a few drops) and bladder feels very full  . Negative: Patient sounds very sick or weak to the triager  . Negative: Severe pain with urination  . Negative: Fever > 100.5 F (38.1 C)  . Negative: Side (flank) or lower back pain present  . Negative: Taking antibiotic > 24 hours for UTI and fever persists  . Negative: Taking antibiotic > 3 days for UTI and painful urination not improved  . Negative: Unusual vaginal discharge  . Negative: > 2 UTIs in last year  . Negative: Patient is worried about sexually transmitted disease (STD)    Protocols used: URINATION PAIN - FEMALE-ADULT-[place]

## 2018-02-26 NOTE — Telephone Encounter (Signed)
RETURN CALL: Voicemail - Detailed Message      SUBJECT:  General Message     MESSAGE: Patient was seen for UTI symptoms on 02/21/18 and prescribed antibiotics.  She was advised 02/23/18 that there was no UTI and to stop taking them.  Her symptoms had lessened but now they are back to what they were and there is blood in the urine.  Please call to discuss options

## 2018-02-26 NOTE — Telephone Encounter (Signed)
I do not recommend restarting prior antibiotics without further information.   I have placed repeat urine studies that she can come in and leave a sample.   I would broaden our thinking beyond URINARY TRACT INFECTION to atrophic vaginits, kidney stone, bladder stone etc.   I would offer an appointment here or a referral to urology, if she prefers.

## 2018-02-26 NOTE — Telephone Encounter (Signed)
Pt informed of message below and agrees to plan     Pt would like to request the pain med for UTI that Marylyn Ishihara had provided last time, pt states that at least for the pain until the sample comes back     Pt wanted to also schedule an appointment with PCP to discuss frequent UTI sx, pt was transferred to the [facility] to schedule     Pharmacy pended for your consideration

## 2018-02-26 NOTE — Telephone Encounter (Signed)
Patient called back waiting to see what Dr Rachel Moulds wants her to do. Patient wants to know if she should start taking the antibiotics today. Patient is requesting a phone call. Patient states she had a similar symptom at a former appointment and patient is concerned. Patient states there was blood in her urine this morning.

## 2018-02-27 ENCOUNTER — Other Ambulatory Visit (INDEPENDENT_AMBULATORY_CARE_PROVIDER_SITE_OTHER): Payer: Medicare PPO

## 2018-02-27 ENCOUNTER — Encounter (HOSPITAL_BASED_OUTPATIENT_CLINIC_OR_DEPARTMENT_OTHER): Payer: Medicare PPO | Admitting: Pediatrics

## 2018-02-27 NOTE — Telephone Encounter (Signed)
Patient advised     Agrees

## 2018-03-04 NOTE — Progress Notes (Signed)
Robin Hart is a 77 year old female who presents today for urinary problems.      Assessment and Plan:    1. Urine abnormality, 2. Atrophic vaginitis  3. Dysuria  4. OAB (overactive bladder)  5. Hematuria, unspecified type  SHe has had a couple of sterile cultures. Blood on the last U/A, but not prior. ddx includes atrophy, stone, less likely polyp/mass.   She would like to restart estrogen while awaiting another urogyn consult, and this seems reasonable.  - REFERRAL TO OB/GYN  - U/A AUTO DIPSTICK ONLY, ONSITE  - REFERRAL TO OB/GYN  - estradiol 0.1 MG/GM vaginal cream; Place 2 g into the vagina at bedtime for 14 days, THEN 2 g 2 times a week.  Dispense: 44 g; Refill: 0      6. Cough in adult  She can try this for symptom relief. She will let me know if it is useful.  - benzonatate 100 MG capsule; Take 1 capsule (100 mg) by mouth 3 times a day as needed for cough.  Dispense: 30 capsule; Refill: 2    7. Primary insomnia  She will try this to extend her sleep hours.   - traZODone 50 MG tablet; Take 1-2 tablets (50-100 mg) by mouth at bedtime as needed for sleep. For Insomnia.  Dispense: 90 tablet; Refill: 1    ---          Subjective:     History of Present Illness:       She continues to have urinary issues. A burning sensation and even worse urge. This is on top of her chronic OAB.   She was seen for possible URINARY TRACT INFECTION but asked to stop antibiotic after a negative culture. She had one particularly severe session that she saw blood and jumped off the toilet it was so painful, since things have settled down and are just mildly uncomfortable.   She's had no prior history of kidney stones.    Years ago she saw a urogynecologist rx'ed topical estrogen when she first moved to [place]. This consult was for OAB but part of the impression was for atrophy. The prescription was filled at compound pharmacy, not sure why. It was to apply about 1 gram nightly. Probably still has it somewhere but it would be  77 years old.   She wears a pad 24/7 and sometimes can't 'get her pants down fast enough' .     She's done studies of the sacral nerve stimulator. She hasn't tried botox yet.     2nd problem: Also has another cold, already this fall. She went to the [place] and came down with it. They take a very long time to recover.    3rd problem: Trouble sleeping. Went to bed at 11pm, no trouble falling asleep, woke up at 3am. Most nights she's up after about 4 hrs. Sometimes she'll go back to sleep 5-6am sometimes, but not always.   She tried behavioral modification without success. She tried melatonin. Wonders what else to do.    Review of Systems   Constitution: Negative.       Objective:     PE:    Vitals:    03/05/18 0858   BP: 121/79   BP Cuff Size: Regular   BP Site: Left Arm   BP Position: Sitting   Pulse: 66   Resp: 16   Temp: 97.7 F (36.5 C)   TempSrc: Temporal   SpO2: 99%   Weight: 122 lb  3.2 oz (55.4 kg)     There is no height or weight on file to calculate BMI.    Physical Exam   Constitutional: She is oriented to person, place, and time. She appears well-developed and well-nourished. No distress.   Eyes: Conjunctivae are normal.   Pulmonary/Chest: Effort normal.   Genitourinary:    Genitourinary Comments: deferred     Neurological: She is alert and oriented to person, place, and time.   Psychiatric: She has a normal mood and affect. Her behavior is normal.

## 2018-03-05 ENCOUNTER — Encounter (INDEPENDENT_AMBULATORY_CARE_PROVIDER_SITE_OTHER): Payer: Self-pay | Admitting: Internal Medicine

## 2018-03-05 ENCOUNTER — Ambulatory Visit (INDEPENDENT_AMBULATORY_CARE_PROVIDER_SITE_OTHER): Payer: Medicare PPO | Admitting: Internal Medicine

## 2018-03-05 VITALS — BP 121/79 | HR 66 | Temp 97.7°F | Resp 16 | Wt 122.2 lb

## 2018-03-05 DIAGNOSIS — R8290 Unspecified abnormal findings in urine: Secondary | ICD-10-CM

## 2018-03-05 DIAGNOSIS — R059 Cough, unspecified: Secondary | ICD-10-CM

## 2018-03-05 DIAGNOSIS — R05 Cough: Secondary | ICD-10-CM

## 2018-03-05 DIAGNOSIS — N952 Postmenopausal atrophic vaginitis: Secondary | ICD-10-CM

## 2018-03-05 DIAGNOSIS — Z6823 Body mass index (BMI) 23.0-23.9, adult: Secondary | ICD-10-CM

## 2018-03-05 DIAGNOSIS — F5101 Primary insomnia: Secondary | ICD-10-CM

## 2018-03-05 DIAGNOSIS — R319 Hematuria, unspecified: Secondary | ICD-10-CM

## 2018-03-05 DIAGNOSIS — N3281 Overactive bladder: Secondary | ICD-10-CM

## 2018-03-05 DIAGNOSIS — R300 Dysuria: Secondary | ICD-10-CM

## 2018-03-05 LAB — PR U/A AUTO DIPSTICK ONLY, ONSITE
Bilirubin, Urine: NEGATIVE
Glucose, Urine: NEGATIVE mg/dL
Ketones, URN: NEGATIVE mg/dL
Nitrite, URN: NEGATIVE
Protein: NEGATIVE mg/dL
Specific Gravity, Urine: 1.02 (ref 1.005–1.030)
Urobilinogen, URN: 0.2 E.U./dL (ref 0.2–1.0)
pH, URN: 6 (ref 5.0–8.0)

## 2018-03-05 MED ORDER — BENZONATATE 100 MG OR CAPS
100.0000 mg | ORAL_CAPSULE | Freq: Three times a day (TID) | ORAL | 2 refills | Status: DC | PRN
Start: 2018-03-05 — End: 2018-03-21

## 2018-03-05 MED ORDER — ESTRADIOL 0.1 MG/GM VA CREA
TOPICAL_CREAM | VAGINAL | 0 refills | Status: DC
Start: 2018-03-05 — End: 2018-08-09

## 2018-03-05 MED ORDER — TRAZODONE HCL 50 MG OR TABS
50.0000 mg | ORAL_TABLET | Freq: Every evening | ORAL | 1 refills | Status: DC | PRN
Start: 2018-03-05 — End: 2018-03-21

## 2018-03-05 NOTE — Progress Notes (Signed)
Visit:   Chief Complaint   Patient presents with   . Urine Problem     Patient is here today to follow up about UTI.   Marland Kitchen URI     Patient states she has chronic cough and upper respiratory infections   . Sleep Problem     Patient complains of continuous sleep problems         Refills? NO  Referral? NO  Letter or Form? NO  Lab Results? NO    HEALTH MAINTENANCE:  Has the patient has this done since their last visit?  Cervical screening/PAP: N/A  Mammo: N/A  Colon Screen: N/A  Diabetic Eye Exam (If applicable): N/A    Have you seen a specialist since your last visit: No    Vaccines Due? Yes, zoster    Does patient have eCare?  yes    HM Due:   Health Maintenance   Topic Date Due   . Zoster Vaccine (2 of 3) 05/30/2012   . Osteoporosis Screening  09/08/2018   . Depression Screening (PHQ-2)  12/16/2018   . Breast Cancer Screening  06/13/2019   . Lipid Disorders Screening  08/05/2020   . Tetanus Vaccine  04/16/2022   . Pneumococcal Vaccine: 65+ years  Completed   . Influenza Vaccine  Completed   . Pneumococcal Vaccine: Pediatrics (0-5 years) and At-Risk Patients (6-64 years)  Aged Out       PCP Verified?  Yes, Volkman, Mathews Robinsons, MD

## 2018-03-05 NOTE — Patient Instructions (Addendum)
Suzette Sutherland--very procedural    [facility] [place]/private practice    Vitamin C, zinc, Lots of hand sanitizer, lots of sleep      It was a pleasure to see you in clinic today. Your Medical Assistant was: Psychiatrist, [place].          You can schedule an appointment to see [place] by calling  [phone] or via eCare.     If labs were ordered today the results are expected to be available via eCare 5 days later. Otherwise, result letters are mailed 7-10 days after your tests are completed. If your physician needs to change your care based on your results, you will receive a phone call to notify you. If you haven't heard from him/her and it has been more than 10 days please give [place] a call.     You may receive a survey in the mail asking how your experience has been with [facility]  Your input and opinions are very important to [place].  I hope you are able to take the time to complete this survey.    Thank you for choosing [facility].

## 2018-03-07 ENCOUNTER — Encounter (INDEPENDENT_AMBULATORY_CARE_PROVIDER_SITE_OTHER): Payer: Self-pay | Admitting: Internal Medicine

## 2018-03-07 NOTE — Telephone Encounter (Signed)
Please see eCare message and advise

## 2018-03-08 ENCOUNTER — Encounter (HOSPITAL_BASED_OUTPATIENT_CLINIC_OR_DEPARTMENT_OTHER): Payer: Self-pay

## 2018-03-13 ENCOUNTER — Encounter (HOSPITAL_BASED_OUTPATIENT_CLINIC_OR_DEPARTMENT_OTHER): Payer: Medicare PPO | Admitting: Pediatrics

## 2018-03-20 ENCOUNTER — Encounter (HOSPITAL_BASED_OUTPATIENT_CLINIC_OR_DEPARTMENT_OTHER): Payer: Medicare PPO | Admitting: Family

## 2018-03-21 ENCOUNTER — Ambulatory Visit (INDEPENDENT_AMBULATORY_CARE_PROVIDER_SITE_OTHER): Payer: Medicare PPO | Admitting: Family

## 2018-03-21 ENCOUNTER — Encounter (INDEPENDENT_AMBULATORY_CARE_PROVIDER_SITE_OTHER): Payer: Self-pay | Admitting: Family

## 2018-03-21 VITALS — BP 122/50 | HR 64 | Temp 98.4°F | Resp 18 | Wt 120.8 lb

## 2018-03-21 DIAGNOSIS — R059 Cough, unspecified: Secondary | ICD-10-CM

## 2018-03-21 DIAGNOSIS — R05 Cough: Secondary | ICD-10-CM

## 2018-03-21 DIAGNOSIS — Z6822 Body mass index (BMI) 22.0-22.9, adult: Secondary | ICD-10-CM

## 2018-03-21 MED ORDER — FLUTICASONE PROPIONATE 50 MCG/ACT NA SUSP
2.0000 | Freq: Every day | NASAL | 3 refills | Status: DC
Start: 2018-03-21 — End: 2018-11-26

## 2018-03-21 MED ORDER — ALBUTEROL SULFATE HFA 108 (90 BASE) MCG/ACT IN AERS
2.0000 | INHALATION_SPRAY | RESPIRATORY_TRACT | 2 refills | Status: DC | PRN
Start: 2018-03-21 — End: 2018-11-26

## 2018-03-21 MED ORDER — AZITHROMYCIN 250 MG OR TABS
ORAL_TABLET | ORAL | 0 refills | Status: DC
Start: 2018-03-21 — End: 2018-11-26

## 2018-03-21 NOTE — Patient Instructions (Signed)
Patient Education     Acute Bronchitis  Your healthcare provider has told you that you have acute bronchitis. Bronchitis is infection or inflammation of the bronchial tubes (airways in the lungs). Normally, air moves easily in and out of the airways. Bronchitis narrows the airways, making it harder for air to flow in and out of the lungs. This causes symptoms such as shortness of breath, coughing up yellow or green mucus, and wheezing. Bronchitis can be acute or chronic. Acute means the condition comes on quickly and goes away in a short time, usually within 3 to 10 days. Chronic means a condition lasts a long time and often comes back.    What causes acute bronchitis?  Acute bronchitis almost always starts as a viral respiratory infection, such as a cold or the flu. Certain factors make it more likely for a cold or flu to turn into bronchitis. These include being very young, being elderly, having a heart or lung problem, or having a weak immune system. Cigarette smoking also makes bronchitis more likely.  When bronchitis develops, the airways become swollen. The airways may also become infected with bacteria. This is known as a secondary infection.  Diagnosing acute bronchitis  Your healthcare provider will examine you and ask about your symptoms and health history. You may also have a sputum culture to test the fluid in your lungs. Chest X-rays may be done to look for infection in the lungs.  Treating acute bronchitis  Bronchitis usually clears up as the cold or flu goes away. You can help feel better faster by doing the following:   Take medicine as directed. You may be told to take ibuprofen or other over-the-counter medicines. These help relieve inflammation in your bronchial tubes. Your healthcare provider may prescribe an inhaler to help open up the bronchial tubes. Most of the time,acute bronchitisis caused by a viral infection. Antibiotics are usually not prescribed for viral infections.   Drink plenty  of fluids, such as water, juice, or warm soup. Fluids loosen mucus so that you can cough it up. This helps you breathe more easily. Fluids also prevent dehydration.   Make sure you get plenty of rest.   Do not smoke. Do not allow anyone else to smoke in your home.  Recovery and follow-up  Follow up with your doctor as you are told. You will likely feel better in a week or two. But a dry cough can linger beyond that time. Let your doctor know if you still have symptoms (other than a dry cough) after 2 weeks, or if you're prone to getting bronchial infections. Take steps to protect yourself from future infections. These steps include stopping smoking and avoiding tobacco smoke, washing your hands often, and getting a yearly flu shot.  When to call your healthcare provider  Call the healthcare provider if you have any of the following:   Fever of 100.4F (38.0C) or higher, or as advised   Symptoms that get worse, or new symptoms   Trouble breathing   Symptoms that don't start to improve within a week, or within 3 days of taking antibiotics   Date Last Reviewed: 03/05/2015   2000-2018 [employer] [address], [place] [address]. All rights reserved. This information is not intended as a substitute for professional medical care. Always follow your healthcare professional's instructions.

## 2018-03-21 NOTE — Progress Notes (Signed)
Chief Complaint   Patient presents with   . Cough     productive cough with clear to yellowish phlegm, runny nose, sore throat, body aches, fatigue since mid-October.        Subjective:     ID: Eleana Tocco is a 77 year old female who presents to [facility] on 03/21/2018 for the following concerns:    History of Present Illness:     Lisbeth is a 77 year old female presents with cough 2 months.  She was given benzonatate on December 2 for cough, and has not helped.  She reports cough has been relatively constant since mid-October.  Cough is semi-productive, clear/yellow sputum, worse in night and when she lays down flat.  She reports associated sore throat, which she has been using Chloraseptic spray and warm tea with honey.  She denies sinus pressure or pain.  She does report bilateral ear pressure.  Multiple sick contacts in grandchildren.  She denies fever, nausea, vomiting, or abdominal pain or burning.    I reviewed the patient's recorded medical history, and confirmed the following: medications, problem list, allergies and past medical history    Patient's Medications   New Prescriptions    ALBUTEROL HFA (PROAIR HFA) 108 (90 BASE) MCG/ACT INHALER    Inhale 2 puffs by mouth every 4 hours as needed for shortness of breath/wheezing. For Asthma. May use every 2 hours for attacks, or preventatively before exercise.    AZITHROMYCIN 250 MG TABLET    Take 2 tablets today, then take 1 tablet every day until gone.    FLUTICASONE PROPIONATE 50 MCG/ACT NASAL SPRAY    Spray 2 sprays into each nostril daily.   Previous Medications    AMLODIPINE 2.5 MG ORAL TABLET    TAKE 1 TABLET BY MOUTH DAILY    CHLORTHALIDONE 25 MG ORAL TABLET    Take 0.5 tablets (12.5 mg) by mouth daily. For high blood pressure.    ESTRADIOL 0.1 MG/GM VAGINAL CREAM    Place 2 g into the vagina at bedtime for 14 days, THEN 2 g 2 times a week.    LOSARTAN 100 MG ORAL TABLET    TAKE 1 TABLET(100 MG) BY MOUTH DAILY    ZIOPTAN 0.0015 % OPHTHALMIC SOLUTION        Modified Medications    No medications on file   Discontinued Medications    BENZONATATE 100 MG CAPSULE    Take 1 capsule (100 mg) by mouth 3 times a day as needed for cough.    FISH OIL         LATANOPROSTENE BUNOD (VYZULTA) 0.024 % OPHTHALMIC SOLUTION        MAGNESIUM 400 MG ORAL TAB    Take by mouth daily.    TRAZODONE 50 MG TABLET    Take 1-2 tablets (50-100 mg) by mouth at bedtime as needed for sleep. For Insomnia.        Outpatient Medications Marked as Taking for the 03/21/18 encounter (Office Visit) with Rhae Hammock, ARNP   Medication Sig Dispense Refill   . AMLODIPINE 2.5 MG Oral Tablet TAKE 1 TABLET BY MOUTH DAILY 90 tablet 1   . chlorthalidone 25 MG Oral Tablet Take 0.5 tablets (12.5 mg) by mouth daily. For high blood pressure. 45 tablet 3   . estradiol 0.1 MG/GM vaginal cream Place 2 g into the vagina at bedtime for 14 days, THEN 2 g 2 times a week. 44 g 0   . LOSARTAN 100 MG Oral Tablet TAKE  1 TABLET(100 MG) BY MOUTH DAILY 90 tablet 1   . ZIOPTAN 0.0015 % Ophthalmic Solution          Review of patient's allergies indicates:  Allergies   Allergen Reactions   . Adhesives Itching       Review of Systems   Constitutional: Positive for chills. Negative for activity change, fatigue and fever.   Eyes: Negative.    Respiratory: Positive for cough. Negative for chest tightness, shortness of breath and wheezing.    Cardiovascular: Positive for leg swelling (Chronic issue, unchanged over the last 3 months). Negative for chest pain and palpitations.   Gastrointestinal: Negative.    Genitourinary: Negative.    Skin: Negative.    Neurological: Negative.        Past Medical History:   Diagnosis Date   . Allergic rhinitis due to allergen    . Arthritis    . Cancer ([place])     skin cell carcinoma    . Constipation    . Diverticulosis    . Glaucoma    . H. pylori infection    . Headache    . History of diarrhea    . History of irritable bowel syndrome    . Hypertension    . Lipidemia    . Osteoporosis    .  Primary insomnia      Past Surgical History:   Procedure Laterality Date   . Basal cell carcinoma excision     . BLADDER SUSPENSION     . BREAST BIOPSY     . COLONOSCOPY STOMA DX INCLUDING COLLJ SPEC SPX     . TOTAL ABDOMINAL HYSTERECT W/WO RMVL TUBE OVARY       Social History     Tobacco Use   . Smoking status: Never Smoker   . Smokeless tobacco: Never Used   Substance Use Topics   . Alcohol use: Yes     Frequency: Monthly or less     Drinks per session: 1 or 2     Comment: occasionally   . Drug use: No     Family History     Problem (# of Occurrences) Relation (Name,Age of Onset)    Breast Cancer (2) Sister, Maternal Aunt    Cancer (1) Mother (67): Throat cancer    Diabetes (1) Daughter: TYPE I    GI (1) Mother (88): IBS    Hypertension (1) Sister    Myocardial Infarction (1) Brother (6)    No Known Problems (1) Father (40's)       Negative family history of: Migraine Headaches          Objective:     BP 122/50   Pulse 64   Temp 98.4 F (36.9 C) (Temporal)   Resp 18   Wt 120 lb 12.8 oz (54.8 kg)   SpO2 99%   BMI 22.84 kg/m     Physical Exam   Constitutional: She is well-developed, well-nourished, and in no distress.   HENT:   Head: Normocephalic and atraumatic.   Right Ear: Tympanic membrane, external ear and ear canal normal.   Left Ear: Tympanic membrane, external ear and ear canal normal.   Nose: Mucosal edema and rhinorrhea present.   Mouth/Throat: Uvula is midline and mucous membranes are normal. Posterior oropharyngeal erythema present. No oropharyngeal exudate or posterior oropharyngeal edema.   Cardiovascular: Normal rate, regular rhythm, normal heart sounds and intact distal pulses. Exam reveals no gallop.   Pulmonary/Chest: Effort normal and breath sounds  normal. No respiratory distress. She has no wheezes. She has no rales. She exhibits no tenderness.   Vitals reviewed.        Assessment and Plan:   1. Cough  Ddx: Chronic postnasal drip, reactive airway, bronchitis  Patient will attempt 4-5  days of nasal fluticasone and albuterol.  If no improvement will take azithromycin as prescribed.    - fluticasone propionate 50 MCG/ACT nasal spray; Spray 2 sprays into each nostril daily.  Dispense: 18.2 mL; Refill: 3  - albuterol HFA (ProAir HFA) 108 (90 Base) MCG/ACT inhaler; Inhale 2 puffs by mouth every 4 hours as needed for shortness of breath/wheezing. For Asthma. May use every 2 hours for attacks, or preventatively before exercise.  Dispense: 1 Inhaler; Refill: 2  - azithromycin 250 MG tablet; Take 2 tablets today, then take 1 tablet every day until gone.  Dispense: 6 tablet; Refill: 0      Follow Up: Follow up with Primary Care Provider as discussed for any worsening or ongoing symptoms      Chane verbalized understanding and agreement of plan. Questions answered.    Rhae Hammock, ARNP  [facility]  [place] [address]  [phone]

## 2018-04-19 ENCOUNTER — Other Ambulatory Visit (INDEPENDENT_AMBULATORY_CARE_PROVIDER_SITE_OTHER): Payer: Self-pay | Admitting: Internal Medicine

## 2018-04-19 DIAGNOSIS — I10 Essential (primary) hypertension: Secondary | ICD-10-CM

## 2018-04-19 MED ORDER — AMLODIPINE BESYLATE 2.5 MG OR TABS
ORAL_TABLET | ORAL | 1 refills | Status: DC
Start: 2018-04-19 — End: 2018-12-27

## 2018-05-22 ENCOUNTER — Encounter (HOSPITAL_BASED_OUTPATIENT_CLINIC_OR_DEPARTMENT_OTHER): Payer: Medicare PPO | Admitting: Obstetrics/Gynecology

## 2018-05-25 ENCOUNTER — Encounter (HOSPITAL_BASED_OUTPATIENT_CLINIC_OR_DEPARTMENT_OTHER): Payer: Self-pay | Admitting: Internal Medicine

## 2018-05-25 DIAGNOSIS — Z1231 Encounter for screening mammogram for malignant neoplasm of breast: Secondary | ICD-10-CM

## 2018-06-05 ENCOUNTER — Other Ambulatory Visit (INDEPENDENT_AMBULATORY_CARE_PROVIDER_SITE_OTHER): Payer: Self-pay | Admitting: Otolaryngology

## 2018-06-05 DIAGNOSIS — H6063 Unspecified chronic otitis externa, bilateral: Secondary | ICD-10-CM

## 2018-06-18 ENCOUNTER — Encounter (INDEPENDENT_AMBULATORY_CARE_PROVIDER_SITE_OTHER): Payer: Self-pay | Admitting: Internal Medicine

## 2018-06-18 NOTE — Telephone Encounter (Signed)
Please see ecare message.

## 2018-06-19 ENCOUNTER — Encounter (INDEPENDENT_AMBULATORY_CARE_PROVIDER_SITE_OTHER): Payer: Self-pay | Admitting: Internal Medicine

## 2018-06-19 ENCOUNTER — Encounter (HOSPITAL_BASED_OUTPATIENT_CLINIC_OR_DEPARTMENT_OTHER): Payer: Medicare PPO | Admitting: Obstetrics/Gynecology

## 2018-06-19 NOTE — Telephone Encounter (Signed)
FYI only Dr. Rachel Moulds about blood pressure medication.

## 2018-06-29 ENCOUNTER — Other Ambulatory Visit (INDEPENDENT_AMBULATORY_CARE_PROVIDER_SITE_OTHER): Payer: Self-pay | Admitting: Internal Medicine

## 2018-06-29 DIAGNOSIS — I10 Essential (primary) hypertension: Secondary | ICD-10-CM

## 2018-06-29 MED ORDER — LOSARTAN POTASSIUM 100 MG OR TABS
ORAL_TABLET | ORAL | 1 refills | Status: DC
Start: 2018-06-29 — End: 2018-12-27

## 2018-06-29 NOTE — Telephone Encounter (Signed)
Refill request received for losartan 100mg  has been approved. However last BMP was 05/2017 .  Forward to PCP to review.

## 2018-07-02 ENCOUNTER — Ambulatory Visit (HOSPITAL_BASED_OUTPATIENT_CLINIC_OR_DEPARTMENT_OTHER): Payer: Medicare PPO

## 2018-08-09 ENCOUNTER — Other Ambulatory Visit (INDEPENDENT_AMBULATORY_CARE_PROVIDER_SITE_OTHER): Payer: Self-pay | Admitting: Internal Medicine

## 2018-08-09 DIAGNOSIS — N952 Postmenopausal atrophic vaginitis: Secondary | ICD-10-CM

## 2018-08-10 MED ORDER — ESTRADIOL 0.1 MG/GM VA CREA
TOPICAL_CREAM | VAGINAL | 1 refills | Status: DC
Start: 2018-08-09 — End: 2019-03-18

## 2018-08-24 ENCOUNTER — Telehealth (INDEPENDENT_AMBULATORY_CARE_PROVIDER_SITE_OTHER): Payer: Self-pay | Admitting: Orthopaedic Surgery

## 2018-08-24 DIAGNOSIS — M5136 Other intervertebral disc degeneration, lumbar region: Secondary | ICD-10-CM

## 2018-08-24 DIAGNOSIS — M25511 Pain in right shoulder: Secondary | ICD-10-CM

## 2018-08-24 DIAGNOSIS — M48061 Spinal stenosis, lumbar region without neurogenic claudication: Secondary | ICD-10-CM

## 2018-08-24 DIAGNOSIS — M47817 Spondylosis without myelopathy or radiculopathy, lumbosacral region: Secondary | ICD-10-CM

## 2018-08-24 DIAGNOSIS — G8929 Other chronic pain: Secondary | ICD-10-CM

## 2018-08-24 DIAGNOSIS — M5417 Radiculopathy, lumbosacral region: Secondary | ICD-10-CM

## 2018-08-24 DIAGNOSIS — M9907 Segmental and somatic dysfunction of upper extremity: Secondary | ICD-10-CM

## 2018-08-24 NOTE — Telephone Encounter (Signed)
Referral Request:    Reason for referral:   Physical Therapy-Hip/Shoulder Pain   Clinic name/physician if applicable:   [facility] contact information:  [address]  [place], [place]  [phone]   Return Call: Detailed message on voicemail

## 2018-08-28 NOTE — Telephone Encounter (Signed)
Patient's physical therapy office called. Patient is there right now having her appointment. They need a new referral faxed to them for the patient's back and shoulder.     Please fax to:  [phone]

## 2018-08-28 NOTE — Telephone Encounter (Signed)
Pended, will fax

## 2018-08-29 ENCOUNTER — Telehealth (HOSPITAL_BASED_OUTPATIENT_CLINIC_OR_DEPARTMENT_OTHER): Payer: Self-pay | Admitting: Obstetrics/Gynecology

## 2018-08-29 ENCOUNTER — Encounter (HOSPITAL_BASED_OUTPATIENT_CLINIC_OR_DEPARTMENT_OTHER): Payer: Self-pay

## 2018-08-29 NOTE — Telephone Encounter (Signed)
RETURN CALL: Voicemail - General Message      SUBJECT:  General Message     MESSAGE: Patient called requesting to speak with the clinic to discuss her appointment on 09/04/2018.  Please call and assist.  Thank you.

## 2018-08-29 NOTE — Telephone Encounter (Signed)
Pt confirmed telemed. Ok to keep appt with Dr. Baltazar Najjar per Dr. Ammie Ferrier referral note.

## 2018-09-04 ENCOUNTER — Ambulatory Visit: Payer: Medicare PPO | Attending: Obstetrics/Gynecology | Admitting: Obstetrics/Gynecology

## 2018-09-04 DIAGNOSIS — N3941 Urge incontinence: Secondary | ICD-10-CM | POA: Insufficient documentation

## 2018-09-04 DIAGNOSIS — N3281 Overactive bladder: Secondary | ICD-10-CM | POA: Insufficient documentation

## 2018-09-04 DIAGNOSIS — R3129 Other microscopic hematuria: Secondary | ICD-10-CM | POA: Insufficient documentation

## 2018-09-04 NOTE — Progress Notes (Signed)
Distant Site Telemedicine Encounter    I conducted this encounter from [facility] via secure, live, face-to-face video conference with the patient. Robin Hart was located at home with no one else present.  Prior to the interview, the risks and benefits of telemedicine were discussed with the patient and verbal consent was obtained.        ID/CC: [name] is a 78 year old yo P3 with chief complaint of overactive bladder.    PCP: Samella Parr, MD  Referring MD: Renelda [place] [address]      HPI:  [name] presents for evaluation and treatment of OAB/urge incontinence since the birth of her second child in 1977, gradually worsening over time.  She wonders if there is anything new she can try.    She has tried in the past:  oxytrol - no help  mirabegron - headache so had to stop  PTNS - no help  PT - a little improvement  Vaginal estrogen - a little improvement, just restarted 2 weeks ago    She has had blood in 4 urine dips in the past year and no microscopy done.  She denies gross hematuria.    Social History     Socioeconomic History   . Marital status: Divorced     Spouse name: Not on file   . Number of children: 3   . Years of education: Not on file   . 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 Financial resource strain: Not hard at all   . Food insecurity     Worry: Patient refused     Inability: Patient refused   . Transportation needs     Medical: Patient refused     Non-medical: Patient refused   Tobacco Use   . Smoking status: Never Smoker   . Smokeless tobacco: Never Used   Substance and Sexual Activity   . Alcohol use: Yes     Frequency: Monthly or less     Drinks per session: 1 or 2     Comment: occasionally   . Drug use: No   . Sexual activity: Not Currently   Lifestyle   . Physical activity     Days per week: 2 days     Minutes per session: 60  min   . Stress: Not at all   Relationships   . Social Product manager on phone: Not on file     Gets together: Not on file     Attends religious service: Not on file     Active member of club or organization: Not on file     Attends meetings of clubs or organizations: Not on file     Relationship status: Not on file   . Intimate partner violence     Fear of current or ex partner: Patient refused     Emotionally abused: Patient refused     Physically abused: Patient refused     Forced sexual activity: Patient refused   Other Topics Concern   . Not on file   Social History Narrative   . Not on file       Patient Active Problem List    Diagnosis Date Noted   . Migraine with vertigo [G43.109] 01/18/2018   . Migraine without aura and  without status migrainosus, not intractable [G43.009] 01/18/2018   . Chronic daily headache [R51] 01/18/2018   . Chronic pain of both shoulders [M25.511, G89.29, M25.512] 06/19/2017   . Somatic dysfunction of upper extremity [M99.07] 06/19/2017   . 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 Spondylosis of lumbosacral region without myelopathy or radiculopathy [M47.817] 11/10/2016   . Lumbosacral radiculopathy at L5 [M54.17] 11/10/2016   . Chronic bilateral low back pain with right-sided sciatica [M54.41, G89.29] 11/10/2016   . Somatic dysfunction of right lower extremity [M99.06] 11/10/2016   . Nonallopathic lesion of lumbosacral region [M99.9] 11/10/2016   . Urge incontinence [N39.41] 10/03/2016   . 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     . Functional dyspepsia [K30] 07/19/2016   . Pelvic floor dysfunction [M62.89] 05/04/2015   . 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 Nausea [R11.0] 05/04/2015   . OAB (overactive bladder) [N32.81] 03/05/2015      Doing 2 studies at [facility]    Last Assessment & Plan:   Evaluation by urology Dr. Vikki Ports and 2013 reviewed  Prior ineffective response to PTNS, Myrbertriq or oxytrol patch - in interval pt has retired and eliminated caffeine from diet -   Has resumed Myrbetiq in past 2 weeks fr new trial - will watch BP and follow up with uro on effectiveness of med tx     . 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 No changes, encouraged HEP, continue core strength.      . Osteoporosis [M81.0] 11/21/2014   . Irritable bowel syndrome [K58.9] 08/25/2014   . Hypertension [I10] 07/07/2014     Overview:   Borderline     Last Assessment & Plan:   Formatting of this note might be different from the original.  Improved on low dose ARB -but higher dose exacerbates BPPV symptoms -  Added HCTZ 05/2011 and saw cards 11/2011 -  Changed to low dose beta-blocker 10/2013 because of headache, but declined to start same as home BP log with SBP range 110-140  The current treatment appears effective;  continue present plan and medications.     BP Readings from Last 3 Encounters:   11/14/13 140/72   10/25/13 160/72   09/20/13 122/70      . Migraine with aura and without status migrainosus, not intractable [G43.109] 12/03/2013     Last Assessment & Plan:   Patient has been responding very well to osteopathic manipulation. Encourage patient to continue the home exercises and continue to work posture changes. We discussed ergonomic changes during the workday. Patient will also make these changes when driving. Patient will come back and see me again in 6 weeks for further evaluation and treatment.     . Hyperlipidemia [E78.5] 01/21/2010     Overview:   Mild       Last Assessment & Plan:   Treat BP if elevated         Past Medical History:   Diagnosis Date   . Allergic  rhinitis due to allergen    . Arthritis    .  Cancer ([place])     skin cell carcinoma    . Constipation    . Diverticulosis    . Glaucoma    . H. pylori infection    . Headache    . History of diarrhea    . History of irritable bowel syndrome    . Hypertension    . Lipidemia    . Osteoporosis    . Primary insomnia        Past Surgical History:   Procedure Laterality Date   . Basal cell carcinoma excision     . BLADDER SUSPENSION     . BREAST BIOPSY     . COLONOSCOPY STOMA DX INCLUDING COLLJ SPEC SPX     . TOTAL ABDOMINAL HYSTERECT W/WO RMVL TUBE OVARY         Current Outpatient Medications   Medication Sig Dispense Refill   . albuterol HFA (ProAir HFA) 108 (90 Base) MCG/ACT inhaler Inhale 2 puffs by mouth every 4 hours as needed for shortness of breath/wheezing. For Asthma. May use every 2 hours for attacks, or preventatively before exercise. 1 Inhaler 2   . AMLODIPINE 2.5 MG tablet TAKE 1 TABLET BY MOUTH DAILY 90 tablet 1   . azithromycin 250 MG tablet Take 2 tablets today, then take 1 tablet every day until gone. 6 tablet 0   . chlorthalidone 25 MG Oral Tablet Take 0.5 tablets (12.5 mg) by mouth daily. For high blood pressure. 45 tablet 3   . estradiol 0.1 MG/GM vaginal cream Place 2 g into the vagina at bedtime 2 times a week 42.5 g 1   . fluticasone propionate 50 MCG/ACT nasal spray Spray 2 sprays into each nostril daily. 18.2 mL 3   . losartan 100 MG tablet TAKE 1 TABLET(100 MG) BY MOUTH DAILY 90 tablet 1   . ZIOPTAN 0.0015 % Ophthalmic Solution        No current facility-administered medications for this visit.        Review of patient's allergies indicates:  Allergies   Allergen Reactions   . Adhesives Itching       Family History     Problem (# of Occurrences) Relation (Name,Age of Onset)    Breast Cancer (2) Sister, Maternal Aunt    Cancer (1) Mother (49): Throat cancer    Diabetes (1) Daughter: TYPE I    GI (1) Mother (34): IBS    Hypertension (1) Sister    Myocardial Infarction (1) Brother (106)    No Known  Problems (1) Father (23's)       Negative family history of: Migraine Headaches          PHYSICAL EXAM:  (Telemedicine)  Gen: Well nourished, well developed woman in NAD. Good attention to grooming      LABS/PROCEDURES:  Urine dips 2019 reviewed: trace to 4+ blood.    ASSESSMENT AND PLAN:  [name] is a 78 year old yo P3 with OAB and blood on urine dips.    OAB - we discussed InterStim and bladder botox as treatments she has not tried. She is interested in trying bladder botox.    Blood on urine dips - UA with microscopy to evaluate for RBCs. If present, then CT IVP and cystoscopy.  If no RBCs, then schedule bladder botox.

## 2018-09-04 NOTE — Patient Instructions (Signed)
UA with microscopy to evaluate for RBCs. If present, then CT IVP and cystoscopy.  If no RBCs, then schedule bladder botox.

## 2018-09-06 ENCOUNTER — Encounter (HOSPITAL_BASED_OUTPATIENT_CLINIC_OR_DEPARTMENT_OTHER): Payer: Self-pay | Admitting: Obstetrics/Gynecology

## 2018-09-06 NOTE — Telephone Encounter (Signed)
Left patient a message to call the clinic.

## 2018-09-06 NOTE — Telephone Encounter (Signed)
Patient retuning call to RN [facility]

## 2018-09-07 ENCOUNTER — Other Ambulatory Visit (INDEPENDENT_AMBULATORY_CARE_PROVIDER_SITE_OTHER): Payer: Medicare PPO

## 2018-09-07 ENCOUNTER — Telehealth (HOSPITAL_BASED_OUTPATIENT_CLINIC_OR_DEPARTMENT_OTHER): Payer: Self-pay | Admitting: Obstetrics/Gynecology

## 2018-09-07 ENCOUNTER — Other Ambulatory Visit (HOSPITAL_BASED_OUTPATIENT_CLINIC_OR_DEPARTMENT_OTHER): Payer: Self-pay | Admitting: Obstetrics/Gynecology

## 2018-09-07 ENCOUNTER — Other Ambulatory Visit
Admit: 2018-09-07 | Discharge: 2018-09-07 | Disposition: A | Discharge status: Home / Self Care | Payer: Medicare PPO | Attending: Obstetrics/Gynecology | Admitting: Obstetrics/Gynecology

## 2018-09-07 DIAGNOSIS — R3129 Other microscopic hematuria: Secondary | ICD-10-CM | POA: Insufficient documentation

## 2018-09-07 DIAGNOSIS — N3281 Overactive bladder: Secondary | ICD-10-CM | POA: Insufficient documentation

## 2018-09-07 DIAGNOSIS — N3941 Urge incontinence: Secondary | ICD-10-CM

## 2018-09-07 LAB — URINALYSIS COMPLETE, URN
Bacteria, URN: NONE SEEN
Bilirubin (Qual), URN: NEGATIVE
Epith Cells_Renal/Trans,URN: NEGATIVE /HPF
Glucose Qual, URN: NEGATIVE mg/dL
Ketones, URN: NEGATIVE mg/dL
Leukocyte Esterase, URN: NEGATIVE
Nitrite, URN: NEGATIVE
Occult Blood, URN: NEGATIVE
Protein (Alb Semiquant), URN: NEGATIVE mg/dL
RBC, URN: NEGATIVE /HPF
Specific Gravity, URN: 1.016 g/mL (ref 1.006–1.027)
WBC, URN: NEGATIVE /HPF

## 2018-09-07 NOTE — Telephone Encounter (Signed)
Patient called to inform Dr. Baltazar Najjar that she is not able to leave a urine sample until 6/11.   Patient states that this is the soonest the lab had available and would accommodate her schedule.  Sending message to Dr. Baltazar Najjar as Juluis Rainier.

## 2018-09-07 NOTE — Telephone Encounter (Signed)
Spoke with Shirlean Mylar from [facility] who stated that the patient has future blood work orders which may be the reason why she was originally told that first available lab appt date is 6/11.  Shirlean Mylar states that for just a urine sample, the patient can come anytime when the lab is open.  Shirlean Mylar will reach out to the patient to give her this information.

## 2018-09-07 NOTE — Telephone Encounter (Signed)
Hi Robin Hart pt returning call.

## 2018-09-07 NOTE — Progress Notes (Signed)
LAB    URINE SAMPLE GIVEN    UAC W/CULTURE ORDER VERIFIED.

## 2018-09-07 NOTE — Telephone Encounter (Signed)
Left patient a message to call the clinic.

## 2018-09-08 LAB — URINE C/S: Culture: 11000

## 2018-09-10 NOTE — Telephone Encounter (Signed)
Spoke with patient who states that she is not having an UTI symptoms. Has ongoing urinary incontinence that she has dealt with for years.  Plan was to leave a urine sample and if she still had blood in the urine then proceed with a cystoscopy otherwise, bladder Botox injections.  Patient would like Dr. Anson Crofts input on the bladder Botox injections since she is concerned about urinary retention and the possibility of having to perform [place].  Patient states that she is available to discuss this via telephone or Dr. Baltazar Najjar could give her input via [place].  Sending message to Dr. Baltazar Najjar for her response.

## 2018-09-11 NOTE — Telephone Encounter (Signed)
Called patient and answered multiple questions she has regarding in clinic bladder Botox injection procedure.  Patient scheduled for bladder Botox injections on 8/11 with Dr. Baltazar Najjar.

## 2018-09-11 NOTE — Telephone Encounter (Signed)
Closing TE. Message addressed in another encounter, see eCare message from 09/06/18 for details.

## 2018-09-11 NOTE — Telephone Encounter (Signed)
Left patient a message to call the clinic.

## 2018-09-13 ENCOUNTER — Other Ambulatory Visit (INDEPENDENT_AMBULATORY_CARE_PROVIDER_SITE_OTHER): Payer: Medicare PPO

## 2018-09-14 ENCOUNTER — Encounter (HOSPITAL_BASED_OUTPATIENT_CLINIC_OR_DEPARTMENT_OTHER): Payer: Self-pay | Admitting: Obstetrics/Gynecology

## 2018-09-14 DIAGNOSIS — N3281 Overactive bladder: Secondary | ICD-10-CM

## 2018-09-19 ENCOUNTER — Ambulatory Visit: Payer: Medicare PPO | Attending: Internal Medicine

## 2018-09-19 DIAGNOSIS — Z1231 Encounter for screening mammogram for malignant neoplasm of breast: Secondary | ICD-10-CM | POA: Insufficient documentation

## 2018-09-20 MED ORDER — TROSPIUM CHLORIDE ER 60 MG OR CP24
1.0000 | EXTENDED_RELEASE_CAPSULE | Freq: Every day | ORAL | 11 refills | Status: DC
Start: 2018-09-20 — End: 2018-11-26

## 2018-09-30 ENCOUNTER — Encounter (HOSPITAL_BASED_OUTPATIENT_CLINIC_OR_DEPARTMENT_OTHER): Payer: Self-pay | Admitting: Obstetrics/Gynecology

## 2018-10-03 ENCOUNTER — Ambulatory Visit (INDEPENDENT_AMBULATORY_CARE_PROVIDER_SITE_OTHER): Payer: Medicare PPO | Admitting: Otolaryngology

## 2018-10-03 ENCOUNTER — Encounter (INDEPENDENT_AMBULATORY_CARE_PROVIDER_SITE_OTHER): Payer: Self-pay | Admitting: Otolaryngology

## 2018-10-03 VITALS — BP 144/65 | HR 68 | Ht 61.0 in | Wt 120.0 lb

## 2018-10-03 DIAGNOSIS — H6123 Impacted cerumen, bilateral: Secondary | ICD-10-CM

## 2018-10-03 DIAGNOSIS — H6063 Unspecified chronic otitis externa, bilateral: Secondary | ICD-10-CM

## 2018-10-03 MED ORDER — FLUOCINOLONE ACETONIDE 0.01 % EX SOLN
CUTANEOUS | 1 refills | Status: DC
Start: 2018-10-03 — End: 2018-11-26

## 2018-10-03 NOTE — Patient Instructions (Signed)
Itchy Ears  Itchy ears are a very common problem. For some people the problem is so bad that they stick various objects into the ears, causing trauma to the ear canal. The most common causes of itching are a nervous habit, fungal infection or the beginning of an infection. Other causes can be skin diseases such as psoriasis or dermatitis. Some people with allergies complain of Itchy ears.    The ear canal may be normal on examination or there may be scaling of the skin. People aggravate the problem by using things such as bobby-pins, coat hangers and tooth picks to scratch the ear. This can produce abrasions of the ear canal.  Any break in the skin can allow bacteria to enter through this protective barrier. The ear will then become infected requiring it to be treated the same as swimmer's ear.    Itching by itself without evidence of trauma or infection can be treated with a mild steroid ear drop.  A few drops placed in the ear will help to decrease the amount of itching. Another helpful treatment is the use of 70% alcohol (rubbing alcohol) as an ear drop. CAUTION: itchy ears may be the first signal that an infection is developing. If the ear is infected the alcohol will burn.        Thank you for choosing [place] at [facility] to be a part of your health care team!  We care about your experience here and want your journey to be as frictionless as possible.  Your feedback is very important to our practice. Should you receive a [place] patient satisfaction survey, please let us know how we are doing.  [place] accepts 9-10 as OK and satisfied, 1-8 as no credit.  It is our goal to serve you with the highest quality care.                                          Thank you, Zella Ball & Dr. Augustin Coupe  :)

## 2018-10-03 NOTE — Progress Notes (Signed)
Office Visit      PATIENT:  Robin Hart  DOB:  1940/06/06  MRN:  [id number]      CHIEF COMPLAINT/HPI:  Patient is a 22 year oldfemalewho presented with chronic otitis externa for several years.  Symptoms are   moderate,intermittent, andimproved.  She is here for fluocinolone otic gtt refill.  She saw Dr. Verdene Lennert for   migraine associated dizziness.  Her [facility] neurologist diagnosed migraine associated dizziness.  She had   vestibular therapy.  She continues to use Q-tips.She deniesotalgia, otorrhea, tinnitus, and hearing loss.        Prior testing:   Audiogram 07/10/17:  WNL, tymp A AU, SDS 100% AU  Audiogram 02/14/17: WNL, tymp A AU, SDS 100% AU  VNG: WNL  ECOG: AD 0.23, AS 0.10 (WNL = <.45)  ABR: WNL  MRI brain 02/27/17:  Foci of apparent microhemorrhage in the pons and thalami bilaterally with   pattern most consistent with hypertensive disease.  MRA head/neck 02/27/17:  WNL      HEARING AID USE: None        PMHX:  Past Medical History:   Diagnosis Date   . Allergic rhinitis due to allergen    . Arthritis    . Cancer ([place])     skin cell carcinoma    . Constipation    . Diverticulosis    . Glaucoma    . H. pylori infection    . Headache    . History of diarrhea    . History of irritable bowel syndrome    . Hypertension    . Lipidemia    . Osteoporosis    . Primary insomnia      PSHX:  Past Surgical History:   Procedure Laterality Date   . Basal cell carcinoma excision     . BLADDER SUSPENSION     . BREAST BIOPSY     . COLONOSCOPY STOMA DX INCLUDING COLLJ SPEC SPX     . TOTAL ABDOMINAL HYSTERECT W/WO RMVL TUBE OVARY       FHX: @HXFAMILY @  SHX:  Social History     Socioeconomic History   . Marital status: Divorced     Spouse name: Not on file   . Number of children: 3   . Years of education: Not on file   . Highest education level: Not on file   Occupational History     Comment: [employer] elsewhere   Social Needs   . Financial resource strain: Not hard at all   .  Food insecurity     Worry: Patient refused     Inability: Patient refused   . Transportation needs     Medical: Patient refused     Non-medical: Patient refused   Tobacco Use   . Smoking status: Never Smoker   . Smokeless tobacco: Never Used   Substance and Sexual Activity   . Alcohol use: Yes     Frequency: Monthly or less     Drinks per session: 1 or 2     Comment: occasionally   . Drug use: No   . Sexual activity: Not Currently   Lifestyle   . Physical activity     Days per week: 2 days     Minutes per session: 60 min   . Stress: Not at all   Relationships   . Social Product manager on phone: Not on file     Gets together: Not on file  Attends religious service: Not on file     Active member of club or organization: Not on file     Attends meetings of clubs or organizations: Not on file     Relationship status: Not on file   . Intimate partner violence     Fear of current or ex partner: Patient refused     Emotionally abused: Patient refused     Physically abused: Patient refused     Forced sexual activity: Patient refused   Other Topics Concern   . Not on file   Social History Narrative   . Not on file       ALLERGIES:  is allergic to adhesives.    MEDICATIONS:  has a current medication list which includes the following prescription(s): albuterol hfa, amlodipine, azithromycin, chlorthalidone, estradiol, fluocinolone, fluticasone propionate, losartan, trospium chloride er, and zioptan.       REVIEW OF SYSTEMS:    Constitutional: negative  [id number]  ENT: + dizziness, vertigo,  itchy ears, excessive cerumen. Denies hearing loss, infections, tinnitus, noise exposure, Nose/Sinus: anosmia/hyposmia, nasal drainage, epistaxis, facial pain, nasal congestion, nasal obstruction, rhinorrhea, sinusitis, sneezing, Throat: taste change, voice change, hoarseness, dysphagia, lump in throat, odynophagia, post-nasal drainage, sore tongue, pharyngitis, snoring, tooth pain   Neurological: + dizziness, vertigo, h/o  migraines      PHYSICAL EXAMINATION:  Vitals:    10/03/18 1315   BP: (!) 144/65   BP Cuff Size: Regular   BP Site: Left Forearm   BP Position: Sitting   Pulse: 68   Weight: 120 lb (54.4 kg)   Height: 5\' 1"  (1.549 m)        General: Well developed, appearing stated age and in no acute distress. Participated in the patient interview appropriately.  Eyes: Clear conjunctive, normal lids. Pupils equal, round and reactive to light. Extraocular movements intact.  Ears:visualized by microscopy.  Right:Pinna unremarkable. External ear canal without masses or skin abnormality. + cerumen impaction. Tympanic membrane intact and without retraction or perforation. Middle ear without fluid.No hearing loss.   Left: Pinna unremarkable. External ear canal without masses or skin abnormality. + cerumen impaction. Tympanic membrane intact and without retraction or perforation. Middle ear without fluid.No hearing loss.     Neuro:CN II-XIIgrossly intact (VII Movements of facial expression bilaterally intact and symmetrical), Negative Dix-Hallpike.      Ear impacted Cerumen Removal Procedure Note:  Consent obtained. Procedure discussed. Questions answered. Correct patient identified.    Right:  Microscope used.  Cerumen impaction completely removed.  Left:  Microscope used.  Cerumen impaction completely removed.    Removal method:  Suction    Patient tolerated procedure well.        IMPRESSION/PLAN: Chronic otitis externa, Impacted cerumen   Prescribe Fluocinolone oil for itchy ears  Routine cerumen removal        Kirke Shaggy, MD      Cc:  Rachel Moulds Mathews Robinsons, MD  Renelda Loma, MD  [address] [place] [address]

## 2018-10-25 ENCOUNTER — Other Ambulatory Visit (INDEPENDENT_AMBULATORY_CARE_PROVIDER_SITE_OTHER): Payer: Self-pay | Admitting: Otolaryngology

## 2018-10-25 ENCOUNTER — Encounter (INDEPENDENT_AMBULATORY_CARE_PROVIDER_SITE_OTHER): Payer: Self-pay | Admitting: Otolaryngology

## 2018-10-25 ENCOUNTER — Telehealth (INDEPENDENT_AMBULATORY_CARE_PROVIDER_SITE_OTHER): Payer: Self-pay | Admitting: Otolaryngology

## 2018-10-25 DIAGNOSIS — H6063 Unspecified chronic otitis externa, bilateral: Secondary | ICD-10-CM

## 2018-10-25 MED ORDER — FLUOCINOLONE ACETONIDE 0.01 % OT OIL
4.0000 [drp] | TOPICAL_OIL | Freq: Two times a day (BID) | OTIC | 2 refills | Status: DC | PRN
Start: 2018-10-25 — End: 2021-04-28

## 2018-10-25 NOTE — Telephone Encounter (Signed)
Called patient back relayed Dr.Lin's message"topical is an ear drop. She does not need a pill". Pt states the initial Rx has active ingredient(peanut oil) the new Rx is to dissolve in water. She want to know which of this Rx does Dr Augustin Coupe think is more active as she will prefer the first Rx with peanut oil active ingredient.

## 2018-10-25 NOTE — Progress Notes (Signed)
Error

## 2018-10-25 NOTE — Telephone Encounter (Signed)
Spoke to patient, sent new rx for fluocinolone oil.  She preferred oil base.

## 2018-10-25 NOTE — Telephone Encounter (Signed)
Patient called and had a question about her medication, fluocinolone, she wants know why the spray was changed to topical.

## 2018-11-07 ENCOUNTER — Encounter (INDEPENDENT_AMBULATORY_CARE_PROVIDER_SITE_OTHER): Payer: Self-pay | Admitting: Foot & Ankle Surgery

## 2018-11-11 MED ORDER — ONABOTULINUMTOXINA 100 UNITS IJ SOLR
100.0000 [IU] | Freq: Once | INTRAMUSCULAR | Status: AC
Start: 2018-11-13 — End: 2018-11-13
  Administered 2018-11-13: 100 [IU]

## 2018-11-13 ENCOUNTER — Ambulatory Visit: Payer: Medicare PPO | Attending: Obstetrics/Gynecology | Admitting: Obstetrics/Gynecology

## 2018-11-13 ENCOUNTER — Telehealth (HOSPITAL_BASED_OUTPATIENT_CLINIC_OR_DEPARTMENT_OTHER): Payer: Self-pay | Admitting: Obstetrics/Gynecology

## 2018-11-13 VITALS — BP 116/63 | HR 65 | Temp 98.6°F | Ht 61.0 in | Wt 120.0 lb

## 2018-11-13 DIAGNOSIS — N3941 Urge incontinence: Secondary | ICD-10-CM | POA: Insufficient documentation

## 2018-11-13 DIAGNOSIS — N3281 Overactive bladder: Secondary | ICD-10-CM | POA: Insufficient documentation

## 2018-11-13 LAB — PR U/A NONAUTO DIPSTICK ONLY, ONSITE
Bilirubin (Indirect): NEGATIVE
Glucose, Urine: NEGATIVE mg/dL
Ketones, URN: NEGATIVE mg/dL
Leukocytes: NEGATIVE
Nitrite, URN: NEGATIVE
Occult Blood, URN: NEGATIVE
Protein: NEGATIVE mg/dL
Urobilinogen, URN: 0.2 E.U./dL (ref 0.2–1.0)
pH, URN (UWNC): 7 (ref 5.0–8.0)

## 2018-11-13 NOTE — Telephone Encounter (Signed)
Pt told to schedule in 2 weeks with Dr. Baltazar Najjar for f/u. Nothing to offer in that time frame. Please advise date and time

## 2018-11-13 NOTE — Progress Notes (Signed)
Preoperative diagnosis: Refractory Overactive Bladder, Urgency Urinary Incontinence  Postoperative diagnosis: Same as above  Procedure: Cystoscopic Botox Injection  Anesthesia: local   Medication: 100 units of Botox A      SUBJECTIVE  Robin Hart presents today for cystoscopy exam. She confirmed with her ophthalmologist that she could take sanctura, so she tried it but then stopped because it made her tired and did not improve her bladder at all.     Findings: Negative urinalysis prior to procedure.  Normal bladder mucosa; no inflammation, stone neoplasms, bladder diverticulum, trabeculation or other bladder abnormalities.      Procedure in detail: The risks, benefits, indications, and alternatives of the procedure were reviewed with the patient at length. The risks, including infection, bleeding, and injury to surrounding structures were all discussed with the patient. The risk of persistent symptoms or recurrence was also explained at length. The risk of urinary retention was also discussed with the patient. It was emphasized that should she experience any weakness, shortness of breath, dizziness, chest pain, etc., that she should report to the emergency room immediately. The patient verbalized her understanding of all of the above and desires to proceed with aforementioned plan of management.    After prepping, 10 cc of 2% lidocaine jelly was injected through urethra, and Lidocaine mixture was passed through foley catheter for local anesthesia. Cystoscopy was then performed with a 12 degree lens, and no bladder lesions were noted, bilateral ureteral orifices were visualized. A total of 100 units Botox/saline mixture was injected in multiple sites above the trigone. Minimal bleeding was noted during injections.     ASSESSMENT:   Normal-appearing bladder   100 units botox injected.     PLAN:   Follow-up 2 weeks RN visit for POST-VOID RESIDUAL if symptoms worsen or fail to improve.  12 weeks or for next injection  with Robin Hart.

## 2018-11-13 NOTE — Progress Notes (Signed)
Patient seen in clinic for bladder Botox injections. Standard prep included 48mL Lidocaine 1% into bladder. Lidocaine gel into the urethra and preparation of Botox 100 Units. Verified name and DOB. Confirmed procedure with the patient. Details of procedure reviewed with patient and consent signed. Patient denies any allergy to Betadine (Topical), Lidocaine, Latex, or Botox 100 Units.  Urethra meatus and labia prepped in the usual manner with Betadine, 77LT all silicone catheter inserted easily, and urine collected for urine dip. Bladder emptied for approximately 173mL clear yellow urine prior to instillation of 54mL Lidocaine 1%. Patient instructed to keep liquid Lidocaine 1% in bladder for 20 minutes. Urine results unremarkable, results charted.  Full Betadine prep completed, Lidocaine gel instilled into the urethra, then both patinet and procedure area draped with sterile towels.  Dr. Baltazar Najjar performed the Botox injections. Patient tolerated procedure well. Final verification, implant information sheet, and consent placed in folder for scanning into chart.    Botox 100 Units  Lot [id number]  Expiration: 02/02/2021    Nelta Numbers, RN      Results for orders placed or performed in visit on 11/13/18   U/A NONAUTO DIPSTICK ONLY, ONSITE   Result Value Ref Range    Color, URN Yellow     Clarity, URN Clear     Glucose, Urine Negative NEG mg/dL    Bilirubin (Indirect) Negative NEG    Ketones, URN Negative NEG mg/dL    Specific Gravity, URN      Occult Blood, URN Negative NEG    pH, URN (UWNC) 7.0 5.0 - 8.0    Protein Negative NEG-TRACE mg/dL    Urobilinogen, URN 0.2 0.2 - 1.0 E.U./dL    Nitrite, URN Negative NEG    Leukocytes Negative NEG

## 2018-11-23 ENCOUNTER — Telehealth (HOSPITAL_BASED_OUTPATIENT_CLINIC_OR_DEPARTMENT_OTHER): Payer: Self-pay | Admitting: Obstetrics/Gynecology

## 2018-11-23 NOTE — Telephone Encounter (Signed)
Spoke with patient, verified that she is not having any symptoms and does not see anyone at [facility] who had advised her to go to the ED today for hydronephrosis.  Patient states that she had called the clinic to find out if she needs to come in person for upcoming scheduled appt or if that appt can be converted to telehealth visit.  Informed patient that upcoming RN visit appt is to make ensure bladder is emptying adequately, therefore, she will need to come in person for this visit.  Patient states that she will come to her appt as scheduled. Patient did not have any further questions at this time.

## 2018-11-23 NOTE — Telephone Encounter (Signed)
TE N/A. Please disregard this encounter. Data entered into wrong patient.

## 2018-11-23 NOTE — Telephone Encounter (Signed)
[facility] notifying clinic that pt was advised to go to the ER for worsening Hydronephrosis

## 2018-11-26 ENCOUNTER — Ambulatory Visit (INDEPENDENT_AMBULATORY_CARE_PROVIDER_SITE_OTHER): Payer: Medicare PPO | Admitting: Internal Medicine

## 2018-11-26 ENCOUNTER — Encounter (INDEPENDENT_AMBULATORY_CARE_PROVIDER_SITE_OTHER): Payer: Self-pay | Admitting: Internal Medicine

## 2018-11-26 VITALS — BP 141/68 | HR 64 | Ht 61.0 in | Wt 120.0 lb

## 2018-11-26 DIAGNOSIS — M722 Plantar fascial fibromatosis: Secondary | ICD-10-CM

## 2018-11-26 DIAGNOSIS — M48061 Spinal stenosis, lumbar region without neurogenic claudication: Secondary | ICD-10-CM

## 2018-11-26 DIAGNOSIS — R768 Other specified abnormal immunological findings in serum: Secondary | ICD-10-CM

## 2018-11-26 NOTE — Progress Notes (Signed)
Rob[place], [place]  [id number]  TEL: [phone]  l  FAX: [phone]    New Patient Initial Evaluation  11/26/2018    PRIMARY CARE PROVIDER:  Renelda Loma, MD  [address]  [place],  [place] [address]    REFERRING PROVIDER:  [place]  [address] [place] [address]    IDENTIFYING DATA:  MRN: [id number]  DOB: Jan 10, 1941    CHIEF COMPLAINT:    Robin Hart is a 78 year old female seen today for an initial consultation of recent positive CCP test and concern for potential underlying connective tissue disease    HISTORY OF PRESENT ILLNESS:  The patient is a very pleasant 78 year old woman who presents today for initial evaluation.  She carries known diagnosis of low back pain with right-sided L4 and L5 radiculopathy with corresponding facet arthritis and impingement noted on previous MRI on 11/15/2016.  She is seen Dr. Harlin Heys for this in the past and at the current time she is managing this with physical therapy and a very seldom dose of naproxen.  She feels that her back pain in general has been overall stable and not worsening to the point that she would consider epidural steroid injection or surgery.    The patient developed bilateral plantar fasciitis left greater than right in October 2019.  She saw physical therapy and podiatry, received 2 steroid injections and reports that the symptoms have been getting better.  She also reports having some longstanding discomfort in the bilateral first MCP joints without any swelling, redness, warmth and this is currently managed using occasional topical diclofenac.  She also has had some right shoulder discomfort especially with raising her arm above the head that is managed by physical therapy.  She notes some mild morning stiffness in her back and plantar fascia region that is generally 20 minutes or less.  She denies any history of dactylitis, psoriasis, rashes,  inflammatory bowel disease, inflammatory eye disease, or recurrent tendinitis.  She does report history of irritable bowel syndrome.  Overall she denies any joint swelling, redness, or warmth or morning stiffness lasting longer than 20 minutes.  She denies any family history of rheumatoid arthritis or other inflammatory arthritis conditions.    The patient's podiatrist did do a preliminary autoimmune work-up due to her bilateral plantar fasciitis that showed a negative ANA, negative rheumatoid factor, mildly elevated CCP autoantibody of 35, and an ESR of 12 and CRP of 3.    REVIEW OF SYSTEMS:  Complete ROS is negative, except for those mentioned in HPI.    PAST MEDICAL/SURGICAL HISTORY:  Patient Active Problem List    Diagnosis Date Noted   . Migraine with vertigo [G43.109] 01/18/2018   . Migraine without aura and without status migrainosus, not intractable [G43.009] 01/18/2018   . Chronic daily headache [R51] 01/18/2018   . Chronic pain of both shoulders [M25.511, G89.29, M25.512] 06/19/2017   . Somatic dysfunction of upper extremity [M99.07] 06/19/2017   . 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 Spondylosis of lumbosacral region without myelopathy or radiculopathy [M47.817] 11/10/2016   . Lumbosacral radiculopathy at L5 [M54.17] 11/10/2016   . Chronic bilateral low back pain with right-sided sciatica [M54.41, G89.29] 11/10/2016   . Somatic dysfunction  of right lower extremity [M99.06] 11/10/2016   . Nonallopathic lesion of lumbosacral region [M99.9] 11/10/2016   . Urge incontinence [N39.41] 10/03/2016   . 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     . Functional dyspepsia [K30] 07/19/2016   . Pelvic floor dysfunction [M62.89] 05/04/2015   . 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  Nausea [R11.0] 05/04/2015   . OAB (overactive bladder) [N32.81] 03/05/2015     Doing 2 studies at [facility]    Last Assessment & Plan:   Evaluation by urology Dr. Vikki Ports and 2013 reviewed  Prior ineffective response to PTNS, Myrbertriq or oxytrol patch - in interval pt has retired and eliminated caffeine from diet -   Has resumed Myrbetiq in past 2 weeks fr new trial - will watch BP and follow up with uro on effectiveness of med tx     . 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 No changes, encouraged HEP, continue core strength.      . Osteoporosis [M81.0] 11/21/2014   . Irritable bowel syndrome [K58.9] 08/25/2014   . Hypertension [I10] 07/07/2014     Overview:   Borderline     Last Assessment & Plan:   Formatting of this note might be different from the original.  Improved on low dose ARB -but higher dose exacerbates BPPV symptoms -  Added HCTZ 05/2011 and saw cards 11/2011 -  Changed to low dose beta-blocker 10/2013 because of headache, but declined to start same as home BP log with SBP range 110-140  The current treatment appears effective;  continue present plan and medications.     BP Readings from Last 3 Encounters:   11/14/13 140/72   10/25/13 160/72   09/20/13 122/70      . Migraine with aura and without status migrainosus, not intractable [G43.109] 12/03/2013     Last Assessment & Plan:   Patient has been responding very well to osteopathic manipulation. Encourage patient to continue the home exercises and continue to work posture changes. We discussed ergonomic changes during the workday. Patient will also make these changes when driving. Patient will come back and see me again in 6 weeks for further evaluation and treatment.     . Hyperlipidemia [E78.5] 01/21/2010     Overview:   Mild       Last Assessment & Plan:    Treat BP if elevated         FAMILY HISTORY:  Family History     Problem (# of Occurrences) Relation (Name,Age of Onset)    Breast Cancer (2) Sister, Maternal Aunt    Cancer (1) Mother (16): Throat cancer    Diabetes (1) Daughter: TYPE I    GI (1) Mother (22): IBS    Hypertension (1) Sister    Myocardial Infarction (1) Brother (71)    No Known Problems (1) Father (71's)       Negative family history of: Migraine Headaches          SOCIAL HISTORY:  Social History     Socioeconomic History   . Marital status: Divorced     Spouse name: Not on file   . Number of children: 3   .  Years of education: Not on file   . 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 Financial resource strain: Not hard at all   . Food insecurity     Worry: Patient refused     Inability: Patient refused   . Transportation needs     Medical: Patient refused     Non-medical: Patient refused   Tobacco Use   . Smoking status: Never Smoker   . Smokeless tobacco: Never Used   Substance and Sexual Activity   . Alcohol use: Yes     Frequency: Monthly or less     Drinks per session: 1 or 2     Comment: occasionally   . Drug use: No   . Sexual activity: Not Currently   Lifestyle   . Physical activity     Days per week: 2 days     Minutes per session: 60 min   . Stress: Not at all   Relationships   . Social Product manager on phone: Not on file     Gets together: Not on file     Attends religious service: Not on file     Active member of club or organization: Not on file     Attends meetings of clubs or organizations: Not on file     Relationship status: Not on file   . Intimate partner violence     Fear of current or ex partner: Patient refused     Emotionally abused: Patient refused     Physically abused: Patient refused     Forced sexual activity: Patient refused   Other Topics Concern   . Not on file   Social History Narrative   . Not on file       ALLERGIES:  Review of  patient's allergies indicates:  Allergies   Allergen Reactions   . Adhesives Itching       MEDICATIONS:  Current Outpatient Medications   Medication Sig Dispense Refill   . AMLODIPINE 2.5 MG tablet TAKE 1 TABLET BY MOUTH DAILY 90 tablet 1   . chlorthalidone 25 MG Oral Tablet Take 0.5 tablets (12.5 mg) by mouth daily. For high blood pressure. 45 tablet 3   . estradiol 0.1 MG/GM vaginal cream Place 2 g into the vagina at bedtime 2 times a week 42.5 g 1   . fluocinolone 0.01 % otic oil Place 4 drops to each ear 2 times a day as needed (itchy ears). 1 bottle 2   . losartan 100 MG tablet TAKE 1 TABLET(100 MG) BY MOUTH DAILY 90 tablet 1     No current facility-administered medications for this visit.        PHYSICAL EXAMINATION:  Vital signs:  BP (!) 141/68   Pulse 64   Ht _0  (1.549 m)   Wt 120 lb (54.4 kg)   BMI 22.67 kg/m   General:  Awake, alert, and oriented, no apparent distress, pleasant, and cooperative  Psychologic:  Mood is euthymic, affect is congruent  EYES: Anicteric sclera, no conjunctival injection  ENT:  Normocephalic, atraumatic, moist membranes, no oral ulcers   Pulmonary:  Non-labored breathing, no wheezes, rhonchi, or rales  Cardiovascular:  RRR, no murmurs, rubs or gallops  Skin:  Normal temperature and texture, No visible rashes, ulcers, or nodules  Neurologic:  Light touch sensation is grossly intact. Face symmetric   Musculoskeletal:  Gait: Normal   Joints: No evidence of synovitis, joint swelling, or tenderness in the Hands, wrist, elbows, knees, or ankles.  The patient did have some mild discomfort in the right shoulder with empty can testing, but not with Michel Bickers.   Nails: No pitting, clubbing, or cyanosis noted in the fingernails bilaterally    Muscle strength: Strength is grossly 5 out of 5 throughout the bilateral upper and lower extremities      LABS:  Results for orders placed or performed in visit on 11/13/18   U/A NONAUTO DIPSTICK ONLY, ONSITE   Result Value Ref  Range    Color, URN Yellow     Clarity, URN Clear     Glucose, Urine Negative NEG mg/dL    Bilirubin (Indirect) Negative NEG    Ketones, URN Negative NEG mg/dL    Specific Gravity, URN      Occult Blood, URN Negative NEG    pH, URN (UWNC) 7.0 5.0 - 8.0    Protein Negative NEG-TRACE mg/dL    Urobilinogen, URN 0.2 0.2 - 1.0 E.U./dL    Nitrite, URN Negative NEG    Leukocytes Negative NEG       IMAGING/DIAGNOSTIC RESULTS:   MRI of the lumbar spine from 11/15/2016  IMPRESSION:  1.  Grade 1 spondylolisthesis of L4 on L5 with moderate disc degeneration and severe facet arthropathy causing moderate right lateral recess and foraminal stenosis impinging on the descending right L5 nerve root and exiting right L4 nerve root  2.  Severe bilateral L5-S1 and L3-L4 facet arthropathy  3.  Grade 1 spondylolisthesis of L4 on L5 without spondylosis identified, likely related to ligamentous laxity from facet arthropathy  4.  Probable transverse fracture of S3.  Sacrum is incompletely assessed in the exam.  Recommend clinical correlation regarding the need for formal imaging on the sacrum  5.  1 cm multiloculated ganglion is noted in the interspinous space between L4 and L5 spinous processes likely related to chronic repetitive trauma    ASSESSMENT AND PLAN:  Rylen Hou is a 78 year old female seen today for an initial consultation of Positive CCP test and concern for potential underlying rheumatoid arthritis    Positive CCP test, no clinical evidence of rheumatoid arthritis  The patient is a very pleasant 78 year old woman who has mildly positive CCP test on recent outside laboratory work.  She has negative rheumatoid factor and normal inflammatory markers and does not have inflammatory pattern peripheral arthritis consistent with rheumatoid arthritis at this time.  She does have fairly longstanding low back pain with multiple abnormalities noted on a lumbar MRI from August 2018 and symptoms generally correspond to this.  She  also has been having some bilateral plantar fasciitis symptoms that have improved with appropriate therapy.  At the current time I suspect that she does not have rheumatoid arthritis or another inflammatory arthritis condition such as a spondyloarthropathy.  She has minimal morning stiffness that is more characteristic of her known mechanical back pain and plantar fasciitis and does not have any evidence of synovitis, swelling, or redness on exam.  Brief ultrasound of the right hand also did not show any evidence of grayscale or power Doppler synovitis suggesting subclinical rheumatoid arthritis.     At the current time, we discussed that her anti-CCP autoantibody is either potentially a false positive or potentially may precede clinical syndrome of rheumatoid arthritis that may happen at some point in the future as there is evidence that laboratory evidence of  autoimmunity may precede clinical symptoms for condition such as rheumatoid arthritis.  At the current time we discussed a watchful waiting approach and if symptoms are worsening she will contact us for further evaluation and management.    I spent a total time of 55 minutes face-to-face with the patient, of which more than 50% was spent counseling as outlined in this note.      All the patient's questions were answered. The patient will follow up as needed    CC: Referring physician, PCP    Dictated using speech recognition technology and electronically signed by Whitney Post. Arlington Calix, MD   Note: This document may contain errors inherent to this technology. If you find any errors that might affect patient care, please contact our office as soon as possible.   [address], [place], [place]  Ph. [phone] Fx. [phone]

## 2018-11-26 NOTE — Patient Instructions (Signed)
It was a pleasure seeing you in clinic today.     You do not have evidence of rheumatoid arthritis at this time. I suspect that the mildly positive CCP test is either a false positive test or may indicate that you may be at risk of developing RA in the future. If you do develop increased joint pain with prolonged morning stiffness, please reach out   Follow up if you need [place]    If you have any questions or concerns before your next appointment, please do not hesitate to contact me. Our clinic phone number is [phone] and I am also available via eCare.

## 2018-11-29 ENCOUNTER — Ambulatory Visit: Payer: Medicare PPO | Attending: Dermatology

## 2018-11-29 DIAGNOSIS — Z466 Encounter for fitting and adjustment of urinary device: Secondary | ICD-10-CM | POA: Insufficient documentation

## 2018-11-29 NOTE — Progress Notes (Signed)
Oana RN request for pt to learn CISC.     This RN explained Straight Catheter procedure to pt.  Discussed with Pt, perineal anatomy and landmarks to find urethral opening. Went over in depth self catherization handout with female.   Provided Pt with technique to hold perineum to optimize urethral view. Pt in sitting positioned and handheld mirror placed in front of her for utilization for visualization.    Patient cleansed site with towelettes and with guidance of this RN, Pt able to insert a new #14 straight female self catheter via clean technique into her urethra. First time, pt tried catheter was inserted into her vagina. Encouraged pt to leave catheter in vagina so she will be able to insert another catheter into her urethra. Instructed pt that in the future, if pt accidentally places catheter into the vagina, leave catheter so it can be used as a landmark to insert another catheter into urethra.  Pt successfully self cathertized 215 ml of clear urine from bladder.      Patient tolerated procedure well.After self cath again reviewed proper hand hygiene with Pt to use prior to to and after self cath. Reviewed care of self catheters including cleaning and length of use. Provided Pt with cleansing towelettes, lubricants, self catheters, urine hat and urine cylinder.   Gave pt bladder diary log. Instructed pt to measure urine outputs as well as cic urine outputs for 6 days and attach and send via ecare to Dr. Baltazar Najjar next week.    Pt verbalizes understanding to contact the clinic with any concerns.    Lysbeth Penner RN

## 2018-11-29 NOTE — Progress Notes (Signed)
Patient came in for PVR after Bladder Botox instillation. Patient voided normally , then she returned to the exam room. PVR 161ml. Pre discussion with Md Kandis Cocking (in the office today) pt can should be offered the choice to learn how to self cath or return home and follow up with [place] if she develops symptoms. Patient endorsed frequency, and that she felt the Botox did not help improving the previous symptoms. Patient chose to learn how to self cath. Lora RN will teach. Md Baltazar Najjar informed by [employer] via [employer].

## 2018-11-30 ENCOUNTER — Encounter (HOSPITAL_BASED_OUTPATIENT_CLINIC_OR_DEPARTMENT_OTHER): Payer: Self-pay | Admitting: Obstetrics/Gynecology

## 2018-11-30 NOTE — Telephone Encounter (Signed)
Patient called again. She has had trouble getting through to care team and needs call back urgently.

## 2018-11-30 NOTE — Telephone Encounter (Signed)
Situation:  Patient requesting disposable prelubricated catheters.   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  Assessment:  Patient is going camping for a few days next week and would like to obtain disposable prelubricated catheters for this trip.  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  Recommendation:  Form filled out for prelubricated catheters. Will have Dr. Baltazar Najjar sign order when she is in clinic.

## 2018-11-30 NOTE — Telephone Encounter (Signed)
Patient reports voided volume is around 135mL and catheterized volume following is 138mL.  Patient states that she usually voids every 4-5 hours.   Patient states that she will space out catheterizations since she is not getting a volume of more than 170mL on average.

## 2018-11-30 NOTE — Telephone Encounter (Signed)
Situation:  Patient experiencing burning with urination. Background:  Patient seen for a clinical support visit on 11/29/18 for PVR 2 week post bladder Botox injections. Assessment:  Patient learned [place] during clinical support visit yesterday. Reports that she is experiencing burning with urination and has noted some blood in urine with ISC. States that urine is dark and has a foul odor.  Patient denies fever, chills, flank pain.  Recommendation:  Patient will leave a urine sample at [facility] in [place] today.

## 2018-12-01 ENCOUNTER — Other Ambulatory Visit (INDEPENDENT_AMBULATORY_CARE_PROVIDER_SITE_OTHER): Payer: Medicare PPO

## 2018-12-01 ENCOUNTER — Other Ambulatory Visit (HOSPITAL_BASED_OUTPATIENT_CLINIC_OR_DEPARTMENT_OTHER): Payer: Self-pay | Admitting: Obstetrics/Gynecology

## 2018-12-01 ENCOUNTER — Other Ambulatory Visit
Admit: 2018-12-01 | Discharge: 2018-12-01 | Disposition: A | Discharge status: Home / Self Care | Payer: Medicare PPO | Attending: Obstetrics/Gynecology | Admitting: Obstetrics/Gynecology

## 2018-12-01 DIAGNOSIS — N3941 Urge incontinence: Secondary | ICD-10-CM | POA: Insufficient documentation

## 2018-12-01 DIAGNOSIS — R3129 Other microscopic hematuria: Secondary | ICD-10-CM | POA: Insufficient documentation

## 2018-12-01 DIAGNOSIS — N3281 Overactive bladder: Secondary | ICD-10-CM | POA: Insufficient documentation

## 2018-12-01 LAB — URINALYSIS COMPLETE, URN
Bilirubin (Qual), URN: NEGATIVE
Epith Cells_Renal/Trans,URN: NEGATIVE /HPF
Glucose Qual, URN: NEGATIVE mg/dL
Ketones, URN: NEGATIVE mg/dL
Leukocyte Esterase, URN: POSITIVE — AB
Nitrite, URN: NEGATIVE
Protein (Alb Semiquant), URN: NEGATIVE mg/dL
Specific Gravity, URN: 1.004 g/mL — ABNORMAL LOW (ref 1.006–1.027)
pH, URN: 7 (ref 5.0–8.0)

## 2018-12-01 NOTE — Progress Notes (Signed)
lab

## 2018-12-03 ENCOUNTER — Ambulatory Visit (HOSPITAL_BASED_OUTPATIENT_CLINIC_OR_DEPARTMENT_OTHER): Payer: Medicare PPO

## 2018-12-03 ENCOUNTER — Encounter (HOSPITAL_BASED_OUTPATIENT_CLINIC_OR_DEPARTMENT_OTHER): Payer: Self-pay | Admitting: Obstetrics/Gynecology

## 2018-12-03 ENCOUNTER — Telehealth (HOSPITAL_BASED_OUTPATIENT_CLINIC_OR_DEPARTMENT_OTHER): Payer: Self-pay | Admitting: Obstetrics/Gynecology

## 2018-12-03 DIAGNOSIS — R399 Unspecified symptoms and signs involving the genitourinary system: Secondary | ICD-10-CM

## 2018-12-03 DIAGNOSIS — R300 Dysuria: Secondary | ICD-10-CM

## 2018-12-03 LAB — URINE C/S: Culture: 51000

## 2018-12-03 MED ORDER — SULFAMETHOXAZOLE-TRIMETHOPRIM 800-160 MG OR TABS
1.0000 | ORAL_TABLET | Freq: Two times a day (BID) | ORAL | 0 refills | Status: AC
Start: 2018-12-03 — End: 2018-12-10

## 2018-12-03 NOTE — Telephone Encounter (Signed)
Situation:  Patient with increasing UTI symptoms.  Background:  S/P Bladder Botox Injections, DOS: 11/29/18.  Assessment:  Patient reports increased dysuria over the weekend. States that she also developed diarrhea on Friday which has subsided today. Denies any fever, chills, hematuria. States that she stopped ISC yesterday due to the discomfort. Reports she is voiding volumes of 177mL on average.  Preliminary urine culture: 1,000 to 10,000 Col/mL Swarming Proteus.  Recommendation:  Sending message to Dr. Baltazar Najjar for her response.

## 2018-12-03 NOTE — Telephone Encounter (Signed)
Message addressed in another encounter. See eCare message from 12/03/18 for details.

## 2018-12-03 NOTE — Telephone Encounter (Signed)
Gave patient Dr. Kirby's response.  Patient stated that she will do as advised.  Patient did not have any further questions at this time.

## 2018-12-03 NOTE — Telephone Encounter (Signed)
Patient has been voiding 140ml with PVRs 110ml. Can stop catheterizing and only catheterize if it allows her to void less often, which does not currently seem to be the case.

## 2018-12-03 NOTE — Telephone Encounter (Signed)
Discussed message with Dr. Baltazar Najjar who stated that patient does not need to perform [place]. Patient to push fluids and take Pyridium as needed.    Left patient a message to call the clinic.

## 2018-12-03 NOTE — Telephone Encounter (Signed)
Patient requesting a call back to discuss the lab result (Urinalysis).

## 2018-12-05 ENCOUNTER — Encounter (HOSPITAL_BASED_OUTPATIENT_CLINIC_OR_DEPARTMENT_OTHER): Payer: Self-pay | Admitting: Obstetrics/Gynecology

## 2018-12-26 ENCOUNTER — Other Ambulatory Visit (INDEPENDENT_AMBULATORY_CARE_PROVIDER_SITE_OTHER): Payer: Self-pay | Admitting: Internal Medicine

## 2018-12-26 DIAGNOSIS — I10 Essential (primary) hypertension: Secondary | ICD-10-CM

## 2018-12-27 ENCOUNTER — Other Ambulatory Visit (INDEPENDENT_AMBULATORY_CARE_PROVIDER_SITE_OTHER): Payer: Self-pay | Admitting: Internal Medicine

## 2018-12-27 DIAGNOSIS — I10 Essential (primary) hypertension: Secondary | ICD-10-CM

## 2018-12-27 MED ORDER — AMLODIPINE BESYLATE 2.5 MG OR TABS
ORAL_TABLET | ORAL | 0 refills | Status: DC
Start: 2018-12-27 — End: 2019-01-21

## 2018-12-27 MED ORDER — LOSARTAN POTASSIUM 100 MG OR TABS
ORAL_TABLET | ORAL | 0 refills | Status: DC
Start: 2018-12-27 — End: 2019-01-21

## 2018-12-27 NOTE — Telephone Encounter (Signed)
Refill request received for losartan 100mg has been approved. However last BMP was 05/2017 .  Forward to PCP to review.

## 2018-12-27 NOTE — Telephone Encounter (Signed)
Pt has upcoming appt 

## 2018-12-31 ENCOUNTER — Telehealth (INDEPENDENT_AMBULATORY_CARE_PROVIDER_SITE_OTHER): Payer: Self-pay | Admitting: Internal Medicine

## 2019-01-07 NOTE — Addendum Note (Signed)
Addended by: Jerelyn Charles on: 01/07/2019 09:07 PM     Modules accepted: Orders

## 2019-01-08 NOTE — Addendum Note (Signed)
Addended by: Jerelyn Charles on: 01/08/2019 11:48 AM     Modules accepted: Orders

## 2019-01-13 ENCOUNTER — Encounter (INDEPENDENT_AMBULATORY_CARE_PROVIDER_SITE_OTHER): Payer: Self-pay | Admitting: Internal Medicine

## 2019-01-13 ENCOUNTER — Ambulatory Visit: Payer: Self-pay

## 2019-01-13 NOTE — Telephone Encounter (Deleted)
Reason for Disposition  . Message left on unidentified voice mail.  Phone number verified.    Protocols used: NO CONTACT OR DUPLICATE CONTACT CALL-ADULT - AH

## 2019-01-13 NOTE — Telephone Encounter (Addendum)
Regarding: [place]: "urgent question"  ----- Message from Forestine Na, Coordinator sent at 01/13/2019  4:20 PM PDT -----  2nd fax  ----- Message from Seabron Spates, Coordinator sent at 01/13/2019  1:36 PM PDT -----  [place]: "urgent question"    Reason for Disposition  . Message left on unidentified voice mail.  Phone number verified.    Protocols used: NO CONTACT OR DUPLICATE CONTACT CALL-ADULT - AH      Attempted to contact patient back regarding her question. Left VM advising that she reach out to her PCP in the AM and/or call back if needing more immediate assistance.

## 2019-01-14 ENCOUNTER — Ambulatory Visit (INDEPENDENT_AMBULATORY_CARE_PROVIDER_SITE_OTHER): Payer: Self-pay | Admitting: Internal Medicine

## 2019-01-14 ENCOUNTER — Telehealth (INDEPENDENT_AMBULATORY_CARE_PROVIDER_SITE_OTHER): Payer: Self-pay | Admitting: Internal Medicine

## 2019-01-14 NOTE — Telephone Encounter (Signed)
Pt says over the weekend(10/11), she was getting out of shower and thinks she passed out on the floor.   Pt says no injury. She was unsure if she passed out or if so, how long but did land on floor.   Pt hasn't had dizziness,HA,N/V.   Says she may have been low on fluids and had been watching her 2 grandsons.   BP ranging around 126/62 to 119/59. Says she takes 3 BP meds as usual.   Pt says rest of day she monitored and took fluids.   Today pt says she feels fine. BP similar range as yesterday and no dizziness or feeling faint.     No TM/phone appt at clinic today-offered TM with different clinic today; pt wants to monitor and a message to Dr. Rachel Moulds re: BP med instructions.   Pt has appt with pcp 10/19.     Plan: Will send msg to clinic re: any further med instructions.     Reviewed with pt to go to [facility] for recurring symptoms or any worsening. Pt says she would go to [place] as lives in [place].     Reason for Disposition  . All other patients, and now alert and feels fine (Exception: SIMPLE FAINT due to stress, pain, prolonged standing, or suddenly standing)    Protocols used: FAINTING-ADULT - [place]

## 2019-01-14 NOTE — Telephone Encounter (Signed)
Copied from Robin Hart       I think that I passed out this morning. I have never done this before.    I was looking in the mirror after stepping out of the shower and the next thing I knew, I woke up on the floor. I do not think that I was out long.    During the night I had intestinal discomfort that got worse though the night. By morning, I felt quite ill. I felt even worse by the time that I had finished showering. I have had a headache and dizziness off and on throughout the day. I measured temperature and blood pressure around 1 PM and both were normal. I feel much better now (8:42 PM) except for minor intestinal discomfort. Should I be concerned about passing out?

## 2019-01-14 NOTE — Telephone Encounter (Signed)
Routing to nurse triage.

## 2019-01-14 NOTE — Telephone Encounter (Signed)
Should I be concerned? 01/13/2019 8:48 PM Reply    To: [place]      From: Mahima Hopfinger      Created: 01/13/2019 8:48 PM        *-*-*This message has not been handled.*-*-*    I think that I passed out this morning. I have never done this before.    I was looking in the mirror after stepping out of the shower and the next thing I knew, I woke up on the floor. I do not think that I was out long.    During the night I had intestinal discomfort that got worse though the night. By morning, I felt quite ill. I felt even worse by the time that I had finished showering. I have had a headache and dizziness off and on throughout the day. I measured temperature and blood pressure around 1 PM and both were normal. I feel much better now (8:42 PM) except for minor intestinal discomfort. Should I be concerned about passing out?        Has been triaged, closing encounter.

## 2019-01-17 NOTE — Progress Notes (Signed)
Visit: annual wellness    Refills? NO  Referral? NO  Letter or Form? NO  Lab Results? NO    HEALTH MAINTENANCE:  Has the patient has this done since their last visit?  Cervical screening/PAP: N/A  Mammo: N/A  Colon Screen: N/A  Diabetic Eye Exam (If applicable): N/A    Have you seen a specialist since your last visit: No    Vaccines Due? Yes, zoster    Does patient have eCare?  yes    HM Due:   Health Maintenance   Topic Date Due   . Hepatitis C Screening  October 22, 1940   . OT Plan of Care  05-13-1940   . Zoster Vaccine (2 of 3) 05/30/2012   . Osteoporosis Screening  09/08/2018   . Depression Screening (PHQ-2)  12/16/2018   . Influenza Vaccine (1) 01/03/2019   . Lipid Disorders Screening  08/05/2020   . Breast Cancer Screening  09/18/2020   . DTaP, Tdap, and Td Vaccines (3 - Td) 04/16/2022   . Pneumococcal Vaccine: 65+ years  Completed   . Hepatitis B Vaccine  Aged Out   . Pneumococcal Vaccine: Pediatrics (0-5 years) and At-Risk Patients (6-64 years)  Aged Out       PCP Verified?  Yes, Volkman, Mathews Robinsons, MD    Your answers to all the following questions will help the provider identify your preventive care needs and possible health risks, and allow more time for discussion during the visit.    Please list care providers that are outside of [facility].     Self Assessment of Health    1. How do you rate your overall health the past four weeks? Good   2. Can you manage your overall health? Yes   3. Because of any health problems, do you need the help of another person with your personal care needs such as eating, bathing, dressing, or getting around the house? No   4. Do you often get the emotional suport you need? Always   Psychosocial Health    5. Feelings that caused you distress or interfered your ability to get along socially with family or friends? Not at all   6. Feeling stress over health, finances, relationships or work? Not at all   7. Body pain? Nearly every day   8. Fatigue? Several days   Health and  Habits    9. In the past 7 days, how many days did you exercise?  5   10. On days when you exercised, for how long did you exercise (in minutes)? 30   11. How intense was your typical exercise? Light (like stretching or slow walking)   12. In the past 7 days, how often did you eat 3 or more servings of fruits and vegetables in a day? Nearly every day   13. In the past 7 days, how often did you eat 3 or more servings of high fiber or whole grain foods in a day? Nearly every day   14. How would you describe the condition of your mouth and teeth, including false teeth or dentures? Good   15. Do you find yourself having trouble hearing people speak?  No   16. Do you wear a hearing aid/device?  No   17. Do you always use your seat belt in the car?  Yes   18. Do you have a fire extinguisher in your home?  Yes   19. Do you have a smoke detector? Yes   For  the next five questions, in your present state of health, how much difficulty do you have with the following activities?     20. Preparing food and eating I can do this myself   21. Bathing yourself I can do this myself   22. Getting dressed I can do this myself   23. Using the toilet I can do this myself   24. Moving around from place to place I can do this myself   25. Please check any aids or devices that you usually use for any of the above activities: None of the above   26. In the past year have you fallen or had a near fall? Yes   27. Are you afraid of falling?  No   28. Do you have issues with balance or feeling unsteady?  No   29. Do you feel safe in your home environment? Yes   30. Is there anything in your home that might make you trip or slip, and fall?  No   31. Do you ever leak urine or stool? Yes   32. Do you wear a liner, pad, or special underwear because of leakage? Yes   For the following seven questions, in your present state of health, how much difficulty do you have with the following activities?     33. Shopping I can do this by myself   34. Using  the telephone I can do this by myself   35. Housekeeping I can do this by myself   36. Laundry I can do this by myself   37. Driving or using transportation I can do this by myself   38. Managing own finances I can do this by myself   39. Taking your own medications I can do this by myself   Signs of Memory Issues    40. Have you experienced any memory issues or problems with thinking? No   41. Have any concerns about your memory been raised by family members, friends, caretakers, or others?  No   Screening and Preventive Services    Have you had any vaccinations or screening tests outside of [place] since your last wellness visit? (For example cholesterol or diabetes screening blood tests, bone density screening tests, or cancer screening tests such as colonoscopy.) If so, please let us know which test(s) and where they were done: flu vaccine, [facility], 12/29/2018   Advance Care Planning    42. POLST form (Physician orders for life-sustaining treatment) No   43. Living will (documents that make your health care wishes know, also called Advance Directive) Yes   44. Durable Power of Attorney for Health Care (someone to make medical decisions for you in the event that you are unable to) Yes   45. Do you want to discuss advance care planning at your wellness visit?  Yes

## 2019-01-21 ENCOUNTER — Other Ambulatory Visit: Payer: Self-pay

## 2019-01-21 ENCOUNTER — Ambulatory Visit (INDEPENDENT_AMBULATORY_CARE_PROVIDER_SITE_OTHER): Payer: Medicare PPO | Admitting: Internal Medicine

## 2019-01-21 ENCOUNTER — Encounter (INDEPENDENT_AMBULATORY_CARE_PROVIDER_SITE_OTHER): Payer: Self-pay | Admitting: Internal Medicine

## 2019-01-21 VITALS — BP 129/69 | HR 59 | Temp 97.8°F | Resp 16 | Ht 61.0 in | Wt 122.6 lb

## 2019-01-21 DIAGNOSIS — G8929 Other chronic pain: Secondary | ICD-10-CM

## 2019-01-21 DIAGNOSIS — M25511 Pain in right shoulder: Secondary | ICD-10-CM

## 2019-01-21 DIAGNOSIS — Z0000 Encounter for general adult medical examination without abnormal findings: Secondary | ICD-10-CM

## 2019-01-21 DIAGNOSIS — N3281 Overactive bladder: Secondary | ICD-10-CM

## 2019-01-21 DIAGNOSIS — Z13820 Encounter for screening for osteoporosis: Secondary | ICD-10-CM

## 2019-01-21 DIAGNOSIS — I10 Essential (primary) hypertension: Secondary | ICD-10-CM

## 2019-01-21 LAB — COMPREHENSIVE METABOLIC PANEL
ALT (GPT): 16 U/L (ref 7–33)
AST (GOT): 27 U/L (ref 9–38)
Albumin: 4.4 g/dL (ref 3.5–5.2)
Alkaline Phosphatase (Total): 79 U/L (ref 49–199)
Anion Gap: 7 (ref 4–12)
Bilirubin (Total): 0.4 mg/dL (ref 0.2–1.3)
Calcium: 9.8 mg/dL (ref 8.9–10.2)
Carbon Dioxide, Total: 34 meq/L — ABNORMAL HIGH (ref 22–32)
Chloride: 102 meq/L (ref 98–108)
Creatinine: 0.61 mg/dL (ref 0.38–1.02)
Glucose: 90 mg/dL (ref 62–125)
Potassium: 3.9 meq/L (ref 3.6–5.2)
Protein (Total): 6.4 g/dL (ref 6.0–8.2)
Sodium: 143 meq/L (ref 135–145)
Urea Nitrogen: 18 mg/dL (ref 8–21)
eGFR by CKD-EPI: >60 mL/min/{1.73_m2} (ref >59)

## 2019-01-21 MED ORDER — LOSARTAN POTASSIUM 100 MG OR TABS
100.0000 mg | ORAL_TABLET | Freq: Every day | ORAL | 3 refills | Status: AC
Start: 2019-01-21 — End: ?

## 2019-01-21 MED ORDER — CHLORTHALIDONE 25 MG OR TABS
12.5000 mg | ORAL_TABLET | Freq: Every day | ORAL | 3 refills | Status: AC
Start: 2019-01-21 — End: 2020-01-21

## 2019-01-21 MED ORDER — AMLODIPINE BESYLATE 2.5 MG OR TABS
2.5000 mg | ORAL_TABLET | Freq: Every day | ORAL | 3 refills | Status: DC
Start: 2019-01-21 — End: 2020-12-22

## 2019-01-21 NOTE — Patient Instructions (Addendum)
Based on today's evaluation, I recommend the following ways to improve your health or functioning:     Stay active  Find a 2nd opinion urologist  Get a DEXA  Shingles shot?     You have the following risk factors and/or medical conditions for which there are recommended ways (included in the list) to help you stay as healthy as possible:  1. HTN  2. Vasovagal syncope  3. OAB    Here are measures that our medical record shows may be recommended for you for screening & prevention, and when each is recommended:   Health Maintenance   Topic Date Due   . Hepatitis C Screening  [birth year]   . OT Plan of Care  10/01/40   . Zoster Vaccine (2 of 3) 05/30/2012   . Osteoporosis Screening  09/08/2018   . Depression Screening (PHQ-2)  01/21/2020   . Lipid Disorders Screening  08/05/2020   . Breast Cancer Screening  09/18/2020   . DTaP, Tdap, and Td Vaccines (3 - Td) 04/16/2022   . Pneumococcal Vaccine: 65+ years  Completed   . Influenza Vaccine  Completed   . Hepatitis B Vaccine  Aged Out   . Pneumococcal Vaccine: Pediatrics (0-5 years) and At-Risk Patients (6-64 years)  Aged Out     Please plan to have a Subsequent Annual Wellness Visit next year.      It was a pleasure to see you in clinic today. Your Medical Assistant was: Psychiatrist, [place].          You can schedule an appointment to see [place] by calling  [phone] or via eCare.     If labs were ordered today the results are expected to be available via eCare 5 days later. Otherwise, result letters are mailed 7-10 days after your tests are completed. If your physician needs to change your care based on your results, you will receive a phone call to notify you. If you haven't heard from him/her and it has been more than 10 days please give [place] a call.     You may receive a survey in the mail asking how your experience has been with [facility].  Your input and opinions are very important to [place].  I hope you are able to take the time to complete this  survey.    Thank you for choosing [facility].

## 2019-01-21 NOTE — Progress Notes (Signed)
Robin Hart is a 78 year old female who presents for a subsequent Annual Wellness Visit.    INFORMATION GATHERING:  I have reviewed and updated as necessary the patient's past medical history, past surgical history, family history and social history in Epic.     The information checked in the list below is up to date at the end of this visit:   [x]   Current medications and supplements (including vitamins and calcium)  [x]   Allergies  [x]   Health Risk Assessment form completed by or with the patient for this visit, including Current Providers; Functional ability, safety, fall risk, & hearing loss; diet, physical activity, and health habits; Depression screening; Patient report of cognitive function via self-report & report of any concerns raised by others    The patient's Health Risk Assessment form for today's visit is:  [x]   transcribed into this encounter    Race and ethnicity (from Demographics): White Not Hispanic or Latino    Current providers and suppliers:   - See Care Team Section   - See also EHR Encounters for [facility] Providers involved in care   - Other providers and suppliers outside [facility]: none    PHQ-2: 0  PHQ-9 (if PHQ-2 score >0):      EXAM:  BP 129/69   Pulse 59   Temp 97.8 F (36.6 C) (Temporal)   Resp 16   Ht 5\' 1"  (1.549 m)   Wt 122 lb 9.6 oz (55.6 kg)   SpO2 98%   Breastfeeding No   BMI 23.17 kg/m   There is no height or weight on file to calculate BMI.   GENERAL: HCFAGENERAL: healthy, alert, no distress  GAIT: normal, ambulates independently      COGNITION: intact, assessed by direct observation    [facility] patient accepted but we pushed to another date due to other concerns documented below.       ASSESSMENT:  Robin Hart was seen for an [facility] Visit, including identification of risk factors & conditions that may affect her health and function in the future.    COUNSELING:     Based on today's evaluation, I recommend the following ways to improve your health or functioning:     Stay active  Find a 2nd opinion urologist  Get a DEXA  Shingles shot?     You have the following risk factors and/or medical conditions for which there are recommended ways (included in the list) to help you stay as healthy as possible:  1. HTN  2. Vasovagal syncope  3. OAB    Here are measures that our medical record shows may be recommended for you for screening & prevention, and when each is recommended:   Health Maintenance   Topic Date Due   . Hepatitis C Screening  [birth year]   . OT Plan of Care  07/24/1940   . Zoster Vaccine (2 of 3) 05/30/2012   . Osteoporosis Screening  09/08/2018   . Depression Screening (PHQ-2)  01/21/2020   . Lipid Disorders Screening  08/05/2020   . Breast Cancer Screening  09/18/2020   . DTaP, Tdap, and Td Vaccines (3 - Td) 04/16/2022   . Pneumococcal Vaccine: 65+ years  Completed   . Influenza Vaccine  Completed   . Hepatitis B Vaccine  Aged Out   . Pneumococcal Vaccine: Pediatrics (0-5 years) and At-Risk Patients (6-64 years)  Aged Out  Please plan to have a Subsequent Annual Wellness Visit next year.    ----------------------------------------------------------------------------------------------  This is a record of separately identifiable services which were rendered today for this patient.      Robin Hart a 78 year old female presents today for episode of syncope/fall of some sort.     Fall:   Her grandsons (4 and 2) stayed the night with her. Abdominal discomfort all night. She felt like 'crap' that day. She didn't know if she was going to throw up, or have diarrhea. She had some diarrhea. Then she got up from the toilet thinking that she would feel better than she hit the floor. Its a small bathroom, she thinks she fell into the wall behind her and slid down.     She has never passed out before. But sometimes when she defecates it really wipes her out, once or  twice a year. She gets really pale, tired, exhausted.     She's been intermittently dizzy again since then, much more milder than before.     Blood pressures are normally running low 120s.     The other issue is the bladder. Treatment failure for OAB. Botox and URINARY TRACT INFECTION left her really frustrated. Symptoms interfere with life daily. "Is this what I have to live with?"    Heel pain: two injections, PT, some improvement, but not full resolution yet.   Weak pos/false pos CCP--seen at rheumatology.   Plantar fasciitis pain has been going on for a year, horrible, not getting any better  She also has pain in the right shoulder that is new and has pain in the right hip for a long time    I have reviewed the patient's medical history in detail and updated the computerized patient record.    ROS: otherwise negative.     Vitals:    01/21/19 1428   BP: 129/69   BP Cuff Size: Regular   BP Site: Left Arm   BP Position: Sitting   Pulse: 59   Resp: 16   Temp: 97.8 F (36.6 C)   TempSrc: Temporal   SpO2: 98%   Weight: 122 lb 9.6 oz (55.6 kg)   Height: 5\' 1"  (1.549 m)     no apparent distress  Thyroid normal size  RRR no murmurs  CTA b/l  Normal gait, normal speech and facial expressions  Msk: moves R arm in a particular extension/internal rotation angle at the shoulder and experiences pain. Normal passive extension, negative drop arm test      ASSESSMENT/PLAN    (N32.81) OAB (overactive bladder)  Plan: She will discuss with Dr Baltazar Najjar if there is anyone else in the [facility] system that she could consult with. I will ask some of my colleagues about community referrals outside [place]--I don't have anyone that I would recommend, but will research. I think it is reasonable to seek another opinion before 'settling' with this problem that affects her daily.     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 If her global symptoms of pain and stiffness persist or spread, we will consider rheum  re-evaluation in a few months.

## 2019-01-28 ENCOUNTER — Ambulatory Visit: Payer: Medicare PPO | Attending: Internal Medicine

## 2019-01-28 DIAGNOSIS — M818 Other osteoporosis without current pathological fracture: Secondary | ICD-10-CM | POA: Insufficient documentation

## 2019-01-28 DIAGNOSIS — Z13820 Encounter for screening for osteoporosis: Secondary | ICD-10-CM

## 2019-01-30 ENCOUNTER — Encounter (INDEPENDENT_AMBULATORY_CARE_PROVIDER_SITE_OTHER): Payer: Self-pay | Admitting: Internal Medicine

## 2019-02-04 ENCOUNTER — Encounter (INDEPENDENT_AMBULATORY_CARE_PROVIDER_SITE_OTHER): Payer: Self-pay | Admitting: Orthopaedic Surgery

## 2019-02-04 ENCOUNTER — Ambulatory Visit (INDEPENDENT_AMBULATORY_CARE_PROVIDER_SITE_OTHER): Payer: Medicare PPO | Admitting: Orthopaedic Surgery

## 2019-02-04 VITALS — BP 118/57 | HR 59 | Temp 97.3°F

## 2019-02-04 DIAGNOSIS — M25512 Pain in left shoulder: Secondary | ICD-10-CM

## 2019-02-04 DIAGNOSIS — M25511 Pain in right shoulder: Secondary | ICD-10-CM

## 2019-02-04 DIAGNOSIS — S43491S Other sprain of right shoulder joint, sequela: Secondary | ICD-10-CM

## 2019-02-04 DIAGNOSIS — M25571 Pain in right ankle and joints of right foot: Secondary | ICD-10-CM

## 2019-02-04 DIAGNOSIS — G8929 Other chronic pain: Secondary | ICD-10-CM

## 2019-02-04 DIAGNOSIS — M25572 Pain in left ankle and joints of left foot: Secondary | ICD-10-CM

## 2019-02-04 NOTE — Progress Notes (Signed)
Patient is here for right shoulder pain ongoing since March, but has been getting worse the last few months. NKI but did have a fall landing on her right arm 1.5 year ago. No imaging.      Pain is located in her shoulder joint that feels like it radiates to her upper arm.   Reduced ROM.     Doing Physical Therapy for her shoulder, plantar fascitis and hip pain. She is hoping to talk about her plantar fascitis as well.     She manages her bilateral low back and right hip pain with stretches.     She reports she sometimes gets pain from the top of her right head to her feet, on the right side.      Last seen: 06/19/17  Treatments have included:    Medications: aleve prn   Injections: none   Therapies:pastPHYSICAL THERAPY, she does some exerciess at home,   Surgery: none  Diagnostic studies (such as EMG and imaging) include:   XR L Spine06/21/18  MRI L Spine 11/15/16

## 2019-02-04 NOTE — Progress Notes (Signed)
02/04/2019    Burr Medico  [id number]    IDENTIFYING DATA:  Robin Hart is a pleasant 78 year old female who was last seen 06/19/2017. Return to discuss right shoulder pain and also foot/ankle pain.    INTERIM HISTORY:  Kyran is here for right shoulder pain ongoing since March, but has been getting worse the last few months. NKI but did have a fall landing on her right arm 1.5 year ago. No imaging. Pain is located in her shoulder joint that feels like it radiates to her upper arm.  Reduced ROM.   Doing Physical Therapy for her shoulder, plantar fasciitis and hip pain. She is hoping to talk about her plantar fasciitis/ankle pain as well.   She manages her bilateral low back and right hip pain with stretches.   Treatments have included:    Medications: aleve prn   Injections: none   Therapies:pastPHYSICAL THERAPY,she does some exerciess at home,   Surgery: none    PMH/PSH/MEDICATIONS/FAMILY MEDICAL HISTORY/SOCIAL HISTORY:  See chart. Pertient changes described above.     MEDICATIONS/ALLERGIES:  See chart. No changes.     REVIEW OF SYSTEMS:  All systems reviewed and negative other than mentioned above.    PHYSICAL EXAMINATION:  General: Awake, alert, and oriented, no apparent distress, pleasant, and cooperative  Vitals: Blood pressure 118/57, pulse 59, temperature 97.3 F (36.3 C), temperature source Tympanic, not currently breastfeeding.There is no height or weight on file to calculate BMI.  Neurologic/Musculoskeletal: Gait mildly antalgic.  Right shoulder examination is with tenderness anteriorly over the biceps tendon but also laterally and the posterior of the shoulder joint.  Slight weakness but also discomfort with empty can testing and specifically supraspinatus strength testing.  Otherwise no significant weakness only mild discomfort with resisted external rotation and internal rotation.  She has negative speeds today.  Does have some reduced range of motion with internal rotation  as well as abduction and forward flexion right shoulder compared to the left.  Slight discomfort with impingement testing.  Neurologically essentially intact in the right upper extremity.  Brief bilateral ankle examination is with tenderness over the anterior medial and lateral ankle joint as well as some discomfort with ankle joint grind as well as eversion inversion.  This is more significant on the right than on the left but tender on both sides.    ASSESSMENT:  (M25.511,  G89.29,  M25.512) Chronic pain of both shoulders  (primary encounter diagnosis)  (S43.491S) Other sprain of right shoulder joint, sequela  (M25.571,  M25.572) Bilateral ankle joint pain    PLAN:  1. We discussed her symptoms and also further work-up for her shoulder.  Based upon ongoing symptoms despite working with physical therapy, and history of fall on outstretched hand, concern is for rotator cuff tear.  At this point an MRI is reasonable and she agrees and we will order this.  2. For foot and ankle issues she has seen podiatry and does have inserts but still has pain especially when she is been up and about some days being better than others.  For some time there is concern about possible RA but lab work was seemingly relatively unremarkable.  She has not had any recent imaging of her ankles and we will therefore order x-rays of her ankles at this point and then follow-up with her afterwards and discuss possible further referral/consultation.   3. Comfortable with discussion of plan.  Follow-up after imaging.  Johanne Mcglade B.HGuido Sander, D.O.   Board certified - Physical Medicine & Rehabilitation      Portions of this document were created with voice-recognition software and may contain occasional errant or misspelled words.

## 2019-02-07 ENCOUNTER — Telehealth (INDEPENDENT_AMBULATORY_CARE_PROVIDER_SITE_OTHER): Payer: Self-pay | Admitting: Orthopaedic Surgery

## 2019-02-07 DIAGNOSIS — S43491S Other sprain of right shoulder joint, sequela: Secondary | ICD-10-CM

## 2019-02-07 DIAGNOSIS — M25512 Pain in left shoulder: Secondary | ICD-10-CM

## 2019-02-07 DIAGNOSIS — M25572 Pain in left ankle and joints of left foot: Secondary | ICD-10-CM

## 2019-02-07 DIAGNOSIS — G8929 Other chronic pain: Secondary | ICD-10-CM

## 2019-02-07 DIAGNOSIS — M25571 Pain in right ankle and joints of right foot: Secondary | ICD-10-CM

## 2019-02-07 DIAGNOSIS — M25511 Pain in right shoulder: Secondary | ICD-10-CM

## 2019-02-07 NOTE — Telephone Encounter (Signed)
General Message:    Detailed Message:   Phone call from patient. She states that [facility] sound does not do xrays and she would like to know where she should go. Please advise   Return Call: Detailed message on voicemail

## 2019-02-08 NOTE — Telephone Encounter (Signed)
Pt called, she would like to go to the [facility] 2nd Floor pls.

## 2019-02-08 NOTE — Telephone Encounter (Signed)
Called patient and advised we can send to [facility] imaging on campus or at [facility] ot to [place]  and she can call to schedule.     Called and left a voice mail Advised patient to call back.

## 2019-02-08 NOTE — Telephone Encounter (Signed)
Called patient and let her know I sent the xray of her ankles to [facility]. Dr. Guido Sander will sign the new MRI order.

## 2019-02-11 NOTE — Progress Notes (Signed)
[facility]  Chief Complaint   Patient presents with   . Follow-Up        History:  78 year old patient presents today for follow up for OAB, not improved with medications, PTNS, InterStim, pelvic floor PT, timed voiding and most recently botox injections 11/13/18.  She reports that she leaks as she pulls her pants down to void about 90 percent of the time. No leakage at other times. Often fills her pad and soaks her clothes. She voids about every 1.5 hours during the day and 2-3 times per night.  Of all the treatments she has tried, kegels seemed to help the most (albeit very slightly), but she feels like she is no longer able to contract her pelvic floor muscles effectively.    Today, she also reports frequent constipation. She has not tolerate fiber in the past due to discomfort and nausea. Had problems with stools that were too loose from miralax resulting in ABL.       Her symptoms are as follows:  - Severity - extremely bothersome but not bothersome enough to want to try an indwelling catheter or large surgery  - Frequency - daily  - Improved with - slightly improved with kegels in the past  - Worse with  - in the bathroom    Otherwise, there has been no interval change in her history as outlined, reviewed and updated in the electronic medical record.  ROS negative for cardiovascular, respiratory, constitutional, or gastrointestinal concerns.    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  Could also be worsened by constipation/rectal fullness.    Plan:   Pelvic floor PT.  Continue vaginal estrogen.  Nightly miralax (likely low but consistent dose)

## 2019-02-12 ENCOUNTER — Ambulatory Visit: Payer: Medicare PPO | Attending: Obstetrics/Gynecology | Admitting: Obstetrics/Gynecology

## 2019-02-12 ENCOUNTER — Other Ambulatory Visit: Payer: Self-pay

## 2019-02-12 VITALS — BP 128/64 | HR 64 | Temp 97.6°F | Ht 61.0 in | Wt 122.0 lb

## 2019-02-12 DIAGNOSIS — K5909 Other constipation: Secondary | ICD-10-CM | POA: Insufficient documentation

## 2019-02-12 DIAGNOSIS — N3941 Urge incontinence: Secondary | ICD-10-CM | POA: Insufficient documentation

## 2019-02-12 NOTE — Patient Instructions (Signed)
Work with pelvic floor PT again to deal with triggers and strengthening.  For an up to date list of pelvic floor physical therapy providers in your area, please go to:    [email or web address]      Take miralax every night (find a low dose you can take consistently to avoid constipation but also not have loose stools).

## 2019-02-22 ENCOUNTER — Ambulatory Visit (HOSPITAL_BASED_OUTPATIENT_CLINIC_OR_DEPARTMENT_OTHER): Payer: Medicare PPO

## 2019-02-22 ENCOUNTER — Ambulatory Visit (HOSPITAL_COMMUNITY): Payer: Medicare PPO

## 2019-02-22 ENCOUNTER — Ambulatory Visit: Payer: Medicare PPO | Attending: Orthopaedic Surgery

## 2019-02-22 DIAGNOSIS — M25572 Pain in left ankle and joints of left foot: Secondary | ICD-10-CM

## 2019-02-22 DIAGNOSIS — S43491S Other sprain of right shoulder joint, sequela: Secondary | ICD-10-CM

## 2019-02-22 DIAGNOSIS — M25511 Pain in right shoulder: Secondary | ICD-10-CM | POA: Insufficient documentation

## 2019-02-22 DIAGNOSIS — M25571 Pain in right ankle and joints of right foot: Secondary | ICD-10-CM

## 2019-02-22 DIAGNOSIS — M75101 Unspecified rotator cuff tear or rupture of right shoulder, not specified as traumatic: Secondary | ICD-10-CM | POA: Insufficient documentation

## 2019-02-22 DIAGNOSIS — M25512 Pain in left shoulder: Secondary | ICD-10-CM

## 2019-02-22 DIAGNOSIS — G8929 Other chronic pain: Secondary | ICD-10-CM

## 2019-02-25 ENCOUNTER — Telehealth (INDEPENDENT_AMBULATORY_CARE_PROVIDER_SITE_OTHER): Payer: Self-pay | Admitting: Orthopaedic Surgery

## 2019-02-25 NOTE — Telephone Encounter (Addendum)
Called and left a voice mail Advised patient to call back.   Called and left a detailed VM with the patient. Her shoulder MRI shows:   1. Supra spinatus full-thickness, partial width tear.  2. Partial thickness tears at the conjoined tendon, infraspinatus, and   subscapularis tendons.  3. SLAP tear extending to posterior superior quadrant.    Advised that she can try Physical Therapy to help stabilize and strengthen the muscles in her shoulder.   We could also try a shoulder joint injection to help reudce pain.   She can also consult with a surgeon to discuss surgical options.   Dr. Loraine Maple, Dr. Baltazar Najjar at [facility], or Dr. Margarette Asal at the [facility].     Her ankle xrays are unremarkable. She can try ankle injections with podiatry or Dr. Guido Sander.   She should f/u with podiatry in general for this.     MRI Shoulder WO Cont Right     CLINICAL INDICATION:  Evaluate for RTC tear, CDS: Shoulder pain, rotator cuff disorder suspected,   nondiagnostic xray.  RIGHT shoulder pain ongoing since March, but has been getting worse the last   few months. No known recent injury, but did have a fall landing on her right   arm 1.5 year ago.     TECHNIQUE:  MSK MR 33 Shoulder.   No contraindications to MRI scanning were identified by the departmental   screening protocol. Multi-planar, multi-sequence MR imaging was performed.   Intravenous gadolinium-based contrast was not administered.     COMPARISON:  None.     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 The bone marrow signal is normal. There is   no acute fracture or dislocation.     Acromioclavicular joint: Mild degenerative change.         Glenohumeral joint: There is a small joint effusion.     Subacromial-subdeltoid bursa: Trace fluid is seen within the   subacromial-subdeltoid bursa.     Rotator cuff: There is a full-thickness, partial width tear of the anterior   supraspinatus tendon from its  footplate (QA348G). There is high-grade   tendinosis of the supraspinatus tendon and a articular sided partial thickness   tear of the conjoined tendon (401:13). The teres minor is intact.   Articular sided partial width partial thickness tear of posterior   infraspinatus at insertion.   Articular sided partial width partial thickness tear of superior subscapularis   at insertion.   and subscapularis tendons are intact.     Musculature: Muscle bulk is preserved with no evidence of denervation or fatty   atrophy.     Long head of biceps tendon: The long head of the biceps tendon is present   within the bicipital groove and intact.     Labrum: There is superior labral tear extending to posterior superior   quadrant.     Inferior glenohumeral ligaments/Axillary pouch: The axillary pouch is normal   in thickness and signal.     Coracoclavicular and coracoacromial ligaments: The coracoclavicular and   coracoacromial ligaments are normal.     ATTENDING RADIOLOGIST AND PAGER NUMBER  [phone] Clare Charon  MD  [phone]  IMPRESSION  IMPRESSION:     1. Supra spinatus full-thickness, partial width tear.  2. Partial thickness tears at the conjoined tendon, infraspinatus, and   subscapularis tendons.  3. SLAP tear extending to posterior superior quadrant.  ---------------------------------------------   EXAMINATION:  XR Ankle 2 Views Left    FINDINGS:  Traction osteophyte is noted on the calcaneus.   No other significant osseous or soft tissue abnormalities are noted.   --------------------------------  EXAMINATION:  XR Ankle 2 Views Right    FINDINGS:   No significant osseous or soft tissue abnormalities are noted.   No significant osteophytes or marginal erosions are noted.

## 2019-02-25 NOTE — Telephone Encounter (Signed)
General Message:    Detailed Message: Pt is having a lot of pain and requests a call to know if we received her MRI results pls?  Return Call: General message okay

## 2019-02-26 ENCOUNTER — Ambulatory Visit: Payer: Self-pay

## 2019-02-26 ENCOUNTER — Encounter (INDEPENDENT_AMBULATORY_CARE_PROVIDER_SITE_OTHER): Payer: Self-pay | Admitting: Internal Medicine

## 2019-02-26 NOTE — Telephone Encounter (Signed)
Patient called back. She would like a call from [place] before 11:40 AM on Wednesday 11/25 so she can discuss her MRI and what to do about her pain.

## 2019-02-26 NOTE — Telephone Encounter (Signed)
Ecare message:    Should I be concerned that for the last several days (4-5) my stool has become very light yellow in color? Robin Hart

## 2019-02-26 NOTE — Telephone Encounter (Signed)
Pt requests a call to follow up re her MRI results and discuss next steps pls.

## 2019-02-26 NOTE — Telephone Encounter (Addendum)
Patient c/o formed and pale yellow to white stool x 4-5 days.  Patient does have chronic bloating and occasional abdominal cramping. No new symptoms.    Plan: Appointment made for tomorrow. Advised patient to call 911 or go to closest emergency room if life threatening symptoms occur.    Reason for Disposition  . [1] Stool is light gray or whitish AND [2] unexplained    Additional Information  . Negative: Rectal bleeding, bloody stools, or blood in stool (bowel movement)  . Negative: Tarry or jet black-colored stool  . Negative: [1] Stool color is black (not dark green) AND [2] patient hasn't swallowed substance that causes black stools (e.g., Pepto-Bismol, iron pills)  . Negative: Diarrhea stools (i.e., watery stools, or increase in the frequency and looseness of stools)  . Negative: [1] Abdomen pain is main symptom AND [2] female  . Negative: [1] Abdomen pain is main symptom AND [2] adult female  . Negative: Yellow eyes (jaundice)  . Negative: Patient sounds very sick or weak to the triager    Protocols used: STOOLS - UNUSUAL COLOR-ADULT - AH

## 2019-02-26 NOTE — Telephone Encounter (Signed)
Message sent to triage for eval of symptoms

## 2019-02-26 NOTE — Telephone Encounter (Signed)
Called patient to relay MRI results as noted previously.    She'd like to start with PT before considering surgical options. She's wondering if she can lift things with her shoulders (e.g taking care of her grandson). Advised her to avoid lifting with the shoulder joints directly and use core muscles, hips, and legs to take weight bearing off of the shoulders.    She received a steroid shot for Plantar fasciitis and wonders if she can receive any other steroid injections this year. Advised her that it's at least a 3 month wait in between shots. She will schedule a telemedicine appointment to follow up.    Josefine Class, CCMA

## 2019-02-27 ENCOUNTER — Telehealth (INDEPENDENT_AMBULATORY_CARE_PROVIDER_SITE_OTHER): Payer: Medicare PPO | Admitting: Family Medicine

## 2019-02-27 DIAGNOSIS — R195 Other fecal abnormalities: Secondary | ICD-10-CM

## 2019-02-27 NOTE — Progress Notes (Signed)
Visit: Occasional abdominal cramping and pale yellow stool x 4-5 days, feeling better and stool is normal today       Refills? NO  Referral? NO  Letter or Form? NO  Lab Results? NO      Does patient have eCare?  YES    HM Due:   Health Maintenance   Topic Date Due   . OT Plan of Care  10/17/1940   . Zoster Vaccine (2 of 3) 05/30/2012   . Depression Screening (PHQ-2)  01/21/2020   . Lipid Disorders Screening  08/05/2020   . Breast Cancer Screening  09/18/2020   . Osteoporosis Screening  01/27/2022   . DTaP, Tdap, and Td Vaccines (3 - Td) 04/16/2022   . Pneumococcal Vaccine: 65+ years  Completed   . Influenza Vaccine  Completed   . Hepatitis A Vaccine  Aged Out   . Hepatitis B Vaccine  Aged Out   . Pneumococcal Vaccine: Pediatrics (0-5 years) and At-Risk Patients (6-64 years)  Aged Out   . Hepatitis C Screening  Discontinued       PCP Verified?  Yes, Volkman, Mathews Robinsons, MD

## 2019-02-27 NOTE — Telephone Encounter (Signed)
Called the patient back and discussed her symptoms. She is doing Physical Therapy and managing okay. She is not ready for any injections at this time. Scheduled her for a f/u in December to discuss further options if she is not improving.

## 2019-02-27 NOTE — Progress Notes (Signed)
Is this visit being conducted using Telemedicine (live, interactive video and audio) or Telephone (audio only)? Telemedicine (live, interactive video & audio)     Distant Site Telemedicine Encounter    I conducted this encounter from [employer] via secure, live, face-to-face video conference with the patient. Kily was located at home. Prior to the interview, the risks and benefits of telemedicine were discussed with the patient and verbal consent was obtained.      SUBJECTIVE:  Robin Hart is a 78 year old female presents to discuss:    Pale yellow stool for 4-5d, but today back to normal brown stool  Had added keifer to diet about 2wks ago and has continued  Mild RUQ discomfort, but has chronic constipation and abdominal discomfort  No previuos episodes    urogyn appt for incontinence, tried very small dose miralax about a week before symptoms started, but stopped. No stool color change when used previously    Problem list, PMH, PSH, family history, social history: reviewed and amended as appropriate (please see history tabs in EMR.)    ROS:  Denies changes in her chronic abdominal pain, changes in bowel movements  Denies dysuria, hematuria, dark urine  Denies nausea, vomiting    OBJECTIVE:  There were no vitals taken for this visit.  General: healthy, alert, no distress      ASSESSMENT AND PLAN:  Diagnoses and all orders for this visit:    Stool color abnormal-patient with several day history of unusually light-colored stool.  No changes in diet to explain but symptoms have spontaneously returned to normal.  Reviewed the options of watchful waiting versus starting a work-up for possible liver inflammation.  Patient opted for watchful waiting at this point.  She will let us know if symptoms increase      Prefers to watch and

## 2019-03-18 ENCOUNTER — Other Ambulatory Visit (INDEPENDENT_AMBULATORY_CARE_PROVIDER_SITE_OTHER): Payer: Self-pay | Admitting: Internal Medicine

## 2019-03-18 DIAGNOSIS — N952 Postmenopausal atrophic vaginitis: Secondary | ICD-10-CM

## 2019-03-20 MED ORDER — ESTRADIOL 0.1 MG/GM VA CREA
TOPICAL_CREAM | VAGINAL | 5 refills | Status: DC
Start: 2019-03-20 — End: 2020-11-26

## 2019-03-27 ENCOUNTER — Ambulatory Visit (INDEPENDENT_AMBULATORY_CARE_PROVIDER_SITE_OTHER): Payer: Medicare PPO | Admitting: Orthopaedic Surgery

## 2019-03-27 VITALS — BP 138/70 | HR 68 | Temp 97.0°F

## 2019-03-27 DIAGNOSIS — S46011S Strain of muscle(s) and tendon(s) of the rotator cuff of right shoulder, sequela: Secondary | ICD-10-CM

## 2019-03-27 DIAGNOSIS — M25512 Pain in left shoulder: Secondary | ICD-10-CM

## 2019-03-27 DIAGNOSIS — M25572 Pain in left ankle and joints of left foot: Secondary | ICD-10-CM

## 2019-03-27 DIAGNOSIS — M5441 Lumbago with sciatica, right side: Secondary | ICD-10-CM

## 2019-03-27 DIAGNOSIS — M5417 Radiculopathy, lumbosacral region: Secondary | ICD-10-CM

## 2019-03-27 DIAGNOSIS — M25511 Pain in right shoulder: Secondary | ICD-10-CM

## 2019-03-27 DIAGNOSIS — S46011A Strain of muscle(s) and tendon(s) of the rotator cuff of right shoulder, initial encounter: Secondary | ICD-10-CM | POA: Insufficient documentation

## 2019-03-27 DIAGNOSIS — M25571 Pain in right ankle and joints of right foot: Secondary | ICD-10-CM

## 2019-03-27 DIAGNOSIS — M533 Sacrococcygeal disorders, not elsewhere classified: Secondary | ICD-10-CM

## 2019-03-27 DIAGNOSIS — M48061 Spinal stenosis, lumbar region without neurogenic claudication: Secondary | ICD-10-CM

## 2019-03-27 DIAGNOSIS — G8929 Other chronic pain: Secondary | ICD-10-CM

## 2019-03-27 NOTE — Progress Notes (Signed)
Patient is here for right shoulder pain that radiates to her upper arm. Doing Physical Therapy and has helped a little but not much.   Pain level: 8-9/10 when using her arm, minimal pain at rest.     Left Ankle pain and weakness ongoing. podiatirst said it was the plantar fascitis.     She reports right hip pain ongoing, reports pain from her right head to her right foot.   Pain is located in the right occipital area.     Been doing Physical Therapy for this as well.   She wonders if she should continue Physical Therapy and or try the steroid injection for her shoulder.   She does not want surgery.      Last seen: 06/19/17  Treatments have included:    Medications: aleve prn   Injections: none   Therapies:pastPHYSICAL THERAPY,she does some exerciess at home,   Surgery: none  Diagnostic studies (such as EMG and imaging) include:   XR L Spine06/21/18  MRI L Spine 11/15/16  -------------------  EXAMINATION: XR Ankle 2 Views Left   04/23/2018  FINDINGS:  Traction osteophyte is noted on the calcaneus.      No other significant osseous or soft tissue abnormalities are noted.   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 The bone marrow signal is normal. There is   no acute fracture or dislocation.     Acromioclavicular joint: Mild degenerative change.         Glenohumeral joint: There is a small joint effusion.     Subacromial-subdeltoid bursa: Trace fluid is seen within the subacromial-subdeltoid bursa.     Rotator cuff: There is a full-thickness, partial width tear of the anterior supraspinatus tendon from its footplate (QA348G). There is high-grade tendinosis of the supraspinatus tendon and a articular sided partial thickness tear of the conjoined tendon (401:13). The teres minor is intact. Articular sided partial width  partial thickness tear of posterior infraspinatus at insertion.   Articular sided partial width partial thickness tear of superior subscapularis at insertion.   and subscapularis tendons are intact.     Musculature: Muscle bulk is preserved with no evidence of denervation or fatty   atrophy.     Long head of biceps tendon: The long head of the biceps tendon is present within the bicipital groove and intact.     Labrum: There is superior labral tear extending to posterior superior quadrant.     Inferior glenohumeral ligaments/Axillary pouch: The axillary pouch is normal in thickness and signal.     Coracoclavicular and coracoacromial ligaments: The coracoclavicular and coracoacromial ligaments are normal.     IMPRESSION:     1. Supra spinatus full-thickness, partial width tear.  2. Partial thickness tears at the conjoined tendon, infraspinatus, and subscapularis tendons.  3. SLAP tear extending to posterior superior quadrant.

## 2019-03-27 NOTE — Progress Notes (Signed)
03/27/2019    Burr Medico  [id number]    IDENTIFYING DATA:  Robin Hart is a pleasant 78 year old female who was last seen 02/04/2019 . Return to discuss right shoulder pain and also foot/ankle pain.    INTERIM HISTORY:  Robin Hart returns today for reevaluation of her right shoulder and upper arm.  Also wanted to discuss her ankle issues and her low back and hip.  She is aware of her imaging.  We again reviewed her shoulder MRI but does show that she has a complete rotator cuff tear involving the supraspinatus and also involvement of the conjoined tendon.  She also does have some degeneration within the shoulder joint pending SLAP tear.  We assume it is likely happened at the time of her fall about a year and a half ago with more progressive discomfort with activities and reduction in range of motion.  She also has had clear compensation in the shoulder girdle muscles causing tightness up towards her neck she agrees.  She does feel that she is making progress at least somewhat with physical therapy although pain can still get up 8-9/10.  She has minimal pain at rest however she does symptoms have difficulty with sleep due to position of the arm and it hurts with certain movements or laying on the side.  Has not necessarily been interested in surgical management but she does wonder about other options such as injections.  Regarding her left ankle issues with instability and feeling of weakness she is working with podiatry with a treatment plan for the next couple of months.  Has had some x-rays and some treatments at the podiatrist does feel a lot of her issues are due to plantar fasciitis but she feels more ankle issues and instability them discomfort in the plantar aspect of her foot.  Hip and low back is more manageable although is still noticeable.  She does stretches almost daily when she gets up which does help although she still does have some referral towards the leg and foot at times but  this is still overall better.  Treatments have included:    Medications: aleve prn   Injections: none   Therapies:pastPHYSICAL THERAPY,she does some exerciess at home,   Surgery: none      PMH/PSH/MEDICATIONS/FAMILY MEDICAL HISTORY/SOCIAL HISTORY:  See chart. Pertient changes described above.     MEDICATIONS/ALLERGIES:  See chart. No changes.     REVIEW OF SYSTEMS:  All systems reviewed and negative other than mentioned above.    PHYSICAL EXAMINATION:  General: Awake, alert, and oriented, no apparent distress, pleasant, and cooperative  Vitals: Blood pressure 138/70, pulse 68, temperature 97 F (36.1 C), temperature source Tympanic, not currently breastfeeding.There is no height or weight on file to calculate BMI.  Neurologic/Musculoskeletal: Gait mildly antalgic.  Right shoulder examination today is with some restriction and endpoint of abduction and internal and external rotation causing some irritation.  Slight discomfort with impingement and grind.  Some mild weakness perhaps some slight discomfort with rotator cuff strength testing but otherwise she has relatively good external rotation strength as well as abduction strength.  No sensory deficits to light touch testing in the upper extremity on the right side.  She has tightness cervical spine no clear referral Spurling's.  Clear tightness or restriction in the shoulder girdle muscles on the right however.  Repeat lumbar and ankle examination was not done today    IMAGING:  MRI Right Shoulder 02/22/2019:  IMPRESSION:    1.  Supra spinatus full-thickness, partial width tear.  2. Partial thickness tears at the conjoined tendon, infraspinatus, and subscapularis tendons.  3. SLAP tear extending to posterior superior quadrant.    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 We discussed her symptoms as well as past work-up for her joint issues, and more recent imaging of her shoulder and also past treatment etiology of her back symptoms.  2. Regarding her shoulder we feel it is still reasonable to continue with conservative measures and therapy, but getting an opinion from a shoulder surgeon is not unreasonable given that she does have a complete tear and she could well be a surgical candidate.  With time if this retracts further she may not even have the option of doing surgery should she want to do so, and of course if she has progressive degeneration within the joint the endpoint option would be a shoulder replacement surgery.  Clearly she is not necessarily leaning toward surgery but the shoulder is bothering her, we do feel that is worth at least getting an opinion and then she can make a more educated decision as to what she may want to pursue.  Some limitation may will be disposition as she lives independently and will not be able to use her right arm for a set period of time after shoulder surgery.  We therefore provided her with a couple of options regarding some shoulder surgeons to see and she may contact them and schedule an appointment.  3. We also did discuss the shoulder joint injection, but we feel it is worth having her get an evaluation with surgeon first and if there is a delay in surgical management where she chooses not to pursue this time we can do the injection.  She understood and agreed  4. Regarding it hip and back she is doing better and she does have irritation involving the lower lumbosacral junction L5 nerve root and also some involvement of the sacroiliac joint.  Discussed possible intervention but hold off for now  5. Regarding her left ankle  issues she will follow up with podiatry over the next couple of months to see how she does with the treatment per their plan.  If she does not improve and have persistent symptoms we would consider MRI of the ankle.  She understood  6. We also discussed alternative options regarding anti-inflammatory effects and possible medication options in detail.  She may explore some of these treat modalities or investigate further.  This was written down for her.  7. Follow-up in February for reevaluation.  Call or email sooner with any questions or concerns.      Greater than 25 minutes were spent with the patient in clinic today, with more than 50% of the time face-to-face in consultation, education, and coordination of care.          Yeshua Stryker B.HGuido Sander, D.O.   Board certified - Physical Medicine & Rehabilitation      Portions of this document were created with voice-recognition software and may contain occasional errant or misspelled words.

## 2019-03-28 ENCOUNTER — Encounter (INDEPENDENT_AMBULATORY_CARE_PROVIDER_SITE_OTHER): Payer: Self-pay | Admitting: Internal Medicine

## 2019-03-28 NOTE — Telephone Encounter (Addendum)
Opened in error

## 2019-04-01 ENCOUNTER — Encounter (INDEPENDENT_AMBULATORY_CARE_PROVIDER_SITE_OTHER): Payer: Self-pay | Admitting: Internal Medicine

## 2019-04-09 ENCOUNTER — Encounter (INDEPENDENT_AMBULATORY_CARE_PROVIDER_SITE_OTHER): Payer: Self-pay | Admitting: Orthopaedic Surgery

## 2019-04-10 NOTE — Telephone Encounter (Signed)
I would recommend [facility] or [facility].

## 2019-04-11 NOTE — Telephone Encounter (Signed)
ecare messaged patient with recommendations.

## 2019-04-17 ENCOUNTER — Encounter (INDEPENDENT_AMBULATORY_CARE_PROVIDER_SITE_OTHER): Payer: Self-pay | Admitting: Orthopaedic Surgery

## 2019-05-13 ENCOUNTER — Ambulatory Visit (INDEPENDENT_AMBULATORY_CARE_PROVIDER_SITE_OTHER): Payer: Medicare PPO | Admitting: Orthopaedic Surgery

## 2019-05-13 VITALS — BP 123/65 | HR 66 | Temp 98.1°F

## 2019-05-13 DIAGNOSIS — S46011S Strain of muscle(s) and tendon(s) of the rotator cuff of right shoulder, sequela: Secondary | ICD-10-CM

## 2019-05-13 DIAGNOSIS — M48061 Spinal stenosis, lumbar region without neurogenic claudication: Secondary | ICD-10-CM

## 2019-05-13 DIAGNOSIS — M5417 Radiculopathy, lumbosacral region: Secondary | ICD-10-CM

## 2019-05-13 NOTE — Progress Notes (Signed)
05/13/2019    Burr Medico  [id number]    IDENTIFYING DATA:  Robin Hart is a pleasant 79 year old female who was last seen 03/27/2019. Return to discuss right shoulder and back.     INTERIM HISTORY:  Robin Hart returns to discuss right shoulderpain thatradiates to her upper arm. Aware of her RTC tear.   Consulted with [facility] clinic Dr. Jasmine Pang and scheduled for an arthroscopic shoulder surgery in March.   Pain level: 8-9/10 when using her arm, minimal pain at rest.  Continues with exercises for now.     She reports right hip and right low back pain that radiates to her right foot ongoing. Improved with stretching and positional changes. Nothing severe and she denies any progressive neurological issues.     PMH/PSH/MEDICATIONS/FAMILY MEDICAL HISTORY/SOCIAL HISTORY:  See chart. Pertient changes described above.     MEDICATIONS/ALLERGIES:  See chart. No changes.     REVIEW OF SYSTEMS:  All systems reviewed and negative other than mentioned above.    PHYSICAL EXAMINATION:  General: Awake, alert, and oriented, no apparent distress, pleasant, and cooperative  Vitals: Blood pressure 123/65, pulse 66, temperature 98.1 F (36.7 C), temperature source Tympanic, not currently breastfeeding.There is no height or weight on file to calculate BMI.  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 We discussed her current symptoms and also her past imaging again and also her recent consultation with shoulder surgery.  We do feel based on her pain and limitations and complete rotator cuff tear that surgical management is very reasonable.  They would also likely address her labral tear.  We also feel it is great that she continues exercises for now before the  surgery and also advised her to ask for referral to physical therapy as soon as possible after shoulder surgery.  She understood and agreed and will pursue the surgery.  2. Regarding her back she is managing relatively well.  She clearly has degenerative changes that can explain her symptoms including a right L5 radiculopathy but her symptoms have not been progressive and pain is mild to moderate easily addressed with stretches and activity modifications and positional changes.  There is therefore no indication for intervention at this point this is not her primary concern as her shoulder bothers her more.  3. Comfortable the discussion.  She could follow-up with [place] about 4 weeks after her shoulder surgery if she would like so that we can assess how she is coming along regarding her back after shoulder surgery.  She can also call the email with any questions or concerns    I spent a total of 25 minutes for the patient's care on the date of the service.            Robin Hart B.HGuido Sander, D.O.   Board certified - Physical Medicine & Rehabilitation      Portions of this document were created with voice-recognition software and may contain occasional errant or misspelled words.

## 2019-05-13 NOTE — Progress Notes (Signed)
Patient is here for right shoulder pain that radiates to her upper arm.   Consulted with [facility] clinic Dr. Jasmine Pang and scheduled for an arthroscopic shoulder in March.   Pain level: 8-9/10 when using her arm, minimal pain at rest.     She reports right hip and right low back pain that radiates to her right foot ongoing.   Stretching helps    Last seen: 03/27/2019  Treatments have included:    Medications: aleve prn   Injections: none   Therapies: she does some exerciess at home,   Surgery: none  Diagnostic studies (such as EMG and imaging) include:   XR L Spine06/21/18  MRI L Spine 11/15/16  XR Ankle 2 Views Left1/20/2020  MRI Shoulder WO Cont Right 02/22/2019

## 2019-06-07 ENCOUNTER — Encounter (INDEPENDENT_AMBULATORY_CARE_PROVIDER_SITE_OTHER): Payer: Self-pay | Admitting: Internal Medicine

## 2019-06-07 NOTE — Telephone Encounter (Signed)
Please see eCare message and advise    Immunization records updated to include covid vaccines    Patient concerned about when to get second shingles vaccine- shoulder surgery scheduled 3/24- should she have second shingles vaccine prior to surgery?  Or will she need to wait until after, and if so, how long after?    Patient also wondering about BP medication and monitoring.

## 2019-06-08 NOTE — Telephone Encounter (Signed)
Pt.notified

## 2019-06-08 NOTE — Telephone Encounter (Signed)
She can get the shingles vaccine a couple of weeks before the surgery

## 2019-06-25 ENCOUNTER — Encounter (INDEPENDENT_AMBULATORY_CARE_PROVIDER_SITE_OTHER): Payer: Self-pay | Admitting: Internal Medicine

## 2019-06-26 NOTE — Telephone Encounter (Signed)
Please see eCare message and advise

## 2019-07-19 NOTE — Progress Notes (Deleted)
Patient is here for follow up on her low back and hip pain after her shoulder surgery done at the [facility] clinic in March.   She reports:     Pain is located:   Pain level:

## 2019-07-23 ENCOUNTER — Encounter (INDEPENDENT_AMBULATORY_CARE_PROVIDER_SITE_OTHER): Payer: Medicare PPO | Admitting: Orthopaedic Surgery

## 2019-09-12 ENCOUNTER — Other Ambulatory Visit (HOSPITAL_BASED_OUTPATIENT_CLINIC_OR_DEPARTMENT_OTHER): Payer: Self-pay | Admitting: Internal Medicine

## 2019-09-12 DIAGNOSIS — Z1231 Encounter for screening mammogram for malignant neoplasm of breast: Secondary | ICD-10-CM

## 2019-09-17 ENCOUNTER — Telehealth (INDEPENDENT_AMBULATORY_CARE_PROVIDER_SITE_OTHER): Payer: Self-pay | Admitting: Otolaryngology

## 2019-09-17 NOTE — Telephone Encounter (Signed)
Returned patient's call.  Patient complains of thumping sound in Left ear x 3 weeks.  Patient advised to schedule audiogram, opted to see Dr. Augustin Coupe first.  Bennie Hind, CMA

## 2019-09-17 NOTE — Telephone Encounter (Signed)
Patient called and stated that she would like to speak with the medical team. She mentioned that she is beginning to experience tinnitus symptoms but is unsure.

## 2019-09-20 ENCOUNTER — Encounter (INDEPENDENT_AMBULATORY_CARE_PROVIDER_SITE_OTHER): Payer: Medicare PPO | Admitting: Otolaryngology

## 2019-09-24 ENCOUNTER — Ambulatory Visit (INDEPENDENT_AMBULATORY_CARE_PROVIDER_SITE_OTHER): Payer: Medicare PPO | Admitting: Audiologist

## 2019-09-24 DIAGNOSIS — R42 Dizziness and giddiness: Secondary | ICD-10-CM

## 2019-09-24 DIAGNOSIS — L299 Pruritus, unspecified: Secondary | ICD-10-CM

## 2019-09-24 DIAGNOSIS — H93A2 Pulsatile tinnitus, left ear: Secondary | ICD-10-CM

## 2019-09-24 NOTE — Progress Notes (Signed)
Robin Hart is a 77 yrs female who was seen for a hearing evaluation.      Referred by: No referring provider defined for this encounter.      BACKGROUND     "Thumping" AS that onset about 4 weeks ago.  Seems to be mostly on al the time, but is covered up by external sounds     Hearing Loss: Denies    Tinnitus: No other ringing or buzzing sounds.  Does not seem like there is thumping AS    Ear pain or pressure: will get itchy ears. Has seen Dr. Kirke Shaggy for this in the past    Ear Infections: Denies    Ear Surgeries: Denies    Family History: Denies    Noise Exposure: Denies    Dizziness/Imbalance: Slight issues, patient attributes to getting older    Hearing Aid / Cochlear Implant use: Denies       [place]    Otoscopic Inspection:     Right Ear: Clear EAC, visualized TM    Left Ear: Clear EAC, visualized TM    Tympanometry: See Scanned results in EPIC media tab    Right Ear: Normal peak pressure and compliance    Left Ear: Normal peak pressure and compliance    Audiometry: See Scanned results in EPIC media tab    Right Ear: Hearing is within normal limits 250-8000Hz  and hearing stable compared to prior audiogram     Left Ear: Hearing is within normal limits 250-8000Hz  and hearing stable compared to prior audiogram     Speech Discrimination (Word Recognition): See Scanned results in EPIC media tab    Right Ear:  100% words correct at 55 dBHL.    Left Ear:  100% words correct at  55 dBHL    Otoacoustic Emissions: See Scanned results in EPIC media tab    Right ear: Consistent with Audiogram    Left ear: Consistent with Audiogram    PLAN      ICD-10-CM    1. Pulsatile tinnitus of left ear  H93.A2    2. Ear itching  L29.9    3. Disequilibrium  R42        Today's testing revealed hearing within normal limits in both ears.  Thresholds stable compared to prior audiometric testing.    Patient to follow up with Dr. Kirke Shaggy as scheduled    Kizzie Furnish, AUD

## 2019-10-01 ENCOUNTER — Encounter (INDEPENDENT_AMBULATORY_CARE_PROVIDER_SITE_OTHER): Payer: Self-pay | Admitting: Otolaryngology

## 2019-10-01 ENCOUNTER — Other Ambulatory Visit: Payer: Self-pay

## 2019-10-01 ENCOUNTER — Ambulatory Visit (INDEPENDENT_AMBULATORY_CARE_PROVIDER_SITE_OTHER): Payer: Medicare PPO | Admitting: Otolaryngology

## 2019-10-01 DIAGNOSIS — H93A2 Pulsatile tinnitus, left ear: Secondary | ICD-10-CM

## 2019-10-01 DIAGNOSIS — L299 Pruritus, unspecified: Secondary | ICD-10-CM

## 2019-10-01 DIAGNOSIS — R519 Headache, unspecified: Secondary | ICD-10-CM

## 2019-10-01 MED ORDER — NYSTATIN-TRIAMCINOLONE 100000-0.1 UNIT/GM-% EX CREA
TOPICAL_CREAM | Freq: Two times a day (BID) | CUTANEOUS | 2 refills | Status: DC
Start: 2019-10-01 — End: 2021-04-28

## 2019-10-01 NOTE — Patient Instructions (Signed)
Itchy Ears    Itchy ears are a very common problem. For some people the problem is so bad that they stick various objects into the ears, causing trauma to the ear canal. The most common causes of itching are a nervous habit, fungal infection or the beginning of an infection. Other causes can be skin diseases such as psoriasis or dermatitis. Some people with allergies complain of Itchy ears.    The ear canal may be normal on examination or there may be scaling of the skin. People aggravate the problem by using things such as bobby-pins, coat hangers and tooth picks to scratch the ear. This can produce abrasions of the ear canal.  Any break in the skin can allow bacteria to enter through this protective barrier. The ear will then become infected requiring it to be treated the same as swimmer’s ear.    Itching by itself without evidence of trauma or infection can be treated with a mild steroid ear drop.  A few drops placed in the ear will help to decrease the amount of itching. Another helpful treatment is the use of 70% alcohol (rubbing alcohol) as an ear drop. CAUTION: itchy ears may be the first signal that an infection is developing. If the ear is infected the alcohol will burn.      Thank you for choosing us at [facility] to be a part of your health care team!  We care about your experience here and want your journey to be as frictionless as possible.  Your feedback is very important to our practice. Should you receive a [place] patient satisfaction survey, please let us know how we are doing.  [place] accepts 9-10 as OK and satisfied, 1-8 as no credit.  It is our goal to serve you with the highest quality care.                                          Thank you, Sarah & Dr. Josyah Achor  :)

## 2019-10-01 NOTE — Progress Notes (Signed)
Office Visit      PATIENT:  Robin Hart  DOB:  [birth year]  MRN:  [id number]      CHIEF COMPLAINT/HPI:  Patient is a 75 year oldfemalewho presented with left pulsatile tinnitus for 1 month.  Symptoms are moderate, continuous, and unchanged.  There is no known etiology.  Associated symptoms include new headaches.    Patient also presented with bilateral itchy ears for many years.  Symptoms are moderate, continuous, and unchanged.  Symptoms improve temporarily with fluocinolone otic gtt.  She saw Dr. Verdene Lennert for   migraine associated dizziness.  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  She had vestibular therapy.  She continues to use Q-tips.She deniesotalgia, otorrhea, and hearing loss.      . Review of the results of each unique test   Audiogram 09/24/2019: WNL-stable, SDS 100% AU, tymp A AU, OAE consistent with audiogram      Prior testing:   Audiogram 07/10/17:  WNL, tymp A AU, SDS 100% AU  Audiogram 02/14/17: WNL, tymp A AU, SDS 100% AU  VNG: WNL  ECOG: AD 0.23, AS 0.10 (WNL = <.45)  ABR: WNL  MRI brain 02/27/17:  Foci of apparent microhemorrhage in the pons and thalami bilaterally with   pattern most consistent with hypertensive disease.  MRA head/neck 02/27/17:  WNL      ALLERGIES:  is allergic to adhesives and other (see comments).    MEDICATIONS:  has a current medication list which includes the following prescription(s): amlodipine, chlorthalidone, estradiol, fluocinolone, latanoprost, losartan, nystatin-triamcinolone, prednisolone acetate, and trospium chloride er.       PHYSICAL EXAMINATION:  Vitals:    10/01/19 1145   BP: (P) 128/70   BP Cuff Size: (P) Regular   BP Site: (P) Left Arm   BP Position: (P) Sitting   Pulse: (P) 59   SpO2: (P) 98%        General: Well developed, appearing stated age and in no acute distress. Participated in the patient interview appropriately.  Eyes: Clear conjunctive, normal lids. Pupils equal, round and reactive to light.  Extraocular movements intact.  Ears:visualized by microscopy.  Right:Pinna unremarkable. External ear canal without masses or skin abnormality.   No cerumen impaction. Tympanic membrane intact and without retraction or perforation. Middle ear without fluid.No hearing loss.   Left: Pinna unremarkable. External ear canal without masses or skin abnormality.   No cerumen impaction. Tympanic membrane intact and without retraction or perforation. Middle ear without fluid.No hearing loss.       IMPRESSION/PLAN: Chronic otitis externa, AS tinnitus with new headache  Audiogram reviewed-WNL  Order MRA/MRI brain - [place], for AS tinnitus with new headache  Prescribe  Mycolog cream for chronic otitis externa         Kirke Shaggy, MD      Cc:  Rachel Moulds Mathews Robinsons, MD  Renelda Loma, MD  [address] [place] [address]

## 2019-10-15 ENCOUNTER — Encounter (INDEPENDENT_AMBULATORY_CARE_PROVIDER_SITE_OTHER): Payer: Medicare PPO | Admitting: Orthopaedic Surgery

## 2019-10-21 ENCOUNTER — Ambulatory Visit (HOSPITAL_BASED_OUTPATIENT_CLINIC_OR_DEPARTMENT_OTHER)
Admit: 2019-10-21 | Discharge: 2019-10-21 | Disposition: A | Discharge status: Home / Self Care | Payer: Medicare PPO | Source: Home / Self Care

## 2019-10-21 ENCOUNTER — Ambulatory Visit
Admission: RE | Admit: 2019-10-21 | Discharge: 2019-10-21 | Disposition: A | Discharge status: Home / Self Care | Payer: Medicare PPO | Attending: Unknown Physician Specialty | Admitting: Unknown Physician Specialty

## 2019-10-21 DIAGNOSIS — H93A2 Pulsatile tinnitus, left ear: Secondary | ICD-10-CM

## 2019-10-21 DIAGNOSIS — R519 Headache, unspecified: Secondary | ICD-10-CM

## 2019-10-21 MED ORDER — GADOTERIDOL 279.3 MG/ML IV SOLN
12.0000 mL | Freq: Once | INTRAVENOUS | Status: AC | PRN
Start: 2019-10-21 — End: 2019-10-21
  Administered 2019-10-21: 6 mmol via INTRAVENOUS

## 2019-11-04 ENCOUNTER — Telehealth (INDEPENDENT_AMBULATORY_CARE_PROVIDER_SITE_OTHER): Payer: Medicare PPO | Admitting: Otolaryngology

## 2019-11-04 ENCOUNTER — Encounter (INDEPENDENT_AMBULATORY_CARE_PROVIDER_SITE_OTHER): Payer: Self-pay | Admitting: Otolaryngology

## 2019-11-04 DIAGNOSIS — H93A2 Pulsatile tinnitus, left ear: Secondary | ICD-10-CM

## 2019-11-04 NOTE — Patient Instructions (Signed)
Great news! Your MRI and MRA of your brain look good. There are no brain tumors on your MRI brain. 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 It is an important artery for blood supply to your brain.      Please follow-up if your symptoms change or are becoming more bothersome. We may consider tinnitus retraining therapy to help retrain your brain not to focus on these symptoms.      Thank you for choosing [place] at [facility] to be a part of your health care team!  We care about your experience here and want your journey to be as frictionless as possible.  Your feedback is very important to our practice. Should you receive a [place] patient satisfaction survey, please let us know how we are doing.  [place] accepts 9-10 as OK and satisfied, 1-8 as no credit.  It is our goal to serve you with the highest quality care.                                          Thank you, Sarah & Dr. Augustin Coupe  :)

## 2019-11-04 NOTE — Progress Notes (Signed)
[facility] Telemedicine Encounter    This encounter was conducted from the [facility] clinic via [place], live, face-to-face video conference with the patient at home.  In light of current COVID-19 pandemic, this patient encounter is appropriate and reasonable under the circumstances given the patient's particular presentation at this time.  I reviewed the risks and benefits of telemedicine as pertinent to this visit, and the patient agreed to proceed.     As with any health care service, there are risks associated with the use of telemedicine, including equipment failure, poor image resolution, and information security issues.     Do you understand the risks and benefits of telemedicine as I have explained them to you? YES     Have your questions regarding telemedicine been answered? YES     Do you consent to the use of telemedicine in your medical care today? YES           PATIENT:  Robin Hart  DOB:  [birth year]  MRN:  [id number]      CHIEF COMPLAINT/HPI:  Patient is a 64 year oldfemalewho presented with left pulsatile tinnitus for 2 months.  Symptoms are moderate, continuous, and unchanged. She is here for MRI and MRA brain review. There is no known etiology.  She saw Dr. Verdene Lennert for migraine associated dizziness.  Her [facility] neurologist diagnosed migraine associated dizziness.  She had vestibular therapy. She has itchy ears and was prescribed Mycolog cream. She deniesotalgia, otorrhea, and hearing loss.  LV 09/2019 ear itching, tinnitus.    . 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  MRA head/neck 02/27/17:  WNL      ALLERGIES:  is allergic to adhesives and other (see comments).    MEDICATIONS:  has a current medication list which includes the following prescription(s): amlodipine, chlorthalidone, estradiol, fluocinolone, latanoprost, losartan, nystatin-triamcinolone, prednisolone acetate, and trospium chloride er.       PHYSICAL EXAMINATION:    No vital signs were measured for this telemedicine visit.  Exam findings are limited and based solely on video visualization.    General: Well developed, appearing stated age and in no acute distress.  Participated in the patient interview appropriately.  Eyes: Clear conjunctive, normal lids. Extraocular movements intact.  Ears:     Right: Pinna unremarkable.  No hearing loss.     Left: Pinna unremarkable.  No hearing loss.    Nose:  Clear anteriorly.    OP: Normal lips  Neck: No obvious masses.  Skin: No rashes   Neuro:  CN II-XII grossly intact (VII Movements of facial expression bilaterally intact and symmetrical)  Lungs: normal effort  Extremities: No edema  IMPRESSION/PLAN:  AS pulsatile tinnitus   MRA/MRI brain reviewed  Observation of symptoms  Consider tinnitus retraining therapy if tinnitus is bothersome  Follow-up with PCP regarding MRI findings -remote lacunar infarcts      Kirke Shaggy, MD      Cc:  Rachel Moulds Mathews Robinsons, MD  Renelda [place] [address]    I have spent 30 minutes on this visit today including chart review prior to the visit, face to face time with the patient, and documentation and coordination of care after  the visit.

## 2019-11-05 ENCOUNTER — Encounter (HOSPITAL_BASED_OUTPATIENT_CLINIC_OR_DEPARTMENT_OTHER): Payer: Self-pay

## 2019-11-05 ENCOUNTER — Ambulatory Visit
Admit: 2019-11-05 | Discharge: 2019-11-05 | Disposition: A | Discharge status: Home / Self Care | Payer: Medicare PPO | Attending: Diagnostic Radiology | Admitting: Diagnostic Radiology

## 2019-11-05 DIAGNOSIS — Z1231 Encounter for screening mammogram for malignant neoplasm of breast: Secondary | ICD-10-CM | POA: Insufficient documentation

## 2019-11-06 ENCOUNTER — Other Ambulatory Visit (HOSPITAL_BASED_OUTPATIENT_CLINIC_OR_DEPARTMENT_OTHER): Payer: Self-pay | Admitting: Internal Medicine

## 2019-11-06 ENCOUNTER — Other Ambulatory Visit (HOSPITAL_BASED_OUTPATIENT_CLINIC_OR_DEPARTMENT_OTHER): Payer: Self-pay | Admitting: Family Medicine

## 2019-11-06 ENCOUNTER — Other Ambulatory Visit (INDEPENDENT_AMBULATORY_CARE_PROVIDER_SITE_OTHER): Payer: Self-pay

## 2019-11-06 DIAGNOSIS — N6310 Unspecified lump in the right breast, unspecified quadrant: Secondary | ICD-10-CM

## 2019-11-06 DIAGNOSIS — R928 Other abnormal and inconclusive findings on diagnostic imaging of breast: Secondary | ICD-10-CM

## 2019-11-12 DIAGNOSIS — H93A2 Pulsatile tinnitus, left ear: Secondary | ICD-10-CM | POA: Insufficient documentation

## 2019-11-12 DIAGNOSIS — I6789 Other cerebrovascular disease: Secondary | ICD-10-CM | POA: Insufficient documentation

## 2019-11-13 ENCOUNTER — Other Ambulatory Visit: Payer: Self-pay

## 2019-11-13 ENCOUNTER — Encounter (HOSPITAL_BASED_OUTPATIENT_CLINIC_OR_DEPARTMENT_OTHER): Payer: Self-pay

## 2019-11-13 ENCOUNTER — Ambulatory Visit
Admission: RE | Admit: 2019-11-13 | Discharge: 2019-11-13 | Disposition: A | Discharge status: Home / Self Care | Payer: Medicare PPO | Attending: Diagnostic Radiology | Admitting: Diagnostic Radiology

## 2019-11-13 DIAGNOSIS — R928 Other abnormal and inconclusive findings on diagnostic imaging of breast: Secondary | ICD-10-CM | POA: Insufficient documentation

## 2019-11-19 ENCOUNTER — Ambulatory Visit (INDEPENDENT_AMBULATORY_CARE_PROVIDER_SITE_OTHER): Payer: Medicare PPO | Admitting: Internal Medicine

## 2019-11-19 ENCOUNTER — Ambulatory Visit: Payer: Medicare PPO | Attending: Internal Medicine

## 2019-11-19 VITALS — BP 134/61 | HR 63 | Wt 118.0 lb

## 2019-11-19 DIAGNOSIS — M1990 Unspecified osteoarthritis, unspecified site: Secondary | ICD-10-CM | POA: Insufficient documentation

## 2019-11-19 LAB — RHEUMATOID FACTOR: Rheumatoid Factor: <10 [IU]/mL (ref <15)

## 2019-11-19 LAB — SED RATE: Erythrocyte Sedimentation Rate: 17 mm/h (ref 0–20)

## 2019-11-19 LAB — C_REACTIVE PROTEIN: C_Reactive Protein: 2.9 mg/L (ref 0.0–10.0)

## 2019-11-19 MED ORDER — METHYLPREDNISOLONE 4 MG OR TABS
ORAL_TABLET | ORAL | 0 refills | Status: DC
Start: 2019-11-19 — End: 2019-12-17

## 2019-11-19 NOTE — Progress Notes (Signed)
[place]  [address]  TEL: [phone]  l  FAX: [phone]    Follow Up Clinic Note  11/19/2019    IDENTIFYING DATA:  MRN: [id number]  DOB: [birth year]    CHIEF COMPLAINT:      Robin Hart is a very pleasant 79 year old female who presents for follow up of positive CCP test and concern for potential underlying connective tissue disease      RHEUMATOLOGIC HISTORY OF PRESENT ILLNESS:  The patient was initially seen in clinic on January 26, 2019.  She carries known diagnosis of low back pain with right-sided L4 and L5 radiculopathy with corresponding facet arthritis and impingement noted on previous MRI on 11/15/2016.  She is seen Dr. Harlin Heys for this in the past and at the current time she is managing this with physical therapy and a very seldom dose of naproxen.  She feels that her back pain in general has been overall stable and not worsening to the point that she would consider epidural steroid injection or surgery.    The patient developed bilateral plantar fasciitis left greater than right in October 2019.  She saw physical therapy and podiatry, received 2 steroid injections and reports that the symptoms have been getting better.  She also reports having some longstanding discomfort in the bilateral first MCP joints without any swelling, redness, warmth and this is currently managed using occasional topical diclofenac.  She also has had some right shoulder discomfort especially with raising her arm above the head that is managed by physical therapy.  She notes some mild morning stiffness in her back and plantar fascia region that is generally 20 minutes or less.  She denies any history of dactylitis, psoriasis, rashes, inflammatory bowel disease, inflammatory eye disease, or recurrent tendinitis.  She does report history of irritable bowel syndrome.  Overall she denies any joint swelling, redness, or warmth or morning stiffness lasting longer than 20  minutes.  She denies any family history of rheumatoid arthritis or other inflammatory arthritis conditions.    The patient's podiatrist did do a preliminary autoimmune work-up due to her bilateral plantar fasciitis that showed a negative ANA, negative rheumatoid factor, mildly elevated CCP autoantibody of 35, and an ESR of 12 and CRP of 3.      INTERVAL HISTORY:  The patient was initially seen in clinic January 26, 2019.  At that time she had history of positive CCP test but no evidence as of yet of clinical rheumatoid arthritis.  We discussed that her CCP autoantibody test is either a false positive or may potentially precede clinical syndrome of rheumatoid arthritis that may occur at some point in the future.  We discussed watchful waiting at that time.    The patient presents today to discuss some worsening symptoms since her last visit.  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  For her eyes she is using fish oil and eye hygiene products without as much improvement.  She has not been told that she has iritis or uveitis and does see her eye doctor regularly.  She is not had any frequency of cavities recently from her dry mouth and has had regular dental checkups recently.    The  patient has noticed some worsened issues with arthritis over the course of the last few months.  She has had history of left-sided plantar fasciitis in the past and now is having right foot plantar fasciitis as well.  She is also having some issues with left-sided Achilles tendinitis and has history of bilateral rotator cuff tendinitis.  Additionally she is having pain in the bilateral lateral hips in the area of the greater trochanteric bursa/gluteus medius tendon.  She has been doing some physical therapy for a variety of these issues over the  course of the last several months.    The patient denies any history of rash, Raynaud's phenomenon, frequent infections, or inflammatory pattern sacroiliitis.  She does endorse problems with her vision, constipation, heartburn symptoms, fatigue, muscle weakness, dry eyes, and dry mouth.    REVIEW OF SYSTEMS:  Complete ROS is negative, except for those mentioned above in the Interval History    PROBLEM LIST:  Patient Active Problem List    Diagnosis Date Noted   . Traumatic complete tear of right rotator cuff [S46.011A] 03/27/2019   . Other sprain of right shoulder joint, sequela [S43.491S] 02/04/2019   . Bilateral ankle joint pain [M25.571, M25.572] 02/04/2019   . Migraine with vertigo [G43.109] 01/18/2018   . Migraine without aura and without status migrainosus, not intractable [G43.009] 01/18/2018   . Chronic daily headache [R51.9] 01/18/2018   . Chronic pain of both shoulders [M25.511, G89.29, M25.512] 06/19/2017   . Somatic dysfunction of upper extremity [M99.07] 06/19/2017   . 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 Spondylosis of lumbosacral region without myelopathy or radiculopathy [M47.817] 11/10/2016   . Lumbosacral radiculopathy at L5 [M54.17] 11/10/2016   . Chronic bilateral low back pain with right-sided sciatica [M54.41, G89.29] 11/10/2016   . Somatic dysfunction of right lower extremity [M99.06] 11/10/2016   . Nonallopathic lesion of lumbosacral region [M99.9] 11/10/2016   . Urge incontinence [N39.41] 10/03/2016   . 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     . Functional dyspepsia [K30] 07/19/2016   . Pelvic floor dysfunction [M62.89] 05/04/2015   . 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 Nausea [R11.0] 05/04/2015   . OAB (overactive bladder) [N32.81] 03/05/2015      Doing 2 studies at [facility]    Last Assessment & Plan:   Evaluation by urology Dr. Vikki Ports and 2013 reviewed  Prior ineffective response to PTNS, Myrbertriq or oxytrol patch - in interval pt has retired and eliminated caffeine from diet -   Has resumed Myrbetiq in past 2 weeks fr new trial - will watch BP and follow up with uro on effectiveness of med tx     . 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 No changes, encouraged HEP, continue core strength.      . Osteoporosis [M81.0] 11/21/2014   . Irritable bowel syndrome [K58.9] 08/25/2014   . Hypertension [I10] 07/07/2014     Overview:   Borderline     Last Assessment & Plan:   Formatting of this note might be different from the original.  Improved on low dose ARB -but higher dose exacerbates BPPV symptoms -  Added HCTZ 05/2011 and saw cards 11/2011 -  Changed to low dose beta-blocker 10/2013 because of headache, but declined to start same as home BP log with SBP range 110-140  The current treatment appears effective;  continue present plan and medications.     BP Readings from Last 3 Encounters:   11/14/13 140/72   10/25/13 160/72   09/20/13 122/70      . Migraine with aura and without status migrainosus, not intractable [G43.109] 12/03/2013     Last Assessment & Plan:   Patient has been responding very well to osteopathic manipulation. Encourage patient to continue the home exercises and continue to work posture changes. We discussed ergonomic changes during the workday. Patient will also make these changes when driving. Patient will come back and see me again in 6 weeks for further evaluation and treatment.     . Hyperlipidemia [E78.5] 01/21/2010     Overview:   Mild       Last Assessment & Plan:   Treat BP if elevated         SOCIAL/FAMILY HISTORY:  Reviewed with patient. No  changes from what is currently noted in EPIC    ALLERGIES:  Review of patient's allergies indicates:  Allergies   Allergen Reactions   . Adhesives Skin: Itching   . Other (See Comments)      Other reaction(s): Adverse Drug Reaction       MEDICATIONS:  Current Outpatient Medications   Medication Sig Dispense Refill   . amLODIPine 2.5 MG tablet Take 1 tablet (2.5 mg) by mouth daily. 90 tablet 3   . chlorthalidone 25 MG tablet Take 0.5 tablets (12.5 mg) by mouth daily. For high blood pressure. 45 tablet 3   . estradiol 0.1 MG/GM vaginal cream Place 2 g into the vagina at bedtime 2 times a week (Patient not taking: Reported on 10/01/2019) 42.5 g 5   . fluocinolone 0.01 % otic oil Place 4 drops to each ear 2 times a day as needed (itchy ears). 1 bottle 2   . latanoprost 0.005 % ophthalmic solution latanoprost 0.005 % eye drops   INSTILL 1 DROP INTO AFFECTED EYE(S) BY OPHTHALMIC ROUTE ONCE DAILY INTHE EVENING     . losartan 100 MG tablet Take 1 tablet (100 mg) by mouth daily. 90 tablet 3   . nystatin-triamcinolone 100000-0.1 UNIT/GM-% cream Apply topically 2 times a day. Apply to itchy ears, BID as needed. 60 g 2   . prednisoLONE acetate 1 % ophthalmic suspension 1 drop in the right eye four times a day for 1 week     . Trospium Chloride ER 60 MG capsule extended-release 24 hour        No current facility-administered medications for this visit.        PHYSICAL EXAMINATION:  Vital signs:  BP 134/61   Pulse 63   Wt 53.5 kg (118 lb)   BMI 22.30 kg/m   General:  Awake, alert, and oriented, no apparent distress, pleasant, and cooperative  Psychologic:  Mood is euthymic, affect is congruent  EYES: Anicteric sclera, no conjunctival injection  ENT:  Normocephalic, atraumatic, moist membranes, no oral ulcers   Pulmonary:  Non-labored breathing  Skin:  Normal temperature and texture, No visible rashes, ulcers, or nodules  Neurologic:  Light touch sensation is grossly intact. Face symmetric   Musculoskeletal:     Gait:  Normal   Joints: No clear  evidence synovitis or joint swelling in the hands, wrist, elbows, shoulders, knees, or ankles.  There is tenderness over the bilateral lateral hips.  There is positive empty can and Michel Bickers test on the left shoulder.  There is some tenderness over the left Achilles tendon region.   Nails: No pitting, clubbing, or cyanosis noted in the fingernails bilaterally    Muscle strength: Strength is grossly 5 out of 5 throughout the bilateral upper and lower extremities    LABS:  Labs are pending at the end of today's evaluation    Results for orders placed or performed in visit on 01/21/19   Comprehensive Metabolic Panel   Result Value Ref Range    Sodium 143 135 - 145 meq/L    Potassium 3.9 3.6 - 5.2 meq/L    Chloride 102 98 - 108 meq/L    Carbon Dioxide, Total 34 (H) 22 - 32 meq/L    Anion Gap 7 4 - 12    Glucose 90 62 - 125 mg/dL    Urea Nitrogen 18 8 - 21 mg/dL    Creatinine 0.61 0.38 - 1.02 mg/dL    Protein (Total) 6.4 6.0 - 8.2 g/dL    Albumin 4.4 3.5 - 5.2 g/dL    Bilirubin (Total) 0.4 0.2 - 1.3 mg/dL    Calcium 9.8 8.9 - 10.2 mg/dL    AST (GOT) 27 9 - 38 U/L    Alkaline Phosphatase (Total) 79 49 - 199 U/L    ALT (GPT) 16 7 - 33 U/L    eGFR, Calculated >60 >59 mL/min/[1.73_m2]    GFR, Information       Calculated GFR by CKD-EPI equation. Inaccurate with changing renal function. See http://depts.[email or web address].html.       IMAGING/DIAGNOSTIC RESULTS:   MRI of the lumbar spine from 11/15/2016  IMPRESSION:  1.  Grade 1 spondylolisthesis of L4 on L5 with moderate disc degeneration and severe facet arthropathy causing moderate right lateral recess and foraminal stenosis impinging on the descending right L5 nerve root and exiting right L4 nerve root  2.  Severe bilateral L5-S1 and L3-L4 facet arthropathy  3.  Grade 1 spondylolisthesis of L4 on L5 without spondylosis identified, likely related to ligamentous laxity from facet arthropathy  4.  Probable transverse  fracture of S3.  Sacrum is incompletely assessed in the exam.  Recommend clinical correlation regarding the need for formal imaging on the sacrum  5.  1 cm multiloculated ganglion is noted in the interspinous space between L4 and L5 spinous processes likely related to chronic repetitive trauma        DISEASE ACTIVITY:  Patient Function (HAQ): 0.7  Patient Pain Assessment: 6.5  Patient Global Assessment: 5.5  Combined Rapid 3 Score:  12.7      ASSESSMENT AND PLAN:  Robin Hart is a very pleasant 79 year old female who presents for follow up of  Positive CCP test and concern for potential underlying rheumatoid arthritis    Positive CCP test, Arthralgias, concern for inflammatory tendinitis  The patient is a very pleasant woman who has mildly positive CCP test on recent outside laboratory work.  She has negative rheumatoid factor and normal inflammatory markers and does not have inflammatory pattern peripheral arthritis consistent with rheumatoid arthritis at this time.  She does have fairly longstanding low back pain with multiple abnormalities noted on a lumbar MRI from August 2018 and symptoms generally correspond to this.  She also has been having some bilateral plantar fasciitis symptoms that  have improved with appropriate therapy.     At her initial appointment, the patient did not have symptoms that were consistent with rheumatoid arthritis and was felt that she likely had mechanical issues with her back and bilateral CMC joints.  Since her last visit however she is started to have more issues with worsening musculoskeletal pain that seems primarily localized and tenderness region such as the plantar fascia on the bilateral trochanteric region.  She also has history of tendinitis in the Achilles tendon and bilateral rotator cuff.  We discussed the possibility that this is mechanical versus inflammatory nature and discussed doing a trial of steroids as well as repeating labs.  - Start methylprednisolone  3 times daily x3 days, then 2 tabs daily x3 days, then 1 tab daily x3 days, then stop  - Check labs including ANA, CCP, rheumatoid factor, serum protein electrophoresis, HLA-B27, ESR, CRP    I spent a total of 41 minutes for the patient's care on the date of the service.         All the patient's questions were answered. The patient will follow up in 4-6 weeks    CC: PCP    Dictated using speech recognition technology and electronically signed by Whitney Post. Arlington Calix, MD   Note: This document may contain errors inherent to this technology. If you find any errors that might affect patient care, please contact our office as soon as possible.   [address], [place], [place]  Ph. [phone] Fx. [phone]

## 2019-11-19 NOTE — Patient Instructions (Addendum)
It was a pleasure seeing you in clinic today.     Continue your current medications   Take 3 tabs daily x 3 days, then 2 tabs daily x 3 days, then 1 tab (4mg ) daily x 3 days, then stop   Get labs after your appointment today in [address].   Follow up in 3-4 weeks    If you have any questions or concerns before your next appointment, please do not hesitate to contact me. Our clinic phone number is [phone] and I am also available via [employer].

## 2019-11-20 LAB — PROTEIN ELECTR. REFLX PNL
Albumin: 4.5 g/dL (ref 3.5–4.9)
Alpha 1: 0.2 g/dL (ref 0.1–0.3)
Alpha 2: 0.6 g/dL (ref 0.3–0.8)
Beta: 0.6 g/dL (ref 0.6–1.0)
Electrophoresis Interp:: NORMAL
Gamma: 0.7 g/dL (ref 0.4–1.4)
Protein (Total): 6.6 g/dL (ref 6.0–8.2)

## 2019-11-20 LAB — ANTI C. CITRULLINATED PEPTIDE2
Anti CCP2 Comment: NEGATIVE
Anti CCP2 Level: <0.5 U/mL (ref 0.0–2.9)

## 2019-11-20 LAB — ANA REFLEX COMPREHENSIVE PANEL
Ana Interpretation Comment 1: NEGATIVE
Ana Screen By Multiplex: NEGATIVE
Antibodies to Nuclear Ags by IF (ANA): NEGATIVE
Hep2 Cytoplasmic Pattern: NEGATIVE

## 2019-11-27 ENCOUNTER — Telehealth (INDEPENDENT_AMBULATORY_CARE_PROVIDER_SITE_OTHER): Payer: Self-pay | Admitting: Internal Medicine

## 2019-11-27 LAB — HLA B*27 TYPING (SENDOUT): HLA B*27 Result (Sendout): NEGATIVE

## 2019-11-27 NOTE — Telephone Encounter (Signed)
The patient is feeling dizzy. It started a couple days ago.    The Patient was seen on 11/19/19 with Dr.Peck. He started  her on a new medication, Methylprednisolone.  A couple of days ago she started feeling dizzy and it is increasing. She took a dose today and she still has 3 more days left.  The Patient wants to know if she could stop taking the Methylprednisolone and not finish the last 3 doses? . Or should she finish the prescription?

## 2019-11-27 NOTE — Telephone Encounter (Signed)
Called patient. She has been on the methylpred for just a few days and has noticed a dramatic reduction in her joint symptoms. However, she has developed dizziness. I have advised her she can stop the methylpred. If the dizziness continues to worsen, she should see treatment as it may not be due to the medication.     Lowry Bowl, DNP, ARNP

## 2019-11-28 ENCOUNTER — Encounter (INDEPENDENT_AMBULATORY_CARE_PROVIDER_SITE_OTHER): Payer: Self-pay | Admitting: Internal Medicine

## 2019-11-28 DIAGNOSIS — M1990 Unspecified osteoarthritis, unspecified site: Secondary | ICD-10-CM

## 2019-11-29 ENCOUNTER — Other Ambulatory Visit (INDEPENDENT_AMBULATORY_CARE_PROVIDER_SITE_OTHER): Payer: Self-pay | Admitting: Internal Medicine

## 2019-11-29 DIAGNOSIS — M1990 Unspecified osteoarthritis, unspecified site: Secondary | ICD-10-CM

## 2019-11-29 MED ORDER — HYDROXYCHLOROQUINE SULFATE 200 MG OR TABS
ORAL_TABLET | ORAL | 1 refills | Status: DC
Start: 2019-11-29 — End: 2019-12-17

## 2019-11-29 MED ORDER — HYDROXYCHLOROQUINE SULFATE 200 MG OR TABS
200.0000 mg | ORAL_TABLET | Freq: Every day | ORAL | 6 refills | Status: DC
Start: 2019-11-29 — End: 2019-11-29

## 2019-11-29 NOTE — Addendum Note (Signed)
Addended by: Roselle Locus on: 11/29/2019 03:27 PM     Modules accepted: Orders

## 2019-11-29 NOTE — Telephone Encounter (Signed)
Routing to provider for review and approval.    Hydroxychloroquine 200 MG    Last Labs: 11/19/19  Last Office visit: 11/19/2019  Next Appointment: 12/17/2019

## 2019-11-29 NOTE — Addendum Note (Signed)
Addended byArnette Felts on: 11/29/2019 09:46 AM     Modules accepted: Orders

## 2019-11-30 ENCOUNTER — Encounter (INDEPENDENT_AMBULATORY_CARE_PROVIDER_SITE_OTHER): Payer: Self-pay

## 2019-12-03 ENCOUNTER — Encounter (INDEPENDENT_AMBULATORY_CARE_PROVIDER_SITE_OTHER): Payer: Self-pay | Admitting: Internal Medicine

## 2019-12-16 ENCOUNTER — Other Ambulatory Visit: Payer: Self-pay

## 2019-12-17 ENCOUNTER — Ambulatory Visit (INDEPENDENT_AMBULATORY_CARE_PROVIDER_SITE_OTHER): Payer: Medicare PPO | Admitting: Internal Medicine

## 2019-12-17 VITALS — BP 137/62 | HR 62 | Wt 118.0 lb

## 2019-12-17 DIAGNOSIS — G8929 Other chronic pain: Secondary | ICD-10-CM

## 2019-12-17 DIAGNOSIS — M7062 Trochanteric bursitis, left hip: Secondary | ICD-10-CM

## 2019-12-17 DIAGNOSIS — M545 Low back pain, unspecified: Secondary | ICD-10-CM

## 2019-12-17 DIAGNOSIS — M7061 Trochanteric bursitis, right hip: Secondary | ICD-10-CM

## 2019-12-17 NOTE — Progress Notes (Signed)
[place]  [address]  TEL: [phone]  l  FAX: [phone]    Follow Up Clinic Note  12/17/2019    IDENTIFYING DATA:  MRN: [id number]  DOB: 10/05/1940    CHIEF COMPLAINT:      Robin Hart is a very pleasant 79 year old female who presents for follow up of positive CCP test and concern for potential underlying connective tissue disease      RHEUMATOLOGIC HISTORY OF PRESENT ILLNESS:  The patient was initially seen in clinic on January 26, 2019.  She carries known diagnosis of low back pain with right-sided L4 and L5 radiculopathy with corresponding facet arthritis and impingement noted on previous MRI on 11/15/2016.  She is seen Dr. Harlin Heys for this in the past and at the current time she is managing this with physical therapy and a very seldom dose of naproxen.  She feels that her back pain in general has been overall stable and not worsening to the point that she would consider epidural steroid injection or surgery.    The patient developed bilateral plantar fasciitis left greater than right in October 2019.  She saw physical therapy and podiatry, received 2 steroid injections and reports that the symptoms have been getting better.  She also reports having some longstanding discomfort in the bilateral first MCP joints without any swelling, redness, warmth and this is currently managed using occasional topical diclofenac.  She also has had some right shoulder discomfort especially with raising her arm above the head that is managed by physical therapy.  She notes some mild morning stiffness in her back and plantar fascia region that is generally 20 minutes or less.  She denies any history of dactylitis, psoriasis, rashes, inflammatory bowel disease, inflammatory eye disease, or recurrent tendinitis.  She does report history of irritable bowel syndrome.  Overall she denies any joint swelling, redness, or warmth or morning stiffness lasting longer than 20  minutes.  She denies any family history of rheumatoid arthritis or other inflammatory arthritis conditions.    The patient's podiatrist did do a preliminary autoimmune work-up due to her bilateral plantar fasciitis that showed a negative ANA, negative rheumatoid factor, mildly elevated CCP autoantibody of 35, and an ESR of 12 and CRP of 3.      INTERVAL HISTORY:  The patient was last seen in clinic on November 19, 2019.  At that time she had fairly widespread symptoms of polyarthralgia that were primarily tenderness in nature such as the bilateral greater trochanters, plantar fascia, the Achilles tendon and rotator cuff.  We discussed doing corticosteroid taper and seeing if this was significantly helpful for her symptoms.  The patient contacted [place] shortly afterwards saying that the steroids were very effective for her.  We discussed a trial of hydroxychloroquine as a potential medicine for this. The patient did try hydroxychloroquine and unfortunately had fairly significant symptoms of dizziness that seem to coincide with starting the medicine.  She has stopped hydroxychloroquine and symptoms of dizziness are still present but gradually improving the further she gets around from it.  She prefers not to go forward with further hydroxychloroquine at this time unless absolutely necessary.    The patient does have some questions in terms of the nature of her underlying condition.  We had discussed potential underlying inflammatory tendinitis/spondyloarthropathy as a consideration, but also discussed that her symptoms may be related to mechanical causes as well.  The patient  overall prefers to minimize exposure to medications when possible.      REVIEW OF SYSTEMS:  Complete ROS is negative, except for those mentioned above in the Interval History    PROBLEM LIST:  Patient Active Problem List    Diagnosis Date Noted    Traumatic complete tear of right rotator cuff [S46.011A] 03/27/2019    Other sprain of right shoulder  joint, sequela [S43.491S] 02/04/2019    Bilateral ankle joint pain [M25.571, M25.572] 02/04/2019    Migraine with vertigo [G43.109] 01/18/2018    Migraine without aura and without status migrainosus, not intractable [G43.009] 01/18/2018    Chronic daily headache [R51.9] 01/18/2018    Chronic pain of both shoulders [M25.511, G89.29, M25.512] 06/19/2017    Somatic dysfunction of upper extremity [M99.07] 06/19/2017    Degenerative spondylolisthesis [M43.10] 11/25/2016    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] 11/25/2016    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      Functional dyspepsia [K30] 07/19/2016    Pelvic floor dysfunction [M62.89] 05/04/2015    Chronic idiopathic constipation [K59.04] 05/04/2015    Nausea [R11.0] 05/04/2015    OAB (overactive bladder) [N32.81] 03/05/2015     Doing 2 studies at [facility]    Last Assessment & Plan:   Evaluation by urology Dr. 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 No changes, encouraged HEP, continue core strength.       Osteoporosis [M81.0] 11/21/2014    Irritable bowel syndrome [K58.9] 08/25/2014    Hypertension [I10] 07/07/2014     Overview:   Borderline     Last Assessment & Plan:   Formatting of this note might be different from the original.  Improved on low dose ARB -but higher dose exacerbates BPPV symptoms -  Added HCTZ 05/2011 and saw cards 11/2011 -  Changed to low dose beta-blocker 10/2013 because of headache, but declined to start same as home BP log with SBP range 110-140  The current treatment appears effective;  continue present plan and medications.     BP Readings from Last 3 Encounters:   11/14/13 140/72   10/25/13 160/72   09/20/13 122/70       Migraine with aura and without status migrainosus, not intractable [G43.109] 12/03/2013     Last Assessment & Plan:   Patient has been responding very well to osteopathic manipulation. Encourage patient to continue the home exercises and continue to work posture  changes. We discussed ergonomic changes during the workday. Patient will also make these changes when driving. Patient will come back and see me again in 6 weeks for further evaluation and treatment.      Hyperlipidemia [E78.5] 01/21/2010     Overview:   Mild       Last Assessment & Plan:   Treat BP if elevated         SOCIAL/FAMILY HISTORY:  Reviewed with patient. No changes from what is currently noted in EPIC    ALLERGIES:  Review of patient's allergies indicates:  Allergies   Allergen Reactions    Adhesives Skin: Itching    Other (See Comments)      Other reaction(s): Adverse Drug Reaction       MEDICATIONS:  Current Outpatient Medications   Medication Sig Dispense Refill    amLODIPine 2.5 MG tablet Take 1 tablet (2.5 mg) by mouth daily. 90 tablet 3    chlorthalidone 25 MG tablet Take 0.5 tablets (12.5 mg) by mouth daily. For high blood pressure. 45 tablet 3    estradiol 0.1 MG/GM vaginal cream Place 2 g into the vagina  at bedtime 2 times a week (Patient not taking: Reported on 10/01/2019) 42.5 g 5    fluocinolone 0.01 % otic oil Place 4 drops to each ear 2 times a day as needed (itchy ears). 1 bottle 2    hydroxychloroquine 200 MG tablet TAKE 1 TABLET(200 MG) BY MOUTH DAILY 90 tablet 1    losartan 100 MG tablet Take 1 tablet (100 mg) by mouth daily. 90 tablet 3    methylPREDNISolone 4 MG tablet Take 3 tabs daily x 5 days, then 2 tabs daily x 5 days, then 1 tab (57m) daily x 5 days, then stop 30 tablet 0    nystatin-triamcinolone 100000-0.1 UNIT/GM-% cream Apply topically 2 times a day. Apply to itchy ears, BID as needed. 60 g 2    Trospium Chloride ER 60 MG capsule extended-release 24 hour        No current facility-administered medications for this visit.        PHYSICAL EXAMINATION:  Vital signs:  BP 137/62    Pulse 62    Wt 53.5 kg (118 lb)    BMI 22.30 kg/m   General:  Awake, alert, and oriented, no apparent distress, pleasant, and cooperative  Psychologic:  Mood is euthymic, affect is congruent  EYES: Anicteric sclera, no conjunctival injection  ENT:  Normocephalic, atraumatic, moist membranes, no oral ulcers   Pulmonary:  Non-labored breathing  Skin:  Normal temperature and texture, No visible rashes, ulcers, or nodules  Neurologic:  Light touch sensation is grossly intact. Face symmetric   Musculoskeletal:     Gait: Normal      LABS:  Labs are pending at the end of today's evaluation    Results for orders placed or performed in visit on 11/19/19   HLA B*27 Typing (Sendout)   Result Value Ref Range    HLA B*27 Result (Sendout) Negative     HLA B*27 Typing Testing Lab SEE NOTES    Anti C. Citrullinated Peptide 2   Result Value Ref Range    Anti CCP2 Level <0.5 0.0 - 2.9 U/mL    Anti CCP2 Comment Negative    ANA Reflex Comprehensive Panel   Result Value Ref Range    Antibodies to Nuclear Ags by IF (ANA) Negative NRN    ANA Pattern by IF None     Hep2 Cytoplasmic Pattern  Negative by IFA. NRNIFA    Ana Screen By Multiplex  Negative NRN    Ana Interpretation Comment 1 Screen negative by IFA and multiplex.    Rheumatoid Factor   Result Value Ref Range    Rheumatoid Factor <10 <15 [IU]/mL   Protein Electrophoresis Reflexive Panel   Result Value Ref Range    Protein (Total) 6.6 6.0 - 8.2 g/dL    Albumin 4.5 3.5 - 4.9 g/dL    Alpha 1 0.2 0.1 - 0.3 g/dL    Alpha 2 0.6 0.3 - 0.8 g/dL    Beta 0.6 0.6 - 1.0 g/dL    Gamma 0.7 0.4 - 1.4 g/dL    Electrophoresis Interp: Normal pattern. NORPAT   Erythrocyte Sedimentation Rate   Result Value Ref Range    Erythrocyte Sedimentation Rate 17 0 - 20 mm/h   C-Reactive Protein   Result Value Ref Range    C_Reactive Protein 2.9 0.0 - 10.0 mg/L       IMAGING/DIAGNOSTIC RESULTS:   MRI of the lumbar spine from 11/15/2016  IMPRESSION:  1.  Grade 1 spondylolisthesis of L4 on L5 with moderate disc degeneration and severe facet arthropathy causing moderate right lateral recess and foraminal stenosis impinging on the descending right L5 nerve root and exiting right L4 nerve root  2.  Severe bilateral L5-S1 and L3-L4 facet arthropathy  3.  Grade 1 spondylolisthesis of L4 on L5 without spondylosis identified, likely related to ligamentous laxity from facet arthropathy  4.  Probable transverse fracture of S3.  Sacrum is incompletely assessed in the exam.  Recommend clinical correlation regarding the need for formal imaging on the sacrum  5.  1 cm multiloculated ganglion is noted in the interspinous space between L4 and L5 spinous processes likely related to chronic repetitive trauma      ASSESSMENT AND PLAN:  Teniya Filter is a very pleasant 79 year old female who presents for follow up of positive CCP test and concern for potential underlying rheumatoid arthritis    Positive CCP test, Arthralgias, concern for inflammatory tendinitis  The patient is a very pleasant woman who has mildly positive CCP test on recent outside laboratory work.  She has negative rheumatoid factor and normal inflammatory markers and  does not have inflammatory pattern peripheral arthritis consistent with rheumatoid arthritis at this time.  She does have fairly longstanding low back pain with multiple abnormalities noted on a lumbar MRI from August 2018 and symptoms generally correspond to this.  She also has been having some bilateral plantar fasciitis symptoms that have improved with appropriate therapy.     At her initial appointment, the patient did not have symptoms that were consistent with rheumatoid arthritis and was felt that she likely had mechanical issues with her back and bilateral CMC joints.      At the patient's last visit, she had seemed to have more issues regarding musculoskeletal pain localized at points of tendon insertions and soft tissue such as plantar fascia, bilateral trochanteric bursitis, shoulder symptoms, as well as history of tendinitis in the Achilles tendon bilateral rotator cuff.  We had wondered about a potential underlying inflammatory tendinitis syndrome although labs for this were unremarkable with a negative set of inflammatory markers, negative rheumatoid factor, negative CCP and a negative HLA-B27.  She did have a relatively good response to steroids while on them but did not tolerate hydroxychloroquine due to dizziness.  We did discuss that there may be some underlying ambiguity relating underlying etiology of  her symptoms and discussed either approaching it from a perspective of presumed mechanical cause versus presumed inflammatory cause.  We discussed physical therapy exercises and conservative therapy prior to considering medications such as sulfasalazine or methotrexate.    I spent a total of 34 minutes for the patient's care on the date of the service.         All the patient's questions were answered. The patient will follow up in 3-4 months    CC: PCP    Dictated using speech recognition technology and electronically signed by Robin Hart. Robin Calix, MD   Note: This document may contain errors inherent to  this technology. If you find any errors that might affect patient care, please contact our office as soon as possible.   [address], [place], [place]  Ph. [phone] Fx. [phone]

## 2019-12-17 NOTE — Patient Instructions (Signed)
It was a pleasure seeing you in clinic today.     Continue your current medications   Follow up in 3-4 months. You can cancel if doing better    If you have any questions or concerns before your next appointment, please do not hesitate to contact me. Our clinic phone number is [phone] and I am also available via [facility].

## 2020-01-22 ENCOUNTER — Other Ambulatory Visit: Payer: Self-pay

## 2020-03-18 ENCOUNTER — Ambulatory Visit (INDEPENDENT_AMBULATORY_CARE_PROVIDER_SITE_OTHER): Payer: Medicare PPO | Admitting: Orthopaedic Surgery

## 2020-03-18 ENCOUNTER — Encounter (INDEPENDENT_AMBULATORY_CARE_PROVIDER_SITE_OTHER): Payer: Self-pay | Admitting: Orthopaedic Surgery

## 2020-03-18 ENCOUNTER — Encounter (INDEPENDENT_AMBULATORY_CARE_PROVIDER_SITE_OTHER): Payer: Self-pay | Admitting: Internal Medicine

## 2020-03-18 VITALS — BP 100/60 | Temp 98.3°F

## 2020-03-18 DIAGNOSIS — M25572 Pain in left ankle and joints of left foot: Secondary | ICD-10-CM

## 2020-03-18 DIAGNOSIS — M25511 Pain in right shoulder: Secondary | ICD-10-CM

## 2020-03-18 DIAGNOSIS — G8929 Other chronic pain: Secondary | ICD-10-CM

## 2020-03-18 DIAGNOSIS — M5441 Lumbago with sciatica, right side: Secondary | ICD-10-CM

## 2020-03-18 DIAGNOSIS — M5417 Radiculopathy, lumbosacral region: Secondary | ICD-10-CM

## 2020-03-18 DIAGNOSIS — M25571 Pain in right ankle and joints of right foot: Secondary | ICD-10-CM

## 2020-03-18 DIAGNOSIS — Z9889 Other specified postprocedural states: Secondary | ICD-10-CM

## 2020-03-18 DIAGNOSIS — M1990 Unspecified osteoarthritis, unspecified site: Secondary | ICD-10-CM

## 2020-03-18 DIAGNOSIS — M25512 Pain in left shoulder: Secondary | ICD-10-CM

## 2020-03-18 NOTE — Progress Notes (Signed)
Initially scheduled for her left knee but saw her PCP for this and doing better.     She has low back pain that radiates to her lateral right leg to her foot and ankle. Feels like she supinates her right foot.     The soles of her feet hurt. No numbness or tingling.     Dr. Arlington Calix said she has B/L hip trochanteric bursitis.     Mid back pain, aggravated with getting up from laying supine.     Right shoulder pain is still painful after surgery.     She just wants to talk about her body and what she should do to help.     Last seen: 05/13/19  Treatments have included:    Medications: aleve prn helps, did a trial of prednisone and this helped her pain but didn't want to stay on this.    Injections: none   Therapies: stretches at home daily, ice/heat occasionally   Surgery: right shoulder 04/2019   Imaging:    MRI L Spine 11/15/16  XR Ankle 2 Views Left1/20/2020  MRI Shoulder Right 02/22/2019

## 2020-03-19 MED ORDER — CELECOXIB 50 MG OR CAPS
50.0000 mg | ORAL_CAPSULE | Freq: Two times a day (BID) | ORAL | 2 refills | Status: DC
Start: 2020-03-19 — End: 2020-12-22

## 2020-03-24 DIAGNOSIS — Z9889 Other specified postprocedural states: Secondary | ICD-10-CM | POA: Insufficient documentation

## 2020-03-24 NOTE — Progress Notes (Signed)
03/18/2020    Burr Medico  [id number]    IDENTIFYING DATA:  Robin Hart is a pleasant 79 year old female who is known to me last seen on 05/13/1999.  Returns today to discuss musculoskeletal issues.    INTERIM HISTORY:  Yee initially made the appointment to discuss left knee pain but this is overall doing better after she saw her PCP.  She continues have low back pain that does cause some referral towards her right lateral leg and foot.  She feels like she is supinates her foot too much which contributes to her symptoms more proximally.  She also does have pain in the soles of her feet although she does not really complain of any numbness or tingling in her feet.  Does have lateral hip discomfort as well in her rheumatologist feels that she has trochanteric bursitis.  Overall feels stiff in the morning has difficulty walking in the morning but does get a little bit better when she stretches and does some movements and exercise.  Also had shoulder surgery and does still have some pain but doing home exercises and stretches regularly and also using ice and heat which does seem to help and it seems to be getting better.  She just wants to talk about her body and what she should do to help.   Treatments have included:    Medications: aleve prn helps, did a trial of prednisone and this helped her pain but didn't want to stay on this.    Injections: none   Therapies: stretches at home daily, ice/heat occasionally   Surgery: right shoulder 04/2019   Imaging:  See Epic.     PMH/PSH/MEDICATIONS/FAMILY MEDICAL HISTORY/SOCIAL HISTORY:  See chart. Pertient changes described above.     MEDICATIONS/ALLERGIES:  See chart.     REVIEW OF SYSTEMS:  See Interim History    PHYSICAL EXAMINATION:  General: Awake, alert, and oriented, no apparent distress, pleasant, and cooperative  Vitals: Blood pressure 100/60, temperature 36.8 C, temperature source Temporal, not currently breastfeeding.There is no height  or weight on file to calculate BMI.  Neurologic/Musculoskeletal: Gait non-antalgic.  Repeat provocative testing was not done today    ASSESSMENT:  (M54.41,  G89.29) Chronic bilateral low back pain with right-sided sciatica  (primary encounter diagnosis)  (M54.17) Lumbosacral radiculopathy at L5  (M25.511,  G89.29,  M25.512) Chronic pain of both shoulders  (M25.571,  M25.572) Bilateral ankle joint pain  (Z98.890) Status post shoulder surgery    PLAN:  1. We discussed her symptoms in general and also had a long discussion about further treatment options both conservative and possible interventional.  2. At this point she will follow some of her recommendations, but does not feel there is any indication at this point for any intervention specifically.  Will make an appointment with Dr. Wynetta Emery to evaluate her feet.   3. Yasheka can follow-up with [place] as needed, and call or email with any questions or concerns and we can then direct further treatment options.    I spent a total of 25 minutes for the patient's care on the date of the service.            Harsha Yusko Tomasa Rand, D.O.   Board certified - Physical Medicine & Rehabilitation      Portions of this document were created with voice-recognition software

## 2020-04-14 ENCOUNTER — Encounter (INDEPENDENT_AMBULATORY_CARE_PROVIDER_SITE_OTHER): Payer: Self-pay | Admitting: Orthopaedic Surgery

## 2020-04-14 ENCOUNTER — Encounter (INDEPENDENT_AMBULATORY_CARE_PROVIDER_SITE_OTHER): Payer: Medicare PPO | Admitting: Internal Medicine

## 2020-04-20 ENCOUNTER — Encounter (INDEPENDENT_AMBULATORY_CARE_PROVIDER_SITE_OTHER): Payer: Self-pay | Admitting: Internal Medicine

## 2020-04-21 NOTE — Telephone Encounter (Signed)
I consulted with Dr. Guido Sander:  Guido Sander, [place], DO  You 7 days ago     Yes. She can stop taking it and see how things go. If pain gets worse, resume and we may even increase the dose. If no difference once she has stopped don't take it any more

## 2020-04-23 ENCOUNTER — Telehealth (INDEPENDENT_AMBULATORY_CARE_PROVIDER_SITE_OTHER): Payer: Medicare PPO | Admitting: Internal Medicine

## 2020-04-23 DIAGNOSIS — M7062 Trochanteric bursitis, left hip: Secondary | ICD-10-CM

## 2020-04-23 DIAGNOSIS — G8929 Other chronic pain: Secondary | ICD-10-CM

## 2020-04-23 DIAGNOSIS — M7061 Trochanteric bursitis, right hip: Secondary | ICD-10-CM

## 2020-04-23 DIAGNOSIS — M545 Low back pain, unspecified: Secondary | ICD-10-CM

## 2020-04-23 NOTE — Progress Notes (Signed)
[place]  [address]  TEL: [phone]  l  FAX: [phone]    COVID-19 Telemedicine Note      NOTE:  I decided to perform this telemedicine encounter in lieu of the patient's scheduled in-person encounter with my clinic due to the current COVID-19 outbreak and the risk that an in-person visit would pose to the patient, clinic staff, or the general public.    I conducted this encounter from [facility] via secure, live, face-to-face video conference with the patient. Robin Hart was located at her home by herself.  Prior to the interview, the risks and benefits of telemedicine were discussed with the patient and verbal consent was obtained.      04/23/2020    IDENTIFYING DATA:  MRN: [id number]  DOB: [birth year]    CHIEF COMPLAINT:      Robin Hart is a very pleasant 80 year old female who presents for follow up of positive CCP test and concern for potential underlying connective tissue disease      RHEUMATOLOGIC HISTORY OF PRESENT ILLNESS:  The patient was initially seen in clinic on January 26, 2019.  She carries known diagnosis of low back pain with right-sided L4 and L5 radiculopathy with corresponding facet arthritis and impingement noted on previous MRI on 11/15/2016.  She is seen Dr. Harlin Heys for this in the past and at the current time she is managing this with physical therapy and a very seldom dose of naproxen.  She feels that her back pain in general has been overall stable and not worsening to the point that she would consider epidural steroid injection or surgery.    The patient developed bilateral plantar fasciitis left greater than right in October 2019.  She saw physical therapy and podiatry, received 2 steroid injections and reports that the symptoms have been getting better.  She also reports having some longstanding discomfort in the bilateral first MCP joints without any swelling, redness, warmth and this is currently managed  using occasional topical diclofenac.  She also has had some right shoulder discomfort especially with raising her arm above the head that is managed by physical therapy.  She notes some mild morning stiffness in her back and plantar fascia region that is generally 20 minutes or less.  She denies any history of dactylitis, psoriasis, rashes, inflammatory bowel disease, inflammatory eye disease, or recurrent tendinitis.  She does report history of irritable bowel syndrome.  Overall she denies any joint swelling, redness, or warmth or morning stiffness lasting longer than 20 minutes.  She denies any family history of rheumatoid arthritis or other inflammatory arthritis conditions.    The patient's podiatrist did do a preliminary autoimmune work-up due to her bilateral plantar fasciitis that showed a negative ANA, negative rheumatoid factor, mildly elevated CCP autoantibody of 35, and an ESR of 12 and CRP of 3.      INTERVAL HISTORY:  The patient was last seen in clinic on December 17, 2019.  At that time she had recently had an episode of fairly widespread polyarthralgia symptoms involving bilateral greater trochanteric region, plantar fascia, Achilles tendon, and rotator cuff.  We discussed doing corticosteroid taper and seeing if it is helpful for symptoms.  Steroids were very effective and we discussed a trial of hydroxychloroquine but unfortunately this medication when she tried it caused fairly significant dizziness.  At the time that we discussed her case, the dizziness was still present  but gradually getting better.  She and I had some long discussions regarding the underlying condition that she may have which could possibly be an inflammatory spondyloarthropathy versus more mechanical causes of her pain given her age.  Given that there was some underlying ambiguity regarding underlying etiology of her symptoms and discussed either approaching it from the perspective of her treatment mechanical causes versus  presumed inflammation, we discussed doing physical therapy and conservative therapy prior to considering more aggressive medicines like methotrexate or sulfasalazine.    The patient has been on 50 mg of celecoxib for the last few months and is tolerating this fine but is having a modest effect if any in terms of her low back and hip pain.  She still is having some discomfort in her shoulders although this is more mild and nature.  She has not yet pursued physical therapy but has been doing some exercises I provided for her at home which she does feel have made a potential positive impact.  She would like to do some formal physical therapy at this time as well as potentially stop with celecoxib and compare which is more effective for her.      REVIEW OF SYSTEMS:  Complete ROS is negative, except for those mentioned above in the Interval History    PROBLEM LIST:  Patient Active Problem List    Diagnosis Date Noted    Status post shoulder surgery [Z98.890] 03/24/2020    Traumatic complete tear of right rotator cuff [S46.011A] 03/27/2019    Other sprain of right shoulder joint, sequela [S43.491S] 02/04/2019    Bilateral ankle joint pain [M25.571, M25.572] 02/04/2019    Migraine with vertigo [G43.109] 01/18/2018    Migraine without aura and without status migrainosus, not intractable [G43.009] 01/18/2018    Chronic daily headache [R51.9] 01/18/2018    Chronic pain of both shoulders [M25.511, G89.29, M25.512] 06/19/2017    Somatic dysfunction of upper extremity [M99.07] 06/19/2017    Degenerative spondylolisthesis [M43.10] 11/25/2016    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] 11/25/2016    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 No changes, encouraged HEP, continue core strength.       Osteoporosis [M81.0] 11/21/2014    Irritable bowel syndrome [K58.9] 08/25/2014    Hypertension [I10] 07/07/2014     Overview:   Borderline     Last Assessment & Plan:   Formatting of this note might be different from the original.  Improved on low dose ARB -but higher dose exacerbates BPPV symptoms -  Added HCTZ 05/2011 and saw cards 11/2011 -  Changed to low dose beta-blocker 10/2013 because of headache, but declined to start same as home BP log with SBP range 110-140  The current treatment appears effective;  continue present plan and medications.     BP Readings from Last 3 Encounters:   11/14/13 140/72   10/25/13 160/72   09/20/13 122/70       Migraine with aura and without status migrainosus, not intractable [G43.109] 12/03/2013     Last Assessment & Plan:    Patient has been responding very well to osteopathic manipulation. Encourage patient to continue the home exercises and continue to work posture changes. We discussed ergonomic changes during the workday. Patient will also make these changes when driving. Patient will come back and see me again in 6 weeks for further evaluation and treatment.      Hyperlipidemia [E78.5] 01/21/2010     Overview:   Mild       Last Assessment & Plan:   Treat BP if elevated         SOCIAL/FAMILY HISTORY:  Reviewed with patient. No changes from what is currently noted in EPIC    ALLERGIES:  Review of patient's allergies indicates:  Allergies   Allergen Reactions    Adhesives Skin: Itching    Hydroxychloroquine Dizziness    Other (See Comments)      Other reaction(s): Adverse Drug Reaction       MEDICATIONS:  Current Outpatient Medications   Medication Sig Dispense Refill    amLODIPine 2.5 MG tablet Take 1 tablet (2.5 mg) by mouth daily. 90 tablet 3    celecoxib 50 MG capsule Take 1 capsule (50 mg) by mouth 2 times a day. Take with food. 60 capsule 2    chlorthalidone 25 MG tablet Take 0.5 tablets (12.5 mg) by mouth daily. For high blood pressure. 45 tablet 3    diclofenac 1 % gel Apply 1 gm tid to affected area      estradiol 0.1 MG/GM vaginal cream Place 2 g into the vagina at bedtime 2 times a week 42.5 g 5    fluocinolone 0.01 % otic oil Place 4 drops to each ear 2 times a day as needed (itchy ears). 1 bottle 2    losartan 100 MG tablet Take 1 tablet (100 mg) by mouth daily. 90 tablet 3    nystatin-triamcinolone 100000-0.1 UNIT/GM-% cream Apply topically 2 times a day. Apply to itchy ears, BID as needed. 60 g 2    omega-3 (Fish Oil) 500 MG capsule Take by mouth.      Trospium Chloride ER 60 MG capsule extended-release 24 hour        No current facility-administered medications for this visit.           LABS:  Labs are pending at the end of today's evaluation    Results for orders placed or performed in visit on  11/19/19   HLA B*27 Typing (Sendout)   Result  Value Ref Range    HLA B*27 Result (Sendout) Negative     HLA B*27 Typing Testing Lab SEE NOTES    Anti C. Citrullinated Peptide 2   Result Value Ref Range    Anti CCP2 Level <0.5 0.0 - 2.9 U/mL    Anti CCP2 Comment Negative    ANA Reflex Comprehensive Panel   Result Value Ref Range    Antibodies to Nuclear Ags by IF (ANA) Negative NRN    ANA Pattern by IF None     Hep2 Cytoplasmic Pattern Negative by IFA. NRNIFA    Ana Screen By Multiplex Negative NRN    Ana Interpretation Comment 1 Screen negative by IFA and multiplex.    Rheumatoid Factor   Result Value Ref Range    Rheumatoid Factor <10 <15 [IU]/mL   Protein Electrophoresis Reflexive Panel   Result Value Ref Range    Protein (Total) 6.6 6.0 - 8.2 g/dL    Albumin 4.5 3.5 - 4.9 g/dL    Alpha 1 0.2 0.1 - 0.3 g/dL    Alpha 2 0.6 0.3 - 0.8 g/dL    Beta 0.6 0.6 - 1.0 g/dL    Gamma 0.7 0.4 - 1.4 g/dL    Electrophoresis Interp: Normal pattern. NORPAT   Erythrocyte Sedimentation Rate   Result Value Ref Range    Erythrocyte Sedimentation Rate 17 0 - 20 mm/h   C-Reactive Protein   Result Value Ref Range    C_Reactive Protein 2.9 0.0 - 10.0 mg/L       IMAGING/DIAGNOSTIC RESULTS:   MRI of the lumbar spine from 11/15/2016  IMPRESSION:  1.  Grade 1 spondylolisthesis of L4 on L5 with moderate disc degeneration and severe facet arthropathy causing moderate right lateral recess and foraminal stenosis impinging on the descending right L5 nerve root and exiting right L4 nerve root  2.  Severe bilateral L5-S1 and L3-L4 facet arthropathy  3.  Grade 1 spondylolisthesis of L4 on L5 without spondylosis identified, likely related to ligamentous laxity from facet arthropathy  4.  Probable transverse fracture of S3.  Sacrum is incompletely assessed in the exam.  Recommend clinical correlation regarding the need for formal imaging on the sacrum  5.  1 cm multiloculated ganglion is noted in the interspinous space between L4 and L5 spinous  processes likely related to chronic repetitive trauma      ASSESSMENT AND PLAN:  Robin Hart is a very pleasant 80 year old female who presents for follow up of positive CCP test and concern for potential underlying rheumatoid arthritis    Positive CCP test, Arthralgias, concern for inflammatory tendinitis  The patient is a very pleasant woman who has mildly positive CCP test on recent outside laboratory work.  She has negative rheumatoid factor and normal inflammatory markers and does not have inflammatory pattern peripheral arthritis consistent with rheumatoid arthritis at this time.  She does have fairly longstanding low back pain with multiple abnormalities noted on a lumbar MRI from August 2018 and symptoms generally correspond to this.  She also has been having some bilateral plantar fasciitis symptoms that have improved with appropriate therapy.     At her initial appointment, the patient did not have symptoms that were consistent with rheumatoid arthritis and was felt that she likely had mechanical issues with her back and bilateral CMC joints.  She had seemed to have more issues regarding musculoskeletal pain localized at points of tendon insertions and soft tissue such as plantar fascia, bilateral trochanteric bursitis,  shoulder symptoms, as well as history of tendinitis in the Achilles tendon bilateral rotator cuff.  We had wondered about a potential underlying inflammatory tendinitis syndrome although labs for this were unremarkable with a negative set of inflammatory markers, negative rheumatoid factor, negative CCP and a negative HLA-B27.  She did have a relatively good response to steroids while on them but did not tolerate hydroxychloroquine due to dizziness.  We did discuss that there may be some underlying ambiguity relating underlying etiology of her symptoms and discussed either approaching it from a perspective of presumed mechanical cause versus presumed inflammatory cause.      Since  her last appointment, the patient's been taking low doses of celecoxib on a daily basis and feels that this has made mild improvement in her symptoms overall but not as much as she would like.  She is interested in doing some formal physical therapy at this time which I think is completely reasonable  - Physical therapy referral will be sent to the patient's home.  We discussed core physical therapy in [place] as well as the [facility] physical therapy in our building is good options for her  - Consider increasing celecoxib to a higher dose at some point in the future      I spent a total of 25 minutes for the patient's care on the date of the service.         All the patient's questions were answered. The patient will follow up in 3-4 months    CC: PCP    Dictated using speech recognition technology and electronically signed by Whitney Post. Arlington Calix, MD   Note: This document may contain errors inherent to this technology. If you find any errors that might affect patient care, please contact our office as soon as possible.   [address], [address], [place], [place]  Ph. [phone] Fx. [phone]

## 2020-05-01 ENCOUNTER — Telehealth (INDEPENDENT_AMBULATORY_CARE_PROVIDER_SITE_OTHER): Payer: Self-pay | Admitting: Foot & Ankle Surgery

## 2020-05-01 NOTE — Telephone Encounter (Signed)
RETURN CALL: Voicemail - Detailed Message      SUBJECT:  Cancellation/Reschedule Request     REASON: Patient will be out of town on 1/31 and would like to reschedule to doctors first available date.   ADDITIONAL INFORMATION: [facility] attempted to warm transfer, but there were 6 calls in queue

## 2020-05-04 ENCOUNTER — Encounter (INDEPENDENT_AMBULATORY_CARE_PROVIDER_SITE_OTHER): Payer: Self-pay | Admitting: Foot & Ankle Surgery

## 2020-05-05 NOTE — Telephone Encounter (Signed)
Patient has been rescheduled.

## 2020-06-22 ENCOUNTER — Ambulatory Visit (INDEPENDENT_AMBULATORY_CARE_PROVIDER_SITE_OTHER): Admit: 2020-06-22 | Discharge: 2020-06-22 | Disposition: A | Discharge status: Home / Self Care | Payer: Medicare PPO

## 2020-06-22 ENCOUNTER — Ambulatory Visit (INDEPENDENT_AMBULATORY_CARE_PROVIDER_SITE_OTHER): Payer: Medicare PPO | Admitting: Foot & Ankle Surgery

## 2020-06-22 ENCOUNTER — Encounter (INDEPENDENT_AMBULATORY_CARE_PROVIDER_SITE_OTHER): Payer: Self-pay | Admitting: Foot & Ankle Surgery

## 2020-06-22 ENCOUNTER — Encounter (INDEPENDENT_AMBULATORY_CARE_PROVIDER_SITE_OTHER): Payer: Self-pay

## 2020-06-22 VITALS — BP 104/54 | HR 64 | Temp 97.6°F | Resp 16 | Ht 60.0 in | Wt 123.0 lb

## 2020-06-22 DIAGNOSIS — M79672 Pain in left foot: Secondary | ICD-10-CM

## 2020-06-22 DIAGNOSIS — M7742 Metatarsalgia, left foot: Secondary | ICD-10-CM

## 2020-06-22 DIAGNOSIS — M7671 Peroneal tendinitis, right leg: Secondary | ICD-10-CM

## 2020-06-22 DIAGNOSIS — M722 Plantar fascial fibromatosis: Secondary | ICD-10-CM

## 2020-06-22 DIAGNOSIS — M79671 Pain in right foot: Secondary | ICD-10-CM

## 2020-06-22 DIAGNOSIS — M5417 Radiculopathy, lumbosacral region: Secondary | ICD-10-CM

## 2020-06-22 DIAGNOSIS — M7741 Metatarsalgia, right foot: Secondary | ICD-10-CM

## 2020-06-22 NOTE — Progress Notes (Signed)
Robin Hart is here today to see Dr. Wynetta Emery.  Please see Dr. Durenda Age notes on this patient.  While she is here today I fit Robin Hart with a size 38 quick stride.  I modified this with increased Spenco top covering material for padding.  I also added 1/8 inch Korex forefoot extensions with first met accommodations on both left and right as well as 1/5 met accommodation on the right insert.  These were backfilled as well.  She will try these over the next 4 to 6 weeks in her [employer] shoes.  She will then return back on an as-needed basis with Dr. Wynetta Emery.  I spent about 15 minutes in the modification of these inserts.

## 2020-06-22 NOTE — Progress Notes (Signed)
Robin Hart  PCP:  Angelita Ingles, MD    Chief Complaint:  80 year old female with   Chief Complaint   Patient presents with    Right Foot - Pain     New patient. She reports pain in multiple areas of foot and ankle, right worse than left x 2 years. She relates no injury though she has issues with her low back and down the leg to great toe. She states no treatment for her foot and ankle pain.    Right Ankle - Pain    Left Foot - Pain    Left Ankle - Pain      HPI:    Location: Lateral aspect of both feet and ankles, right worse than left, especially at right 5th metatarsal head  Duration/Onset Date: 2-year duration  Injury Cause: No specific history of injury.  Progression: The problem is persisting, getting worse    Aggravating Factors: Weightbearing activities, standing, walking.  Treatment so far: Hoka rocker soled shoes for treatment of plantar fasciitis in 2019  Additional Information: The patient explains that she feels like her back caused her feet to her.    PAST MEDICAL HISTORY  Allergies:  Review of patient's allergies indicates:  Allergies   Allergen Reactions    Adhesives Skin: Itching    Hydroxychloroquine Dizziness    Other (See Comments)      Other reaction(s): Adverse Drug Reaction     Medications:  has a current medication list which includes the following prescription(s): amlodipine, celecoxib, chlorthalidone, diclofenac, estradiol, fluocinolone, losartan, nystatin-triamcinolone, omega-3, and trospium chloride er.    Past Medical History:   has a past medical history of Allergic rhinitis due to allergen, Arthritis, Cancer ([place]), Constipation, Diverticulosis, Glaucoma, H. pylori infection, Headache, History of diarrhea, History of irritable bowel syndrome, Hypertension, Lipidemia, Osteoporosis, and Primary insomnia.    Past Surgical History:   has a past surgical history that includes Bladder suspension; Basal cell carcinoma excision; pr colonoscopy stoma dx including collj spec  spx; pr total abdominal hysterect w/wo rmvl tube ovary (04/04/1996); and Breast biopsy (Left, 05/27/2015).    Family History:  family history includes Breast Cancer in her maternal aunt and sister; Cancer in her mother; Diabetes in her daughter; GI in her mother; Hypertension in her sister; Myocardial Infarction in her brother; No Known Problems in her father. There is no history of Migraine Headaches or Ovarian Cancer.    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   Tobacco Use    Smoking status: Never Smoker    Smokeless tobacco: Never Used   Substance and Sexual Activity    Alcohol use: Yes     Comment: occasionally    Drug use: No    Sexual activity: Not Currently   Other Topics Concern    Not on file   Social History Narrative    Not on file     Social Determinants of Health     Financial Resource Strain:     Difficulty of Paying Living Expenses: Not on file   Food Insecurity:     Worried About [place] in the Last Year: Not on file  Ran Out of Food in the Last Year: Not on file   Transportation Needs:     Lack of Transportation (Medical): Not on file    Lack of Transportation (Non-Medical): Not on file   Physical Activity:     Days of Exercise per Week: Not on file    Minutes of Exercise per Session: Not on file   Stress:     Feeling of Stress : Not on file   Social Connections:     Frequency of Communication with Friends and Family: Not on file    Frequency of Social Gatherings with Friends and Family: Not on file    Attends Religious Services: Not on file    Active Member of Clubs or Organizations: Not on file    Attends Archivist Meetings: Not on file    Marital Status: Not on file   Intimate Partner Violence:     Fear of Current or  Ex-Partner: Not on file    Emotionally Abused: Not on file    Physically Abused: Not on file    Sexually Abused: Not on file   Housing Stability:     Unable to Pay for Housing in the Last Year: Not on file    Number of [place] in the Last Year: Not on file    Unstable Housing in the Last Year: Not on file     ROS:  ROS is positive for musculoskeletal as mentioned in the HPI and as stated in the past medical history.      PHYSICAL EXAM:  BP (!) 104/54    Pulse 64    Temp 36.4 C (Temporal)    Resp 16    Ht 5' (1.524 m)    Wt 55.8 kg (123 lb)    SpO2 98%    BMI 24.02 kg/m     GENERAL: Well-developed, well nourished, pleasant female. Alert and oriented to person, place, and time.  No apparent distress.    VASCULAR:  Palpable dorsalis pedis and posterior tibial pulses, normal capillary refill immediate to toes, without evidence of vascular insufficiency.    NEUROLOGICAL:  Epicritic sensation intact.  No appreciable numbness noted.    DERMATOLOGICAL: The skin has normal temperature, turgor, texture.  No ecchymosis or erythema.    MUSCULOSKELETAL:   Pain is elicited on palpation of the right foot at multiple locations including the cuboid groove/peroneus longus tendon, right, as well as the fifth MTP joint, right worse than left.  Tenderness at the plantar fascial insertion of both feet, right worse than left.  Pain with inversion of the forefoot on the rear foot, right.  Tenderness at first MTP joint, right worse than left.  Manual muscle testing of extrinsic and intrinsic muscles groups reveals normal strength.  Range of motion is supple and without pain at the ankle and subtalar joints.  Ankle joint range of motion is supple without evidence of crepitus.  Foot type: Flexible high arch foot type, stands pronated, bilateral  Gait evaluation: Antalgic gait is noted.          DIAGNOSTIC IMAGING:    XR Ankle 2 View Left    Result Date: 06/22/2020  EXAMINATION: XR FOOT 3 VW LEFT, XR ANKLE 2 VW LEFT CLINICAL  INDICATION: foot and ankle pain COMPARISON: February 22, 2019 FINDINGS AND     Moderate first MTP joint osteoarthritis. Focal lucency is suggested in the first metatarsal head and neck junction, possibly representing underlying cyst versus focal lytic lesion. Plantar calcaneal  enthesophyte. Diffuse osteopenia.Intact ankle mortise. No soft tissue swelling about the ankle. No acute fracture.     XR Ankle 2 View Right    Result Date: 06/22/2020  EXAMINATION: XR ANKLE 2 VW RIGHT CLINICAL INDICATION: foot and ankle pain COMPARISON: No prior images are currently available for comparison. FINDINGS AND     There is no acute fracture or dislocation. There is no soft tissue swelling about the ankle. Ankle mortise is intact.    XR Foot 3 View Left    Result Date: 06/22/2020  EXAMINATION: XR FOOT 3 VW LEFT, XR ANKLE 2 VW LEFT CLINICAL INDICATION: foot and ankle pain COMPARISON: February 22, 2019 FINDINGS AND     Moderate first MTP joint osteoarthritis. Focal lucency is suggested in the first metatarsal head and neck junction, possibly representing underlying cyst versus focal lytic lesion. Plantar calcaneal enthesophyte. Diffuse osteopenia.Intact ankle mortise. No soft tissue swelling about the ankle. No acute fracture.     XR Foot 3 View Right    Result Date: 06/22/2020  EXAMINATION: XR FOOT 3 VW RIGHT CLINICAL INDICATION: foot and ankle pain COMPARISON: Ankle February 22, 2019 FINDINGS AND     Moderate first MTP joint osteoarthritis.No acute fracture or dislocation.     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 The patient was given an opportunity to ask questions.   The radiographs were reviewed with the patient and discussed.    The patient is most symptomatic at the right fifth metatarsal head plantarly; the radiographs reveal degenerative changes at the first metatarsophalangeal joint of both feet.    Note: The patient feels like her foot symptoms may be coming from her back issues.     The treatment recommendations discussed and carried out today included:    --Regular use of Quikstride arch supports within stable shoes.  See shoe information sheet and recommended shoe list.    --The devices were modified to include increased padding and first ray accommodation, bilateral and 5th ray accommodation, right, by onsite pedorthist, Adria Dill, today.    --Recommend calf stretches twice a day. See instructions.     --Treatment of inflammation:  Application of ice to affected area for 15 minutes twice a day.    --Future considerations include: Follow-up evaluation in 8 weeks regarding right lateral column symptoms/progress with QS inserts with first ray accommodation bilateral and fifth ray accommodation, right; consider injection therapy at first MTP joint, right worse than left; injection therapy right fifth MTP; MRI if symptoms fail to improve as expected.    --Communication was emphasized to the patient to call the office immediately if there is any worsening of their current status.    Patient Instructions   --Regular use of Quikstride arch supports within stable shoes.  See shoe information sheet and recommended shoe list.    --The devices were modified to include increased padding and first ray accommodation, bilateral and 5th ray accommodation, right, by onsite pedorthist, Adria Dill, today.    --Recommend calf stretches twice a day. See instructions.     --Treatment of inflammation:  Application of ice to affected area for 15 minutes twice a day.    --Please call  if your condition worsens or problems arise.                  Orders Placed This Encounter    XR Foot 3  View Right     AP, MO, LO, LATERAL right FOOT     Standing Status:   Future     Number of Occurrences:   1     Standing Expiration Date:   07/23/2020     Order Specific Question:   Reason for exam (include signs, symptoms, diagnosis, and medical history)?     Answer:   foot and ankle pain    XR Foot 3 View Left     AP, MO, LO, LATERAL left FOOT     Standing Status:   Future     Number of Occurrences:   1     Standing Expiration Date:   07/23/2020     Order Specific Question:   Reason for exam (include signs, symptoms, diagnosis, and medical history)?     Answer:   foot and ankle pain    XR Ankle 2 View Left     AP, MORTISE left ANKLE     Standing Status:   Future     Number of Occurrences:   1     Standing Expiration Date:   07/23/2020     Order Specific Question:   Reason for exam (include signs, symptoms, diagnosis, and medical history)?     Answer:   foot and ankle pain    XR Ankle 2 View Right     AP, MORTISE right ANKLE     Standing Status:   Future     Number of Occurrences:   1     Standing Expiration Date:   07/23/2020     Order Specific Question:   Reason for exam (include signs, symptoms, diagnosis, and medical history)?     Answer:   foot and ankle pain     TIME SPENT:  I spent a total of 30 minutes on this visit today, including chart review prior to the visit, face-to-face time with the patient, and documentation/coordination of care after the visit on date of service.      Follow up plan:  Return in about 8 weeks (around 08/17/2020) for check both feet.    Charm Barges, DPM, FACFAS  [facility]    Portions of this document were created with voice-recognition software and may contain occasional errant or misspelled words.

## 2020-06-22 NOTE — Patient Instructions (Addendum)
--  Regular use of Quikstride arch supports within stable shoes.  See shoe information sheet and recommended shoe list.    --The devices were modified to include increased padding and first ray accommodation, bilateral and 5th ray accommodation, right, by onsite pedorthist, Adria Dill, today.    --Recommend calf stretches twice a day. See instructions.     --Treatment of inflammation:  Application of ice to affected area for 15 minutes twice a day.    --Please call if your condition worsens or problems arise.

## 2020-07-13 ENCOUNTER — Encounter (HOSPITAL_BASED_OUTPATIENT_CLINIC_OR_DEPARTMENT_OTHER): Payer: Self-pay

## 2020-07-13 ENCOUNTER — Ambulatory Visit: Payer: Medicare PPO | Attending: Internal Medicine | Admitting: Rehabilitative and Restorative Service Providers"

## 2020-07-13 DIAGNOSIS — M545 Low back pain, unspecified: Secondary | ICD-10-CM | POA: Insufficient documentation

## 2020-07-13 DIAGNOSIS — K5904 Chronic idiopathic constipation: Secondary | ICD-10-CM | POA: Insufficient documentation

## 2020-07-13 DIAGNOSIS — M7061 Trochanteric bursitis, right hip: Secondary | ICD-10-CM

## 2020-07-13 DIAGNOSIS — G8929 Other chronic pain: Secondary | ICD-10-CM | POA: Insufficient documentation

## 2020-07-13 DIAGNOSIS — M7062 Trochanteric bursitis, left hip: Secondary | ICD-10-CM | POA: Insufficient documentation

## 2020-07-13 DIAGNOSIS — R262 Difficulty in walking, not elsewhere classified: Secondary | ICD-10-CM | POA: Insufficient documentation

## 2020-07-13 DIAGNOSIS — N3941 Urge incontinence: Secondary | ICD-10-CM | POA: Insufficient documentation

## 2020-07-13 DIAGNOSIS — M6289 Other specified disorders of muscle: Secondary | ICD-10-CM | POA: Insufficient documentation

## 2020-07-13 NOTE — Progress Notes (Signed)
PATIENT NAME: Robin Hart    PHYSICAL THERAPY     GENERAL VISIT INFORMATION    (R26.2) Difficulty in walking, not elsewhere classified  (primary encounter diagnosis)    (M70.61,  M70.62) Trochanteric bursitis of both hips    (M54.50,  G89.29) Chronic bilateral low back pain without sciatica    Referring Provider: Roselle Locus, *  Reason for Referral*: Bilateral  hip and back pain. Pt also has pain to her R foot.    VISIT COUNT   1     Certification From*: 16/07/37  Certification To*: 10/62/69  Date of Symptom Onset*: 04/23/20  Start of Care Date*: 07/13/20    EVALUATION    General Assessment  Certification From*: 48/54/62  Certification To*: 70/35/00  Date of Symptom Onset*: 04/23/20  Start of Care Date*: 07/13/20  Reason for Referral*: Bilateral  hip and back pain. Pt also has pain to her R foot.  Have you fallen in the past year?: Yes  How many times?:  (Pt fell playing with her grandchildren)  Were you injured?: No  Do you feel unsteady when standing or walking?: No  Fall Screen Date: 07/13/20  Based on screening, patient's fall risk is moderate.    Prior Level of Function  ADLs: Independent with all ADLs  IADLs: Independent with all IADLs  Patient lives with: son, DIL, grandchildren are currently living with her    Past Medical History:   Diagnosis Date    Allergic rhinitis due to allergen     Arthritis     Cancer ([place])     skin cell carcinoma     Constipation     Diverticulosis     Glaucoma     H. pylori infection     Headache     History of diarrhea     History of irritable bowel syndrome     Hypertension     Lipidemia     Osteoporosis     Primary insomnia        Social History     Social History Narrative    Not on file     R RTC surgery 2021. Hx of L plantar fasciitis.          SUBJECTIVE:  Patients Statement: Robin Hart is a 80 year old female who reports pain to her lower thoracic and lumbar region of her back. She can't find a stretch for her mid back. Pt also  has pain to her feet and B lateral hips.  Pain in her feet is most bothersome. Pt feels that her R foot is supinated. The orthotic helped to increase wtb on the medial aspect of the R foot. This decreased her R hip pain and min to her R foot. She also has R anterior ankle. Pt would like to walk more but pain limits her activity. B lateral hip weakness and pain.     Pt is doing pilates, walks and stretches. She is trying to manage her pain.     Pt needs to walk 20 minutes and then feels it helps. She doesn't know what turns on her feet or hip sx. Back could be sore when she wakes in the morning. Stretching helps [facility] or [place] with a foam roller.     Functional Impairments: Decreased tolerance for walking and painful with turning in bed.     OBJECTIVE MEASURES    Posture/Observation: Increased thoracic kyphosis. Min L trunk SB    Gait: Hoka. Gait with above  postural deviations. R foot ER.     ROM: PROM hip flexion WNL. ER WNL. IR R 15, L 20  SLR B 60    Strength:  B LE 5-/5, hip abd     Neuro Exam:  Light touch Sensation:  B LE intact  Seated dural slump B -    Palpation: Tenderness to the lateral sacral border and L posterior greater trochanter    Special tests:  Hip adduction drop test B +  Passive hip abd R 10, L 10    TREATMENT PROVIDED:  Evaluation and discussion of findings and plan of care.     90-90 Hip Lift in Passive FA IR with Balloon (No balloon used in the clinic). In hooklying.   Standing Supported Respiratory L AF IR. Attempted a 1" block. Cont with no block and no knee flexion/extension.     Standing posture to increase sense of wtb on her L lateral heel.     ASSESSMENT: This patient is a 80 yo female with back, hip and R foot pain that limits her walking. She also has pain to her back with turning in bed. Pt presents with B flexed ilium and decreased B hip abd/adduction in a neutral position. Pt to benefit from skilled PT to decrease her pain and increase her tolerance for walking.   This evaluation  would be classified as high complexity due to the amount of personal factors, elements addressed in the evaluation, and general clinical presentation. Skilled physical therapy is appropriate and their prognosis at this time is rated as good.       PLAN: Physical therapy to continue 1x/ 1-2 weeks x 90 days.  PT to consist of manual therapy, therapeutic exercises, therapeutic activities, neuro reeducation and gait training.    TREATMENT GOALS  Goals and Current Level of Function established on 07/13/2020  Short Term Goals to be met on 08/12/20  1. Hip extension to neutral   2. Able to abduct and abd B femurs in a neutral position.    Long Term Goals to be met by 10/11/20  1. Pt to resume walking with 50% decrease in her pain.     Patient assisted in establishing goals and Patient agrees with plan.      Christene Slates, MPT    Thank you Roselle Locus for the referral of your patient, Eugene Zeiders. If you have any questions or concerns regarding their plan of care or this report, please contact me at the office.     This note serves as a Neurosurgeon form that requires the referring provider to certify the need for therapy services furnished under this Plan of Treatment.  The signing provider is certifying the plan of care.

## 2020-07-27 ENCOUNTER — Ambulatory Visit (HOSPITAL_BASED_OUTPATIENT_CLINIC_OR_DEPARTMENT_OTHER): Payer: Medicare PPO | Admitting: Rehabilitative and Restorative Service Providers"

## 2020-07-27 DIAGNOSIS — M7062 Trochanteric bursitis, left hip: Secondary | ICD-10-CM

## 2020-07-27 DIAGNOSIS — M545 Low back pain, unspecified: Secondary | ICD-10-CM

## 2020-07-27 DIAGNOSIS — M7061 Trochanteric bursitis, right hip: Secondary | ICD-10-CM

## 2020-07-27 DIAGNOSIS — G8929 Other chronic pain: Secondary | ICD-10-CM

## 2020-07-27 DIAGNOSIS — R262 Difficulty in walking, not elsewhere classified: Secondary | ICD-10-CM

## 2020-07-27 NOTE — Progress Notes (Signed)
PATIENT NAME: Robin Hart    PHYSICAL THERAPY     GENERAL VISIT INFORMATION    (M70.61,  M70.62) Trochanteric bursitis of both hips  (primary encounter diagnosis)    (R26.2) Difficulty in walking, not elsewhere classified    (M54.50,  G89.29) Chronic bilateral low back pain without sciatica    Referring Provider: Roselle Locus, *       VISIT COUNT   2     Certification From*: 6/73/4193    Certification To*: 7/90/2409    Date of Symptom Onset*: 04/23/2020    Start of Care Date*: 07/13/2020      Past Medical History:   Diagnosis Date    Allergic rhinitis due to allergen     Arthritis     Cancer ([place])     skin cell carcinoma     Constipation     Diverticulosis     Glaucoma     H. pylori infection     Headache     History of diarrhea     History of irritable bowel syndrome     Hypertension     Lipidemia     Osteoporosis     Primary insomnia        Social History     Social History Narrative    Not on file     R RTC surgery 2021. Hx of L plantar fasciitis.          SUBJECTIVE:    Feels the same. Doesn't understand the exercise. Sx fluctuate. Body does better if she moves more (walking or playing with her grandchildren).    OBJECTIVE MEASURES    Special tests:  Hip adduction drop test B + (B -5-10 degrees)  PADT B +  Passive hip abd B limited to approx 10 degrees    TREATMENT PROVIDED:  HEP: supine with knee to the opposite shoulder, hamstring stretch, DKC, bridging. hoklying df with toes wide and push wash cloth down. Pilates on Tues and teacher is working on getting her R toes to point straight vs toed out.     90-90 Hip Lift in Passive FA IR with Balloon (No balloon used in the clinic). In hooklying.   Standing Supported Respiratory L AF IR. 1" block with min sensation of a stretch.   Added standing wall supported reach (no chair)  Standing posture to increase sense of wtb on her L lateral heel and pelvis to be pointing straight forward and not to the R.     Multiple changes to the lacing  of her shoe. Widened the cross of the laces to the R midfoot with decreased pressure of the R MLA. Pt to try this and she may also try to lower the laces up the shoe for another possibility.     ASSESSMENT: The more open lace on the R shoe helped to decrease pressure of the orthotic into her R MLA. Pt appeared to understand how to perform the ex after todays session. Pt tends to stand with her trunk rotated to the R with increased wtb on the R LE. Hopefully, she will able to wtb more on the L to help unload her R LE.     PLAN: Cont to progress ex.     TREATMENT GOALS  Goals and Current Level of Function established on 07/13/2020  Short Term Goals to be met on 08/12/20  1. Hip extension to neutral   2. Able to abduct and abd B femurs in a neutral position.  Long Term Goals to be met by 10/11/20  1. Pt to resume walking with 50% decrease in her pain.     Patient assisted in establishing goals and Patient agrees with plan.      Christene Slates, MPT

## 2020-08-03 ENCOUNTER — Encounter (HOSPITAL_BASED_OUTPATIENT_CLINIC_OR_DEPARTMENT_OTHER): Payer: Self-pay | Admitting: Rehabilitative and Restorative Service Providers"

## 2020-08-10 ENCOUNTER — Encounter (HOSPITAL_BASED_OUTPATIENT_CLINIC_OR_DEPARTMENT_OTHER): Payer: Self-pay | Admitting: Rehabilitative and Restorative Service Providers"

## 2020-08-20 ENCOUNTER — Encounter (INDEPENDENT_AMBULATORY_CARE_PROVIDER_SITE_OTHER): Payer: Medicare PPO | Admitting: Foot & Ankle Surgery

## 2020-08-20 ENCOUNTER — Encounter (HOSPITAL_BASED_OUTPATIENT_CLINIC_OR_DEPARTMENT_OTHER): Payer: Self-pay | Admitting: Rehabilitative and Restorative Service Providers"

## 2020-08-20 NOTE — Progress Notes (Deleted)
[facility]  PCP:  Angelita Ingles, MD    Chief Complaint:  80 year old female with   No chief complaint on file.     HPI: The patient returns for follow-up evaluation regarding multiple foot issues including symptoms at the lateral lateral midfoot of both feet and ankles, right worse than left, especially at right 5th metatarsal head, as well as plantar fasciitis onset 2 years ago.  Prior treatment has included:  [place] rocker soled shoes for treatment of plantar fasciitis in 2019; Quikstride arch supports with first ray accommodation bilateral and fifth ray accommodation right.    She***    Note: The patient feels*** like her back caused her feet to hurt.    PAST MEDICAL HISTORY  Allergies:  Review of patient's allergies indicates:  Allergies   Allergen Reactions   . Adhesives Skin: Itching   . Hydroxychloroquine Dizziness   . Other (See Comments)      Other reaction(s): Adverse Drug Reaction     Medications:  has a current medication list which includes the following prescription(s): amlodipine, celecoxib, chlorthalidone, diclofenac, estradiol, fluocinolone, losartan, nystatin-triamcinolone, omega-3, and trospium chloride er.    Past Medical History:   has a past medical history of Allergic rhinitis due to allergen, Arthritis, Cancer ([place]), Constipation, Diverticulosis, Glaucoma, H. pylori infection, Headache, History of diarrhea, History of irritable bowel syndrome, Hypertension, Lipidemia, Osteoporosis, and Primary insomnia.    Past Surgical History:   has a past surgical history that includes Bladder suspension; Basal cell carcinoma excision; pr colonoscopy stoma dx including collj spec spx; pr total abdominal hysterect w/wo rmvl tube ovary (04/04/1996); and Breast biopsy (Left, 05/27/2015).    Family History:  family history includes Breast Cancer in her maternal aunt and sister; Cancer in her mother; Diabetes in her daughter; GI in her mother; Hypertension in her sister; Myocardial Infarction in  her brother; No Known Problems in her father. There is no history of Migraine Headaches or Ovarian Cancer.    ROS:  ROS is positive for musculoskeletal as mentioned in the HPI and as stated in the past medical history.      PHYSICAL EXAM:  There were no vitals taken for this visit.    GENERAL:  The patient is alert and oriented to person, place, and time.  No apparent distress.    VASCULAR:  Pedal pulses are palpable.  Feet are warm to touch with normal capillary refill.  No vasospasm present.    NEUROLOGICAL:  Sensation*** is intact to light touch, bilateral.  No hypersensitivity to light touch.      DERMATOLOGICAL:    Normal skin temperature turgor and texture.  No erythema or ecchymosis.      MUSCULOSKELETAL:   Tenderness noted on palpation of the ***    *** multiple locations including the cuboid groove/peroneus longus tendon, right, as well as the fifth MTP joint, right worse than left.   *** Tenderness at the plantar fascial insertion of both feet, right worse than left.   *** Pain with inversion of the forefoot on the rear foot, right.    ***Tenderness at first MTP joint, right worse than left.    Manual muscle testing of extrinsic and intrinsic muscles groups reveals normal strength.  Range of motion is supple and without pain at the ankle and subtalar joints.  Ankle joint range of motion is supple without evidence of crepitus.  Foot type: Flexible high arch foot type, stands pronated, bilateral  Gait evaluation: Gait improved***.  DIAGNOSTIC IMAGING:    XR Ankle 2 View Left    Result Date: 06/22/2020  EXAMINATION: XR FOOT 3 VW LEFT, XR ANKLE 2 VW LEFT CLINICAL INDICATION: foot and ankle pain COMPARISON: February 22, 2019 FINDINGS AND     Moderate first MTP joint osteoarthritis. Focal lucency is suggested in the first metatarsal head and neck junction, possibly representing underlying cyst versus focal lytic lesion. Plantar calcaneal enthesophyte. Diffuse osteopenia.Intact ankle mortise. No soft tissue  swelling about the ankle. No acute fracture.     XR Ankle 2 View Right    Result Date: 06/22/2020  EXAMINATION: XR ANKLE 2 VW RIGHT CLINICAL INDICATION: foot and ankle pain COMPARISON: No prior images are currently available for comparison. FINDINGS AND     There is no acute fracture or dislocation. There is no soft tissue swelling about the ankle. Ankle mortise is intact.    XR Foot 3 View Left    Result Date: 06/22/2020  EXAMINATION: XR FOOT 3 VW LEFT, XR ANKLE 2 VW LEFT CLINICAL INDICATION: foot and ankle pain COMPARISON: February 22, 2019 FINDINGS AND     Moderate first MTP joint osteoarthritis. Focal lucency is suggested in the first metatarsal head and neck junction, possibly representing underlying cyst versus focal lytic lesion. Plantar calcaneal enthesophyte. Diffuse osteopenia.Intact ankle mortise. No soft tissue swelling about the ankle. No acute fracture.     XR Foot 3 View Right    Result Date: 06/22/2020  EXAMINATION: XR FOOT 3 VW RIGHT CLINICAL INDICATION: foot and ankle pain COMPARISON: Ankle February 22, 2019 FINDINGS AND     Moderate first MTP joint osteoarthritis.No acute fracture or dislocation.     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 The patient was given an opportunity to ask questions.  Prior*** radiographs were reviewed with the patient and discussed.    ***The patient is most symptomatic at the right fifth metatarsal head plantarly; the radiographs reveal degenerative changes at the first metatarsophalangeal joint of both feet.    ***Note: The patient feels like her foot symptoms may be coming from her back issues.     Recommended  treatment plan at this time includes the following:    There are no Patient Instructions on file for this visit.    ***  --Regular use of Quikstride arch supports within stable shoes.  See shoe information sheet and recommended shoe list.    --The devices were modified to include increased padding and first ray accommodation, bilateral and 5th ray accommodation, right, by onsite pedorthist, Adria Dill, today.    --Recommend calf stretches twice a day. See instructions.     --Treatment of inflammation:  Application of ice to affected area for 15 minutes twice a day.    --Future considerations include: ***Follow-up evaluation in 8 weeks regarding right lateral column symptoms/progress with QS inserts with first ray accommodation bilateral and fifth ray accommodation, right; consider injection therapy at first MTP joint, right worse than left; injection therapy right fifth MTP; MRI if symptoms fail to improve as expected.    --Communication was emphasized to the patient to call the office immediately if there is any worsening of their current status.  There are no Patient Instructions on file for this visit.    No orders of the defined types were placed in this encounter.    TIME SPENT:  I spent a total of *** minutes on this visit today, including chart review prior to the visit, face-to-face time with the patient, and documentation/coordination of care after the visit on date of service.      Excluding the time spent performing the procedure, I spent a total of *** minutes on this visit today, including chart review prior to the visit, face-to-face time with the patient, and documentation/coordination of care after the visit on date of service.       Follow up plan:  No follow-ups on file.    Charm Barges, DPM, FACFAS  [facility]    Portions of this document were created with voice-recognition software and may contain occasional errant or misspelled words.

## 2020-09-30 ENCOUNTER — Encounter (INDEPENDENT_AMBULATORY_CARE_PROVIDER_SITE_OTHER): Payer: Self-pay | Admitting: Foot & Ankle Surgery

## 2020-09-30 ENCOUNTER — Ambulatory Visit (INDEPENDENT_AMBULATORY_CARE_PROVIDER_SITE_OTHER): Payer: Medicare PPO | Admitting: Foot & Ankle Surgery

## 2020-09-30 VITALS — BP 139/65 | HR 56 | Resp 16

## 2020-09-30 DIAGNOSIS — M7741 Metatarsalgia, right foot: Secondary | ICD-10-CM

## 2020-09-30 NOTE — Patient Instructions (Signed)
--  Recommend regular use of arch supports/orthotics in stable shoes, even in the home.     --Recommend insert modification with onsite pedorthist including valgus tilt.    --Please call if your condition worsens or problems arise.

## 2020-09-30 NOTE — Progress Notes (Signed)
[facility]  PCP:  Angelita Ingles, MD    Chief Complaint:  80 year old female with   Chief Complaint   Patient presents with    Right Foot - Pain     She reports continued pain when she is walking, the right foot/ankle more painful than the left. She has been using the arch supports which have helped some.    Right Ankle - Pain    Left Foot - Pain    Left Ankle - Pain      HPI: The patient returns for follow-up evaluation regarding her lateral foot/ankle condition, right worse than left, especially the right fifth metatarsal head.  The patient has found the arch supports to be helpful but she still feels like she has pain on the right foot.  She notices that her right hip turns out and she points to the area at the base of the right fifth toe.  The patient is wondering whether the inserts could be tilted to keep her from rolling out on the right.    Prior treatment has included: [place] rocker soled shoes for treatment of plantar fasciitis in 2019.      PAST MEDICAL HISTORY  Allergies:  Review of patient's allergies indicates:  Allergies   Allergen Reactions    Adhesives Skin: Itching    Hydroxychloroquine Dizziness    Other (See Comments)      Other reaction(s): Adverse Drug Reaction     Medications:  has a current medication list which includes the following prescription(s): amlodipine, celecoxib, chlorthalidone, diclofenac, estradiol, fluocinolone, losartan, nystatin-triamcinolone, omega-3, and trospium chloride er.    Past Medical History:   has a past medical history of Allergic rhinitis due to allergen, Arthritis, Cancer ([place]), Constipation, Diverticulosis, Glaucoma, H. pylori infection, Headache, History of diarrhea, History of irritable bowel syndrome, Hypertension, Lipidemia, Osteoporosis, and Primary insomnia.    Past Surgical History:   has a past surgical history that includes Bladder suspension; Basal cell carcinoma excision; pr colonoscopy stoma dx including collj spec spx; pr total  abdominal hysterect w/wo rmvl tube ovary (04/04/1996); and Breast biopsy (Left, 05/27/2015).    Family History:  family history includes Breast Cancer in her maternal aunt and sister; Cancer in her mother; Diabetes in her daughter; GI in her mother; Hypertension in her sister; Myocardial Infarction in her brother; No Known Problems in her father. There is no history of Migraine Headaches or Ovarian Cancer.    ROS:  ROS is positive for musculoskeletal as mentioned in the HPI and as stated in the past medical history.      PHYSICAL EXAM:  BP 139/65    Pulse (!) 56    Resp 16    SpO2 98%     GENERAL:  The patient is alert and oriented to person, place, and time.  No apparent distress.    VASCULAR:  Pedal pulses are palpable.  Feet are warm to touch with normal capillary refill.  No vasospasm present.    NEUROLOGICAL:  Sensation is intact to light touch, bilateral.  No hypersensitivity to light touch.      DERMATOLOGICAL:    Normal skin temperature turgor and texture.  No erythema or ecchymosis.      MUSCULOSKELETAL:   Tenderness noted on palpation of the right foot at multiple locations, especially the fifth metatarsal head.  Residual pain also at at the plantar fascial insertion of both feet, right worse than left.  Pain with inversion of the forefoot on the  rear foot, right.  Tenderness noted at first MTP joint, right worse than left.  Manual muscle testing of extrinsic and intrinsic muscles groups reveals normal strength.  Range of motion is supple and without pain at the ankle and subtalar joints.  Ankle joint range of motion is supple without evidence of crepitus.  Foot type: Flexible high arch foot type, stands pronated, bilateral  Gait evaluation: Residual limp noted during gait.    DIAGNOSTIC IMAGING:    XR Ankle 2 View Left    Result Date: 06/22/2020  EXAMINATION: XR FOOT 3 VW LEFT, XR ANKLE 2 VW LEFT CLINICAL INDICATION: foot and ankle pain COMPARISON: February 22, 2019 FINDINGS AND     Moderate first MTP  joint osteoarthritis. Focal lucency is suggested in the first metatarsal head and neck junction, possibly representing underlying cyst versus focal lytic lesion. Plantar calcaneal enthesophyte. Diffuse osteopenia.Intact ankle mortise. No soft tissue swelling about the ankle. No acute fracture.     XR Ankle 2 View Right    Result Date: 06/22/2020  EXAMINATION: XR ANKLE 2 VW RIGHT CLINICAL INDICATION: foot and ankle pain COMPARISON: No prior images are currently available for comparison. FINDINGS AND     There is no acute fracture or dislocation. There is no soft tissue swelling about the ankle. Ankle mortise is intact.    XR Foot 3 View Left    Result Date: 06/22/2020  EXAMINATION: XR FOOT 3 VW LEFT, XR ANKLE 2 VW LEFT CLINICAL INDICATION: foot and ankle pain COMPARISON: February 22, 2019 FINDINGS AND     Moderate first MTP joint osteoarthritis. Focal lucency is suggested in the first metatarsal head and neck junction, possibly representing underlying cyst versus focal lytic lesion. Plantar calcaneal enthesophyte. Diffuse osteopenia.Intact ankle mortise. No soft tissue swelling about the ankle. No acute fracture.     XR Foot 3 View Right    Result Date: 06/22/2020  EXAMINATION: XR FOOT 3 VW RIGHT CLINICAL INDICATION: foot and ankle pain COMPARISON: Ankle February 22, 2019 FINDINGS AND     Moderate first MTP joint osteoarthritis.No acute fracture or dislocation.     IMPRESSIONS:    (M77.41) Metatarsalgia of right foot  (primary encounter diagnosis)    PLAN:    Patient education took place regarding the treatment progress and further measures to address their condition. The patient was given an opportunity to ask questions.  The prior radiographs were reviewed with the patient and discussed and advanced degenerative changes are noted at the first metatarsophalangeal joint of both feet..    Note: Patient remains most symptomatic at her right fifth metatarsal head plantarly.  She had previously related that she feels like  her foot symptoms may be coming from her back issues.     Recommended treatment plan at this time includes the following:    --Recommend regular use of arch supports/orthotics in stable shoes, even in the home.     --Recommend insert modification with addition of valgus tilt on the right.  The patient related significant relief upon walking out of the office today.    --Patient encouraged to continue with the calf stretches on a regular basis.  She does Pilates regularly.    --Future considerations include: Follow-up evaluation in future as needed regarding right fifth metatarsal pain; consider injection therapy at right fifth MTP; consider injection therapy at first MTP joint, right worse than left; MRI if symptoms fail to improve as expected.    --Communication was emphasized to the patient to call the office immediately  if there is any worsening of their current status.    Patient Instructions   --Recommend regular use of arch supports/orthotics in stable shoes, even in the home.     --Recommend insert modification with onsite pedorthist including valgus tilt.    --Please call if your condition worsens or problems arise.     No orders of the defined types were placed in this encounter.    TIME SPENT:  I spent a total of 20 minutes on this visit today, including chart review prior to the visit, face-to-face time with the patient, and documentation/coordination of care after the visit on date of service.      Follow up plan:  Return if symptoms worsen or fail to improve.    Charm Barges, DPM, FACFAS  [facility]    Portions of this document were created with voice-recognition software and may contain occasional errant or misspelled words.

## 2020-10-01 ENCOUNTER — Encounter (HOSPITAL_BASED_OUTPATIENT_CLINIC_OR_DEPARTMENT_OTHER): Payer: Medicare PPO | Admitting: Rehabilitative and Restorative Service Providers"

## 2020-10-15 ENCOUNTER — Encounter (HOSPITAL_BASED_OUTPATIENT_CLINIC_OR_DEPARTMENT_OTHER): Payer: Medicare PPO | Admitting: Rehabilitative and Restorative Service Providers"

## 2020-10-30 ENCOUNTER — Telehealth (INDEPENDENT_AMBULATORY_CARE_PROVIDER_SITE_OTHER): Payer: Self-pay | Admitting: Otolaryngology

## 2020-10-30 NOTE — Telephone Encounter (Signed)
RETURN CALL: Voicemail - Detailed Message      SUBJECT:  Appointment Request     REASON FOR VISIT: Follow-up to discuss ear pain  PREFERRED DATE/TIME: ASAP  ADDITIONAL INFORMATION: Please contact patient to discuss and assist with scheduling.

## 2020-11-03 ENCOUNTER — Encounter (INDEPENDENT_AMBULATORY_CARE_PROVIDER_SITE_OTHER): Payer: Self-pay | Admitting: Otolaryngology

## 2020-11-03 ENCOUNTER — Other Ambulatory Visit (HOSPITAL_BASED_OUTPATIENT_CLINIC_OR_DEPARTMENT_OTHER): Payer: Self-pay | Admitting: Family Medicine

## 2020-11-03 DIAGNOSIS — Z1231 Encounter for screening mammogram for malignant neoplasm of breast: Secondary | ICD-10-CM

## 2020-11-11 NOTE — Telephone Encounter (Signed)
I returned patient's call.  She is transferred to the front desk to schedule an appointment for ear pain.  Bennie Hind, CMA

## 2020-11-26 ENCOUNTER — Encounter (INDEPENDENT_AMBULATORY_CARE_PROVIDER_SITE_OTHER): Payer: Self-pay | Admitting: Otolaryngology

## 2020-11-26 ENCOUNTER — Ambulatory Visit (INDEPENDENT_AMBULATORY_CARE_PROVIDER_SITE_OTHER): Payer: Medicare PPO | Admitting: Otolaryngology

## 2020-11-26 VITALS — BP 110/78 | HR 66

## 2020-11-26 DIAGNOSIS — H608X3 Other otitis externa, bilateral: Secondary | ICD-10-CM

## 2020-11-26 DIAGNOSIS — G43809 Other migraine, not intractable, without status migrainosus: Secondary | ICD-10-CM

## 2020-11-26 NOTE — Patient Instructions (Signed)
Thank you for choosing [place] at [facility] to be a part of your health care team!  We care about your experience here and want your journey to be as frictionless as possible.  Your feedback is very important to our practice. Should you receive a [facility] patient satisfaction survey, please let us know how we are doing.  [place] accepts 9-10 as OK and satisfied, 1-8 as no credit.  It is our goal to serve you with the highest quality care.                                          Thank you, Sarah & Dr. Augustin Coupe  :)        Olive oil or baby oil - 1-2 drops in the ear for itchy ears      Itchy Ears    Itchy ears are a very common problem. For some people the problem is so bad that they stick various objects into the ears, causing trauma to the ear canal. The most common causes of itching are a nervous habit, fungal infection or the beginning of an infection. Other causes can be skin diseases such as psoriasis or dermatitis. Some people with allergies complain of Itchy ears.    The ear canal may be normal on examination or there may be scaling of the skin. People aggravate the problem by using things such as bobby-pins, coat hangers and tooth picks to scratch the ear. This can produce abrasions of the ear canal.  Any break in the skin can allow bacteria to enter through this protective barrier. The ear will then become infected requiring it to be treated the same as swimmers ear.    Itching by itself without evidence of trauma or infection can be treated with a mild steroid ear drop.  A few drops placed in the ear will help to decrease the amount of itching. Another helpful treatment is the use of 70% alcohol (rubbing alcohol) as an ear drop. May add apple cider vinegar.  CAUTION: itchy ears may be the first signal that an infection is developing. If the ear is infected the alcohol will burn.      Association of Migraine Disorders      What Is Migraine?  According to diagnostic criteria established by the International  Headache Society, to be diagnosed with migraine disease, patients must have had at least 5 headache attacks that lasted 4-72 hours and the attacks must have had at least 2 of the following characteristics:    Unilateral location  Pulsating quality  Moderate or severe pain intensity  Aggravation by or causing avoidance of routine physical activity  In addition, during the headache the patient must have had at least 1 of the following:    Nausea and/or vomiting  Photophobia and phonophobia       We think it is imperative to view migraine as a full-body disorder.    While this is the typical classification for migraine disease, we at [facility] recognize that migraine is a complex neurological disease with symptoms well beyond a headache. Migraine can impact the entire nervous system. This means people may experience symptoms in various parts of their body. One may think these widespread symptoms are not related but in fact, migraine could be the underlying cause. Here are some common migraine symptoms that aren't often associated with migraine:    Imbalance  Difficulty finding words  Ear and sinus pressure  Sleeping poorly  Feeling anxious  We choose to highlight the spectrum of migraine manifestations.         Migraine Phases  Migraine attacks can progress through several phases, each with characteristic symptoms, varying in severity and duration. While this graph shows it as 4 distinct phases, the symptoms actually overlap significantly. Not all individuals experience all 4 phases and each persons attack can vary from one to the next.    A unique aspect of a migraine attack is the prodrome and postdrome phases. While a person may not be having a headache in this phase, that may not be feeling or functioning well for hours or days.    Many migraine patients describe their prodrome as the onset of yawning or the feelings of depression, slow thinking or fatigue.    A fifth phase that occurs between cycles is called the  interictal phase. It can be an unpredictable length of time and therefore some people struggle with the anxiety of not knowing when to expect their next attack.    Migraine With Aura  Two main types of migraine have been recognized: migraine with and without aura.  An aura is a temporary sensory disturbance before or during the headache. Common auras are the illusion of changes in vision, an unpleasant phantom smell or confused thinking.   While patients often describe an aura where they feel a numbness that starts in an extremity and moves centrally, visual auras are FAR the most common. The classic visual aura is a scintillating scotoma  - that is, a small central area of temporary blindness followed by brightly colored, shimmering light.  It is helpful to remember that an aura does not have to occur with every headache and about 5% of those with aura never have a headache. The most common of these painless migraine events is an ophthalmic, or ocular, migraine. People often mistake this for a transient ischemic attack or stroke.    Episodic vs. Chronic Migraine  Episodic Migraine is characterized by those with migraine who have 0 to 14 headache days per month, while Chronic Migraine is characterized by 15 or more headache days per month.    A detailed look at the current understanding of migraine pathophysiology, including an explanation of new theories in the impact of sex hormones, why migraine mimics sinusitis,  the evolution of migraine into a chronic disease and more.    Established Migraine Types  Vestibular Migraine  This condition may soon be recognized as the most common cause of dizziness. It is distinctive for its wide range of symptoms (imbalance, spinning, ear pressure, and tinnitus), variable duration (seconds to days) and other non-headache symptoms, such as difficulty seeing, intolerance for bright lights and noises, neck pain and spasms, confusion, spatial disorientation and increased anxiety.  It mimics the two other common balance disorders, benign positional vertigo and Menieres disease. The most recent version of the International Classification of Headache Disorders recognizes vestibular migraine as a subtype of migraine.    Ocular Migraine  An ocular migraine is an episode, usually in one eye, where any of a number of visual distortions can occur for up to about one hour. Thought to be caused mainly by tightening or swelling of the blood vessels in the optic nerve at the back of the eye, there may be other factors at play as well. Often an attack begins with a blind spot in the central area of vision which  can start small and get larger. This is often followed by an aura stage with other visual distortions such as what looks like a spot of light moving about in your peripheral and central vision, or a kaleidoscope view. An ocular migraine attack can occur with or without an accompanying headache.    Hemiplegic Migraine  Hemiplegic migraine (HM) is a rare type of migraine where an individual experiences motor weakness on one side of the body, usually accompanied by or followed by a headache.  The weakness is a form of aura and is associated with at least one other aura symptom such as visual changes. The weakness is not always on the same side as the head pain and it can vary from mild to severe. HM may affect only part of the body, such as just the hand or both the hand and arm, or just the face. Weakness of the entire side of the body can occur. Severe attacks of HM can occur that may progress to seizure, decreased consciousness, or coma. Most individuals who experience HM also experience migraine attacks with typical aura (without weakness).    Abnormal variations in three genes have been identified as causes of familial hemiplegic migraine. Variations in the CACNA1A gene cause familial HM type 1. Variations in the ATP1A2 gene cause familial HM type 2. Variations in the SCN1A gene cause familial  HM type 3.    Migraine With Brainstem Aura  Migraine with brainstem aura is a classification that used to be known as basilar migraine. There is some controversy surrounding this diagnosis and publications vary in regard to its prevalence and its cause/origin.    This type of migraine occurs when aura symptoms are of the type thought to originate from the brainstem, but there is no motor weakness associated with the aura. According to ICHD-3, the diagnostic criteria for [facility] is migraine with aura including at least two of the following symptoms: slurred or slow speech (dysarthria), vertigo, ringing in the ears (tinnitus), partial hearing loss (hypoacusis), double vision (diplopia), impaired coordination (ataxia), or decreased level of consciousness.    Migraine Without Head Pain (Silent Migraine)  Those with migraine without head pain experience phases of a migraine, including the aura phase, but do not experience head pain. People with migraine without head pain may experience nausea, visual disturbances, runny nose, dizziness, weakness, brain fog, and others.    Abdominal Migraine  Abdominal migraine is characterized by recurrent episodes of moderate to severe belly pain with nausea, with or without vomiting, in children. Often there is no associated headache. It lasts from 2 hours to 3 days, without symptoms between episodes.    Status Migrainosus  Status Migrainosus, also known as intractable migraine, is a migraine that lasts longer than 72 hours. The symptoms are more severe than a typical migraine and abortive medications are usually ineffective. This condition is very difficult to treat and inpatient hospitalization may be necessary for pain management and for the prevention of dehydration due to nausea/vomiting. Status Migrainosus generally occurs due to ineffective treatment, an adaptation to medication or when abortive medications are taken too late in the migraine cycle.    SINUS MIGRAINE  A study found  that greater than 80% of patients presenting with sinusitis met the criteria for migraine disease. It is hypothesized that the activation of the trigeminal-autonomic reflex causes sinus-like symptoms: mid-facial pain, stuffy nose, nasal drainage and loss of smell. The combination of sinus-like symptoms along with the ICHD-3 diagnostic criteria of migraine indicates  a likely diagnosis of sinus migraine. It is important that all healthcare providers screen for migraine prior to diagnosing a patient with sinusitis. Sinus pathologies are usually ruled out prior to diagnosis of sinus migraine. The misdiagnosis of these similar conditions can lead to antibiotic misuse as well as unnecessary surgical intervention.

## 2020-11-26 NOTE — Progress Notes (Signed)
11/26/2020    Office visit         PATIENT:  Robin Hart  DOB:  [birth year]  MRN:  [id number]      CHIEF COMPLAINT/HPI:  Patient is a 41 year oldfemalewho presented with migraine headache for 3 months, onset 5/22.  Symptoms are moderate, continuous, and unchanged. She saw Dr. Verdene Lennert for migraine associated dizziness.  Her [facility] neurologist also diagnosed migraine associated dizziness.  She had vestibular therapy in [place].  Associated symptoms include imbalance and tinnitus.  She denies otalgia, otorrhea, and hearing loss.      Patient also reported itchy ears for many years.  Symptoms are moderate, intermittent, and unchanged.  She uses Q-tips.  Symptoms improve temporarily with Mycolog cream.     09/2019 pulsatile tinnitus  LV 09/2019 ear itching, tinnitus.    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  MRA head/neck 02/27/17:  WNL         PHYSICAL EXAMINATION:  General: Well developed, appearing stated age and in no acute distress.  Participated in the patient interview appropriately.  Eyes: Clear conjunctivae, normal lids. Pupils equal, round and reactive to light.  Extraocular movements intact.  Ears: visualized by otoscopy.    Right: Pinna unremarkable.  External ear canal without masses or skin abnormality. Free of cerumen. Tympanic membrane intact and without retraction or perforation. Middle ear without fluid.      Left: Pinna unremarkable.  External ear canal  without masses or skin abnormality. Free of cerumen. Tympanic membrane intact and without retraction or perforation. Middle ear without fluid.         IMPRESSION/PLAN:   1.  Vestibular migraine  Refer to vestibular therapy-[facility]  Migraine handout provided  Follow-up with neurology for migraine management    2.  Chronic otitis externa  Trial of baby oil or olive oil for itchy ears        Kirke Shaggy, MD      Cc:  Rachel Moulds Mathews Robinsons, MD  Renelda Loma, MD  [address] [place] [address]

## 2020-12-14 ENCOUNTER — Ambulatory Visit
Admission: RE | Admit: 2020-12-14 | Discharge: 2020-12-14 | Disposition: A | Discharge status: Home / Self Care | Payer: Medicare PPO | Attending: Diagnostic Radiology | Admitting: Diagnostic Radiology

## 2020-12-14 ENCOUNTER — Encounter (HOSPITAL_BASED_OUTPATIENT_CLINIC_OR_DEPARTMENT_OTHER): Payer: Self-pay

## 2020-12-14 DIAGNOSIS — Z1231 Encounter for screening mammogram for malignant neoplasm of breast: Secondary | ICD-10-CM | POA: Insufficient documentation

## 2020-12-22 ENCOUNTER — Ambulatory Visit: Payer: Medicare PPO | Attending: Physician Assistant | Admitting: Physician Assistant

## 2020-12-22 VITALS — Ht 60.0 in | Wt 118.0 lb

## 2020-12-22 DIAGNOSIS — G43009 Migraine without aura, not intractable, without status migrainosus: Secondary | ICD-10-CM | POA: Insufficient documentation

## 2020-12-22 DIAGNOSIS — R519 Headache, unspecified: Secondary | ICD-10-CM | POA: Insufficient documentation

## 2020-12-22 DIAGNOSIS — G43109 Migraine with aura, not intractable, without status migrainosus: Secondary | ICD-10-CM | POA: Insufficient documentation

## 2020-12-22 NOTE — Progress Notes (Signed)
[facility] HEADACHE [facility]INIC using Telemedicine    Distant Site Telemedicine Encounter      I conducted this encounter via secure, live, face-to-face video conference with the patient. I reviewed the risks and benefits of telemedicine as pertinent to this visit and the patient agreed to proceed.    Provider Location: On-site location (clinic, hospital, on-site office)  Patient Location: At home  Present with patient: No one else present        Headache Clinic Visits to date  - 01/18/2018 - Initial visit (by Dr. Lambert Keto)  - 12/22/2020 - Follow up visit #1 using Telemedicine (by Jinny Sanders, PA-C) Today    Today I see the patient for the first time.     IDENTIFICATION DATA/CHIEF COMPLAINT:  Robin Hart is a 80 year old female who returns today to discuss her headaches.      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 Jerilynn Som at [place], [place] for headache and dizziness    ========================================    CURRENT MEDICATION LIST PER TODAY 12/22/2020:    ACUTE MEDICATIONS  Currently taking:   - Aleve - 2 days a month in Sept 2022 (LV: 2 days per month for arthtitis)  - Excedrin Migraine - 2 days a month in Sept 2022   - Tylenol - 2 days a month in Sept 2022 (did not help)     [place]  Currently taking:  - Verapamil 40 mg daily - started one week ago in Sept 2022, she was recommended to increase the dose to 80   - Losartan for high blood pressure     Tried in the past:  - Amlodipine, replaced by verapamil     THERAPIES TRIED  - None      SUPPLEMENTS:  Currently taking:  - Magnesium 500 mg  - Fish oil   - B2    Tried in the past:  -  None       HISTORY OF PRESENT ILLNESS  She reports the following features of her headaches:     - FREQUENCY (HA days/month): almost every day headaches and dizziness in Sept 2022, ut sometimes she feels only dizziness or only headache.   - AVERAGE INTENSITY: 3-5/10 usually, but it could go up to 10/10  - DURATION: comes and goes      She had 0 migraine days a month since Sep 01 2020. She has almost constant nausea, but no vomiting.     Tysheka is here today for a follow up visit, medications review and refills.   She reports that she usually wakes up with a HA, then it comes and goes during the day. Sleep helps.   She prefers to take Excedrin Migraine and Coke, usually it helps.   She was recommended by her PCP to take Tylenol and Aleve x2 along with Coke (her cocktail). For prevention magnesium 500 mg, riboflavin and CoQ10 and butterbur.  She has HTN controlled now with Verapamil 40 mg daily and Losartan and her PB is now around 135/60.   She also started acupuncture and it seems to help.   Sleep is another concern for her.  She is also seeing Dr. Jerilynn Som at her local neurology clinic in [place], last visit on 10/30/2020:  "Discussed a number of options, settled on acupuncture, for which there is evidence of efficacy for migraines. If her dizziness is related, that is if we suppose she has vestibular migraines (very possible) then this might help the dizziness. Also discussed verapamil and CGRP agents, and side effects, patient declined. Also discussed steroids or toradol, patient again declined. Warned about rebound."      Extracted from the last visit notes in 2019 by Dr. Lambert Keto:  Jenessa was last in her usual state of health on October 15th, 2018. That day, she was in a physical therapy appointment working with a therapy ball. She was bouncing on the ball and developed acute onset vertigo, described as a sense of motion about her. She subsequently developed vertigo, which could last hours at a  time, triggered by head movement. The vertigo was associated with nausea. She describes a more mild baseline vertigo, as well; about 50% of the time, she has a mild-moderate dull headache located at bifrontally, rated 4/10 in severity, present through much of the day. The headache sometimes has a low intensity throbbing pain associated with it. If she sleeps for 30 minutes or drinks a Coke, the headache may be alleviated.     Over the following months, she did not treat the vertigo, and it improved on its own with time. In the Spring of 2019, the vertigo reached a plateau, and now she only feels a sense of motion around her intermittently when she turns her head quickly.     She denies other neurologic symptoms, including double vision, blurry vision (apart from her baseline), difficulty with speech or swallow, loss of consciousness, tingling, and weakness. She denies hearing loss, tinnitus, and hyperacusis.     She notes that since the onset of her symptoms, her blood pressure, which previously had been borderline elevated, became significantly elevated, requiring multiple antihypertensives.     She was seen by ENT, where she was found to have a normal audiogram, videonystagmography, and auditory brainstem response. 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 She underwent tilt table testing, which showed evidence of orthostatic hypotension, but may have been affected by some of Shantrell's antihypertensives (losartan, amlodipine, and chlorthalidone).    She has tried meclizine (did not help) and Aleve (did not help). In general, she prefers not to take medications.    Virdell states she has had a longstanding history of motion sickness. Even during young adulthood when her children in the backseat, she had to turn her head very  slowly in order to see what her children were doing, to avoid severe dizziness. She additionally has a history of migraine headaches, for which she saw a neurologist in [place]. She states he recommended she start preventative therapy, but she declined. She describes her migraines as first occurring in 2008, and involving bifrontal, throbbing pain rated 10/10 in severity, preventing her from performing activities, lasting hours at a time. They are associated with nausea; no light or sound sensitivity. About half the time, she experiences a visual aura prior to onset of the headache. She does not take over the counter medications for migraines. She has 5-6 migraines per year.     Current headache days per month: 12  Current severe  headache days per month: 0    AS PER [employer] SURVEY, COMPLETED BY PATIENT FOR VISIT:  First headache(s) occured when Merrick was 80 years old.  Headaches started to become a problem about 7 months ago. Cashlyn is having an average of 3 days of headache per month, with around 0 days of severe headache per month. The average pain severity of these headaches is 4 out of 10 with the most severe headaches rated as 8 out of 10. The headaches will often last 5 hours, occurring no specific time. Ellysa believes that the headache(s) is caused by "rotation of my head." Nattie has the following description of their headache: "I have vertigo that may or may not be preceded by vision distortion and followed by nausea and a headache."    Kimyata has seen the following previous providers prior to this appointment: Dr. [name]    HEADACHE LOCATION: Forehead   LATERALITY OF HEADACHE: Bilateral   QUALITY OF HEADACHE PAIN: Dizzy     HEADACHE TRIGGERS.  Rotating my head     VISUAL CHANGE(S) BEFORE OR DURING HEADACHE Seeing zigzag lines   Temporary blind spot (scotoma)   Blurred vision      ASSOCIATED HEADACHE SYMPTOMS.  Dizziness   Nausea     AUTONOMIC SYMPTOMS No autonomic symptoms  during headache     EXACERBATION OF HEADACHES.  Physical exertion    HEADACHE RELIEF.  Sleep (at night or nap)  Rest    Sherrin reports they have never sought care in the ER for headache.     ========================================    Physical Exam:  Ht 5' (1.524 m)    Wt 53.5 kg (118 lb) Comment: Patient reported   BMI 23.05 kg/m    General: No acute distress, well-nourished, well-groomed, bright affect  Head: Normocephalic, atraumatic, normal hair distribution  Eyes: Sclera white, anicteric, conjunctiva clear  Neuro: oriented x3, follows conversation with ease    ========================================    ASSESSMENT AND PLAN:  Divya Munshi is a very pleasant 80 year old with diagnosis of (G43.109) Migraine with vertigo  (primary encounter diagnosis)  (G43.109) Migraine with aura and without status migrainosus, not intractable  (G43.009) Migraine without aura and without status migrainosus, not intractable  (R51.9) Chronic daily headache    Recommended last visit:  1. Acute (abortive) medications:   - Limit  - Boswellia  2. Preventive medications:   - None  3. Supplements       Recommended today on 12/22/2020:    1. Acute (abortive) treatment:   The Headache Clinic recommends the following non-medication treatment for an acute episode of migraine:   - Two capsules of Boswellia Serrata 800 mg at once, you could take another capsule in the evening.  - Magnesium citrate, magnesium glycinate or chelated magnesium [phone] mg tablets or powder (Calm)  - Ice packs or a Migraine Hat   - Massage on the head, neck and shoulders - you may consider massage devices such as Truemedic  - [name] neuromodulation for acute treatment (60 minutes as needed). You can also do prevention 20 minutes in the evening before going to bed.  Valene Bors medical device - 45 minutes acute treatment  - Hydration  - Rest in a dark and quiet room  - Mild to moderate exercise  - Biofeedback, meditation, mindfulness for stress  reduction  - Spicy foods (capsaicin)    - OK to tale occasionaly Aleve and Tylenol for acute pain.     2. Preventive treatment:  -  I agree to continue taking verapamil 40 mg. This dose could be increased to 80 mg, managed by your local neurologist.   - Continue Losartan for blood pressure control     3. Natural approaches:   Continue taking recommended supplements to support the brain function:   SAMe 400 mg a day, could prevent migraine and improve dizziness as well    Vitamin B2 400 mg daily   Vitamin B12 1000 mcg daily   Light therapy - getting early morning light to increase natural serotonin and melatonin. Could also consider light therapy box (happy lamp) - 10,000 LUX.   Boswellia 800 mg 2-3 times per day, example brand Swanson's, natural anti inflammatory herb   Melatonin at night 5 to 10 mg to take 30 minutes prior to sleep for better sleep modulation    Feverfew Tea 1-3 cups per day. Can get from buddhateas.com or tenzingmomo.com- A Natural anti inflammatory approach that does not cause further sensitization of the system.   Magnesium citrate or magnesium glycinate or chelated magnesium 250 mg daily, slowly increase up to [phone] mg daily. Side effect may include diarrhea. Magnesium can help preventing headache, preventing migraine aura, calming nerves, muscles and even mood.    CoQ10 100-200 mg for more brain energy.   Ginger 400 mg daily.   Vitamin D3 1000-5000 IU.     4. Could try neuromodulation using medical devices ([name], Nerivio, GammaCore).    5. We recommend exercise, nutrition, and sleep recommendations for lifestyle management of headaches.    6. Follow up with Dorrene German PA-C in 3 months or sooner if any new problems related to headache management arise.    Benefits, side effects, and alternatives were discussed.   The patient understands all the recommendations.     The patient is agreeable with the plan.     I spent 45 minutes in total with the patient including face to face  time using Telemedicine, reviewing chart notes, counseling regarding the diagnosis and treatment options and documenting the visit.       Jinny Sanders, PA-C   [facility]

## 2020-12-22 NOTE — Patient Instructions (Signed)
ASSESSMENT AND PLAN:  Robin Hart is a very pleasant 80 year old with diagnosis of (G43.109) Migraine with vertigo  (primary encounter diagnosis)  (G43.109) Migraine with aura and without status migrainosus, not intractable  (G43.009) Migraine without aura and without status migrainosus, not intractable  (R51.9) Chronic daily headache    Recommended last visit:  1. Acute (abortive) medications:   - Limit  - Boswellia  2. Preventive medications:   - None  3. Supplements       Recommended today on 12/22/2020:    1. Acute (abortive) treatment:   The Headache Clinic recommends the following non-medication treatment for an acute episode of migraine:   - Two capsules of Boswellia Serrata 800 mg at once, you could take another capsule in the evening.  - Magnesium citrate, magnesium glycinate or chelated magnesium [phone] mg tablets or powder (Calm)  - Ice packs or a Migraine Hat   - Massage on the head, neck and shoulders - you may consider massage devices such as Truemedic  - [name] neuromodulation for acute treatment (60 minutes as needed). You can also do prevention 20 minutes in the evening before going to bed.  Valene Bors medical device - 45 minutes acute treatment  - Hydration  - Rest in a dark and quiet room  - Mild to moderate exercise  - Biofeedback, meditation, mindfulness for stress reduction  - Spicy foods (capsaicin)    - OK to tale occasionaly Aleve and Tylenol for acute pain.     2. Preventive treatment:  - I agree to continue taking verapamil 40 mg. This dose could be increased to 80 mg, managed by your local neurologist.   - Continue Losartan for blood pressure control     3. Natural approaches:   Continue taking recommended supplements to support the brain function:  SAMe 400 mg a day, could prevent migraine and improve dizziness as well   Vitamin B2 400 mg daily  Vitamin B12 1000 mcg daily  Light therapy - getting early morning light to increase natural serotonin and melatonin. Could also  consider light therapy box (happy lamp) - 10,000 LUX.  Boswellia 800 mg 2-3 times per day, example brand Swanson's, natural anti inflammatory herb  Melatonin at night 5 to 10 mg to take 30 minutes prior to sleep for better sleep modulation   Feverfew Tea 1-3 cups per day. Can get from buddhateas.com or tenzingmomo.com- A Natural anti inflammatory approach that does not cause further sensitization of the system.  Magnesium citrate or magnesium glycinate or chelated magnesium 250 mg daily, slowly increase up to [phone] mg daily. Side effect may include diarrhea. Magnesium can help preventing headache, preventing migraine aura, calming nerves, muscles and even mood.   CoQ10 100-200 mg for more brain energy.  Ginger 400 mg daily.  Vitamin D3 1000-5000 IU.     4. Could try neuromodulation using medical devices ([name], Nerivio, GammaCore).    5. We recommend exercise, nutrition, and sleep recommendations for lifestyle management of headaches.    6. Follow up with Robin German PA-C in 3 months or sooner if any new problems related to headache management arise.    Benefits, side effects, and alternatives were discussed.   The patient understands all the recommendations.     The patient is agreeable with the plan.

## 2020-12-28 ENCOUNTER — Encounter (HOSPITAL_BASED_OUTPATIENT_CLINIC_OR_DEPARTMENT_OTHER): Payer: Self-pay | Admitting: Physician Assistant

## 2020-12-28 NOTE — Telephone Encounter (Signed)
MyChart message sent to patient with response from Lambert Keto MD:    "  Hi Robin Hart,   This is a comprehensive list and not all need to be started at once. She may want to start with Vitamin B2 400mg  daily, then add magnesium after a few days, and then stay on that for a month or so. Given her brain imaging with hemosiderin deposition, I would not add boswellia (as this has an anti-platelet property). Additional supplements can be added in follow-up, if needed.     Many thanks,   [place]    Message text       "

## 2020-12-29 ENCOUNTER — Encounter (HOSPITAL_BASED_OUTPATIENT_CLINIC_OR_DEPARTMENT_OTHER): Payer: Medicare PPO | Admitting: Rehabilitative and Restorative Service Providers"

## 2021-01-06 ENCOUNTER — Encounter (HOSPITAL_BASED_OUTPATIENT_CLINIC_OR_DEPARTMENT_OTHER): Payer: Self-pay

## 2021-01-06 ENCOUNTER — Ambulatory Visit: Payer: Medicare PPO | Attending: Internal Medicine | Admitting: Rehabilitative and Restorative Service Providers"

## 2021-01-06 DIAGNOSIS — R262 Difficulty in walking, not elsewhere classified: Secondary | ICD-10-CM | POA: Insufficient documentation

## 2021-01-06 DIAGNOSIS — M545 Low back pain, unspecified: Secondary | ICD-10-CM | POA: Insufficient documentation

## 2021-01-06 DIAGNOSIS — G8929 Other chronic pain: Secondary | ICD-10-CM | POA: Insufficient documentation

## 2021-01-06 DIAGNOSIS — M6289 Other specified disorders of muscle: Secondary | ICD-10-CM | POA: Insufficient documentation

## 2021-01-06 DIAGNOSIS — N3941 Urge incontinence: Secondary | ICD-10-CM | POA: Insufficient documentation

## 2021-01-06 DIAGNOSIS — M7061 Trochanteric bursitis, right hip: Secondary | ICD-10-CM | POA: Insufficient documentation

## 2021-01-06 DIAGNOSIS — R42 Dizziness and giddiness: Secondary | ICD-10-CM | POA: Insufficient documentation

## 2021-01-06 DIAGNOSIS — K5904 Chronic idiopathic constipation: Secondary | ICD-10-CM | POA: Insufficient documentation

## 2021-01-06 DIAGNOSIS — G43809 Other migraine, not intractable, without status migrainosus: Secondary | ICD-10-CM | POA: Insufficient documentation

## 2021-01-06 DIAGNOSIS — M7062 Trochanteric bursitis, left hip: Secondary | ICD-10-CM | POA: Insufficient documentation

## 2021-01-06 NOTE — Progress Notes (Signed)
[facility] PT:  VESTIBULAR PHYSICAL THERAPY INITIAL EVALUATION    This note serves as a CMS evaluation form that requires the referring provider to certify the need for therapy services furnished under this Plan of Treatment. The signing provider is certifying the plan of care.    GENERAL VISIT INFO    Encounter Diagnosis   Name Primary?    Dizziness Yes     Referring Provider: Marjory Lies, *  Referring Provider NPI: [id number]    VISIT COUNT   1    EVALUATION    Past Medical History:   Diagnosis Date    Allergic rhinitis due to allergen     Arthritis     Cancer ([place])     skin cell carcinoma     Constipation     Diverticulosis     Glaucoma     H. pylori infection     Headache     History of diarrhea     History of irritable bowel syndrome     Hypertension     Lipidemia     Osteoporosis     Primary insomnia        Past Surgical History:   Procedure Laterality Date    Basal cell carcinoma excision      BLADDER SUSPENSION      BREAST BIOPSY Left 05/27/2015    PR COLONOSCOPY STOMA DX INCLUDING COLLJ SPEC SPX      PR TOTAL ABDOMINAL HYSTERECT W/WO RMVL TUBE OVARY  04/04/1996       Social History     Social History Narrative    Not on file       SUBJECTIVE:  Patients Statement:  Robin Hart reports that she has had dizziness for a long time. She has noticed that her dizziness has worsened recently. She has been diagnosed with migraine and vestibular migraine. She reports having vestibular rehabilitation before which was not helpful.    She notes that she does not move her head at all as she avoids any thing that triggers dizziness. There are days where she has a moderate level of dizziness, described as internal spinning, where she can be functional. There are other days where she has severe dizziness where she cannot pick up her head.    Vertigo:  Reports internal spinning  Disequilibrium: constant worse with flare ups    Headache Hx: hx of migraines  Head Trauma hx: denies  Fall Hx:  denies  Ear Hx: reports increase in episodes of fullness in he ears  Vision Hx: hx of cataract surgery, hx of glaucoma    Charts Reviewed: Dr. Augustin Coupe 11/26/20, Caroll Rancher PA-C 12/22/20    OBJECTIVE:    CERVICAL SCREEN    Cervical ROM: limited ext/flexion Quadrant Testing: pos b/l   Vertebral Artery Testing: neg b/l Spurlings: pos b/l   Cervicogenic Dizziness (Rotation Smooth Pursuit): nt Cervical Spine Joint Repositioning Error test: nt   Upper Cervical Spine Palpation: nt UE Neurodynamic Testing: nt     NEURO SCREEN/COORDINATION     Cranial Nerves: intact Dysdiadokinesia: absent   UE Finger to nose (FTN) [EO/EC]: normal LE Heel to shin (HTS) [EO/EC]: normal   Hoffman's Test: nt Clonus: nt   Dermatomes UE: nt Dermatomes LE: nt     OCULOMOTOR EXAM    Ocular ROM all planes:  wnl Convergence:  Normal 2cm   Smooth Pursuit:    normal Saccadic Eye movements: normal   Cover/Uncover: nt Dynamic Cover/Uncover: nt     VESTIBULAR EXAM  Head Impulse Test: neg b/l  VOR Cancellation: neg    Dynamic Visual Acuity: 4 line difference abnormal    POSITIONAL VERTIGO: nt due to subjective history    BALANCE TESTS:    Modified CtSB Eyes Open Eyes Closed   Romberg:   60 60   Foam Romberg:  60 60       ASSESSMENT: Pt presents with signs and symptoms suggesting the following: 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    Dizziness Handicap Inventory: 40/100    TODAY'S TREATMENT:  Pt was educated on etiology of vestibular system and vestibular disorders and how it may pertain to current diagnosis.    Gaze stabilization VORx1 horizontal were prescribed. In clinic we performed VORx1  3 times. Pt reports moderate symptom provocation that took 120  seconds to return to baseline. HEP established: VORx1 horizontal for 3x30 seconds with 60 seconds break; 1-2/day 7/week     Next session will include more cervical spine rehabilitation.      PLAN: 1x/2-3 weeks. 10 weeks. Continue with vestibular rehabilitation program at home to progress adaptation.      SHORT TERM GOALS (2-4 weeks):  Independent with HEP.  Pt reports mild subjective improvement in dizziness and/or balance during daily activities.    LONG TERM GOALS ( 04/06/21 ):  Improve DHI from 40/100 to <25/100.  Pt reports moderate subjective improvement in dizziness and/or balance during daily activities.    TREATMENT AND PLAN    Long-term functional goals: Patient able to  return to daily activities requiring head turns with reduced dizziness.   by 04/06/21    These goals were discussed and the patient agrees with them.      Robin Hart J

## 2021-01-07 ENCOUNTER — Encounter (HOSPITAL_BASED_OUTPATIENT_CLINIC_OR_DEPARTMENT_OTHER): Payer: Self-pay | Admitting: Physician Assistant

## 2021-01-20 ENCOUNTER — Ambulatory Visit (HOSPITAL_BASED_OUTPATIENT_CLINIC_OR_DEPARTMENT_OTHER): Payer: Medicare PPO | Admitting: Rehabilitative and Restorative Service Providers"

## 2021-01-20 ENCOUNTER — Encounter (HOSPITAL_BASED_OUTPATIENT_CLINIC_OR_DEPARTMENT_OTHER): Payer: Self-pay | Admitting: Rehabilitative and Restorative Service Providers"

## 2021-01-20 DIAGNOSIS — R42 Dizziness and giddiness: Secondary | ICD-10-CM

## 2021-01-20 NOTE — Progress Notes (Signed)
[facility] PT: VESTIBULAR PHYSICAL THERAPY DAILY NOTE    GENERAL VISIT INFO    Encounter Diagnosis   Name Primary?    Dizziness Yes     Referring Provider: Marjory Lies, *  Referring Provider NPI: [id number]    VISIT COUNT   2    EVALUATION      Past Medical History:   Diagnosis Date    Allergic rhinitis due to allergen     Arthritis     Cancer ([place])     skin cell carcinoma     Constipation     Diverticulosis     Glaucoma     H. pylori infection     Headache     History of diarrhea     History of irritable bowel syndrome     Hypertension     Lipidemia     Osteoporosis     Primary insomnia        Past Surgical History:   Procedure Laterality Date    Basal cell carcinoma excision      BLADDER SUSPENSION      BREAST BIOPSY Left 05/27/2015    PR COLONOSCOPY STOMA DX INCLUDING COLLJ SPEC SPX      PR TOTAL ABDOMINAL HYSTERECT W/WO RMVL TUBE OVARY  04/04/1996       Social History     Social History Narrative    Not on file       SUBJECTIVE:  HPI as of 01/06/21:  [name] reports that she has had dizziness for a long time. She has noticed that her dizziness has worsened recently. She has been diagnosed with migraine and vestibular migraine. She reports having vestibular rehabilitation before which was not helpful.    She notes that she does not move her head at all as she avoids any thing that triggers dizziness. There are days where she has a moderate level of dizziness, described as internal spinning, where she can be functional. There are other days where she has severe dizziness where she cannot pick up her head.    Vertigo:  Reports internal spinning  Disequilibrium: constant worse with flare ups    Patients Statement:  Robin Hart reports that she has been compliant with her HEP and finds that it seems to be correlated to reduced headache episodes.    OBJECTIVE:  CERVICAL SCREEN    Cervical ROM: limited ext/flexion Quadrant Testing: pos b/l   Vertebral Artery Testing: neg b/l Spurlings:  pos b/l   Cervicogenic Dizziness (Rotation Smooth Pursuit): nt Cervical Spine Joint Repositioning Error test: nt   Upper Cervical Spine Palpation: nt UE Neurodynamic Testing: nt     NEURO SCREEN/COORDINATION                Cranial Nerves: intact Dysdiadokinesia: absent   UE Finger to nose (FTN) [EO/EC]: normal LE Heel to shin (HTS) [EO/EC]: normal   Hoffman's Test: nt Clonus: nt   Dermatomes UE: nt Dermatomes LE: nt     OCULOMOTOR EXAM    Ocular ROM all planes:  wnl Convergence:  Normal 2cm   Smooth Pursuit:    normal Saccadic Eye movements: normal   Cover/Uncover: nt Dynamic Cover/Uncover: nt     VESTIBULAR EXAM  Head Impulse Test: neg b/l  VOR Cancellation: neg    Dynamic Visual Acuity: 4 line difference abnormal    POSITIONAL VERTIGO: nt due to subjective history    BALANCE TESTS:    Modified CtSB Eyes Open Eyes Closed   Romberg:  60 60   Foam Romberg:             60 60         TREATMENT AND ASSESSMENT:  Patient Response:   Gaze stabilization VORx1 horizontal were reviewed and progressed to standing. In clinic we performed VORx1  3 times. Pt reports moderate symptom provocation that took 120 seconds to return to baseline. HEP established: VORx1 horizontal for 3x30 seconds with 60 seconds break; 1-2/day 7/week; she was instructed to use a metronome between 180-210    She was provided repeated end range retractions and upper thoracic spine extensions leading to significant reduction in neck pain.    We discussed how management of stimulation would be key to managing her migraines. With this being said we discussed how vision may be a contributor to her migraine provocation. She reads off an ipad and was recommended to find an ebook reader that is less harsh on her eyes.      PLAN:   1x/2-3 weeks. 10 weeks. Continue with vestibular rehabilitation program at home to progress adaptation.      Robin Hart J

## 2021-03-05 ENCOUNTER — Encounter (HOSPITAL_COMMUNITY): Payer: Self-pay

## 2021-03-12 ENCOUNTER — Encounter (HOSPITAL_BASED_OUTPATIENT_CLINIC_OR_DEPARTMENT_OTHER): Payer: Medicare PPO | Admitting: Rehabilitative and Restorative Service Providers"

## 2021-03-22 NOTE — Progress Notes (Signed)
[facility]AC[facility]IC using Telemedicine    Distant Site Telemedicine Encounter      I conducted this encounter via secure, live, face-to-face video conference with the patient. I reviewed the risks and benefits of telemedicine as pertinent to this visit and the patient agreed to proceed.    Provider Location: On-site location (clinic, hospital, on-site office)  Patient Location: At home  Present with patient: No one else present        Headache Clinic Visits to date  - 01/18/2018 - Initial visit (by Dr. Lambert Keto)  - 12/22/2020 - Follow up visit #1 using Telemedicine (by Jinny Sanders, PA-C)   - 03/23/2021 - Follow up visit #2 using Telemedicine (by Jinny Sanders, PA-C) Today    IDENTIFICATION DATA/CHIEF COMPLAINT:  Robin Hart is a 80 year old female who returns today to discuss her headaches.      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 Jerilynn Som at [place], [place] for headache and dizziness    ========================================    CURRENT MEDICATION LIST PER TODAY 03/23/2021:    ACUTE MEDICATIONS  Currently taking:   - Aleve - 2 days a month in Sept 2022 (LV: 2 days per month for arthtitis)  - Excedrin Migraine - 2 days a month in Sept 2022   - Tylenol - 2 days a month in Sept 2022 (did not help)     Bland  Currently taking:  - Verapamil 40 mg daily - started one week ago in Sept 2022, she was recommended to increase the dose to 80   - Losartan for high blood pressure     Tried in the past:  - Amlodipine, replaced by verapamil     THERAPIES TRIED  - None     SUPPLEMENTS:  Currently taking:  - Magnesium 500 mg  -  Fish oil   - B2    Tried in the past:  - None       HISTORY OF PRESENT ILLNESS  She reports the following features of her headaches:     - FREQUENCY (HA days/month): 15 days in a year when she has headaches, usually clustered in few episodes (LV: almost every day headaches and dizziness in Sept 2022, but sometimes she feels only dizziness or only headache)  - AVERAGE INTENSITY: 3-5/10 usually, but it could go up to 10/10  - DURATION: comes and goes     She had 0 migraine days a month since 09/01/2020. She has almost constant nausea, but no vomiting.     Umeka is here today for a follow up visit, medications review and refills.   She noted the her headaches are significantly decreased, with no severe migraines.   She also reports that nausea is much improved. She never had vomiting.   She is taking daily supplements including Vit B2 and magnesium citrate.   She decreased the dose of verapamil from 80 to 40 mg for a week, and then stopped, monitored by her local neurologist.   Sleep remains a concern for her, and melatonin does not impact it.   Today we discussed about supplements and vitamins, foods rich in magnesium, sleep and neck pain,  and C-shape pregnancy pillow.       Extracted from the last visit notes in Sept 2022 by Dorrene German PA-C:  Robin Hart is here today for a follow up visit, medications review and refills.   She reports that she usually wakes up with a HA, then it comes and goes during the day. Sleep helps.   She prefers to take Excedrin Migraine and Coke, usually it helps.   She was recommended by her PCP to take Tylenol and Aleve x2 along with Coke (her cocktail). For prevention magnesium 500 mg, riboflavin and CoQ10 and butterbur.  She has HTN controlled now with Verapamil 40 mg daily and Losartan and her PB is now around 135/60.   She also started acupuncture and it seems to help.   Sleep is another concern for her.     She is also seeing Dr. Jerilynn Som at her local neurology clinic in  [place], last visit on 10/30/2020:  "Discussed a number of options, settled on acupuncture, for which there is evidence of efficacy for migraines. If her dizziness is related, that is if we suppose she has vestibular migraines (very possible) then this might help the dizziness. Also discussed verapamil and CGRP agents, and side effects, patient declined. Also discussed steroids or toradol, patient again declined. Warned about rebound."      ========================================    Physical Exam:  Ht 5' (1.524 m)    Wt 53.5 kg (117 lb 15.1 oz) Comment: per chart   BMI 23.03 kg/m    General: No acute distress, well-nourished, well-groomed, bright affect  Head: Normocephalic, atraumatic, normal hair distribution  Eyes: Sclera white, anicteric, conjunctiva clear  Neuro: oriented x3, follows conversation with ease    ========================================    ASSESSMENT AND PLAN:  Robin Hart is a very pleasant 80 year old with diagnosis of (G43.109) Migraine with vertigo  (primary encounter diagnosis)  (G43.109) Migraine with aura and without status migrainosus, not intractable  (G43.009) Migraine without aura and without status migrainosus, not intractable  (R51.9) Chronic daily headache    Recommended last visit:  1. Acute (abortive) medications:   - OK to take occasionaly Aleve and Tylenol for acute pain.   2. Preventive treatment:  - I agree to continue taking verapamil 40 mg. This dose could be increased to 80 mg, managed by your local neurologist.   - Continue Losartan for blood pressure control   3. Supplements   4. Neuromodulation      Recommended today on 03/23/2021:    1. Acute (abortive) treatment:   - It remains safe to take Aleve and Tylenol as needed for acute pain (please limit to 6 days per month, avoiding rebound headache)    2. Preventive treatment:  - Continue verapamil 40 mg daily and Losartan, managed by your local neurologist. It is OK to discontinue verapamil if you can tolerate it  and the symptoms do not return.     3. Natural approaches:   - Continue supporting the brain function by taking recommended supplements and vitamins    - Top 10 List of High Magnesium Foods:    1. Dark Leafy Greens (Spinach)    1 cup (180g) 200 calories (870g) 100g    39% DV (157mg ) 189% DV (757mg ) 22% DV (87mg )          More Greens High in Magnesium (%DV per cup):  Swiss Chard (38%), Kale (19%), Collard Greens (13%), and Turnip Greens (11%).  2. Seeds (Squash and Pumpkin Seeds)    1oz handful (28g) 200 calories (35g) 100g    39% DV (156mg ) 48% DV (192mg ) 138% DV (550mg )          More Seeds High in Magnesium (%DV per 1oz handful):  Hemp Seeds (50%), Flax (28%), Sesame (25%), Chia (24%), and Sunflower Seeds (9%).  Nuts and seeds are high in protein which helps with magnesium absorption.         3. Beans and Lentils (Lima Beans)    1 cup (170g) 200 calories (163g) 100g    31% DV (126mg ) 30% DV (120mg ) 19% DV (74mg )          More Beans High in Magnesium (%DV per cup):  White Beans (28%), [place] Beans (25%), Black-eyed Peas (23%), Kidney Beans (21%), Chickpeas (Garbanzo) (20%), Lentils (18%), and Pinto Beans (16%).         4. Whole Grains (Brown Rice)    1 cup (195g) 200 calories (179g) 100g    21% DV (86mg ) 20% DV (79mg ) 11% DV (44mg )          More Grains High in Magnesium (%DV per cup):  Quinoa (30%), Millet (19%), Bulgur (15%), Buckwheat (13%), Wild Rice (13%), Whole Wheat Pasta (11%), Barley (9%), and Oats (7%)         5. Fish (Mackerel)    3oz fillet (88g) 200 calories (76g) 100g    21% DV (85mg ) 19% DV (74mg ) 24% DV (97mg )          More Fish High in Magnesium (%DV per 3oz fillet):  Pollock (18%), Turbot (14%), Tuna (14%), and Cod (9%).  Fish are very high in protein, which helps with magnesium absorption.         6. Nuts (Almonds)    1oz handful (28g) 200 calories (35g) 100g    19% DV (77mg ) 23% DV (93mg ) 68% DV (270mg )          More Nuts High in Magnesium (%DV per 1oz handful):  Brazilnuts (27%), Cashews  (18%), Pine Nuts (18%), Hazelnuts (12%), Walnuts (11%), and Pecans (9%).  Nuts and seeds are high in protein which helps with magnesium absorption         7. Dark Chocolate (85% Cocoa)    1oz square (28g) 200 calories (33g) 100g    16% DV (65mg ) 19% DV (76mg ) 57% DV (228mg )          Other types of Chocolate (%DV per oz):  Baking Chocolate 99-100% Cocoa (24% DV), Dark Chocolate 60-69% Cocoa (12% DV), and Dark Chocolate 40-59% Cocoa (10% DV)         8. Yogurt    1 cup (245g) 200 calories (357g) 100g    12% DV (47mg ) 17% DV (68mg ) 5% DV (19mg )          1 cup of whole milk provides 6% DV for magnesium               9. Avocado    1 cup cubed (150g) 200 calories (125g) 100g    11% DV (44mg ) 9% DV (36mg ) 7% DV (29mg )          An average avocado provides 322 calories, half a cup pureed contains 184 calories         10. Bananas    1 medium (118g) 200 calories (225g) 100g    8% DV (32mg ) 15% DV (61mg ) 7% DV (27mg )          1 medium  banana contains just 105 calories.   Bananas are also a high potassium food.               Source: [employer]cy.php    Practice the basic rules of sleep hygiene for a better sleep:   Go to bed at the same time every day, including the weekends   Wake up at the same time every day, including the weekends   Keep a sleep diary. You can use an app on the smartphone (suggested example: Sleep Diary with Data Export, Sleep Cycle, Sleep as Android etc.)   Practice a bedtime routine: do your personal care, lights out, and reduced sources of noise up to two hours before the desired sleep time    Avoid naps, especially in the late afternoon. However, short naps lasting approximately 20 minutes can help alleviate daytime fatigue, sleepiness, and even provide cognitive benefit. Nap duration over 30 minutes is more likely than shorter nap duration to produce sleep inertia, a period of reduced alertness and cognitive performance after waking.   Exercise daily--  regular exercise is proved to have beneficial effects of total sleep duration, on ability to fall asleep faster, and on sleep quality.   Maintain a sleep environment conducive to sleep: bedroom comfortably cool (between 60 and 67 degrees Fahrenheit), use of blackout curtains, ear plugs, or sound machines to promote an optimal sleep environment    Sleep on a comfortable mattress and pillows.   Light therapy -- Regular bright light exposure in the mornings may help to maximize alertness and maintain a regular circadian rhythm.    Avoid cigarettes, caffeine, and heavy meals in the evening. While alcohol use does not seem to affect sleep duration, at least one study has reported that alcohol use (average 2.2 drinks per day) has a negative association with sleep quality.    Wind down with quiet activities that may promote sleep, such as reading with a dim light.    Avoid use of electronics at least 30 minutes before habitual bedtime and in the middle of the night if nocturnal awakenings occur. The blue light emitted from computer screens and hand-held devices can suppress natural melatonin production, resulting in difficulty falling asleep.   If you cannot sleep, do not look at a clock. Go into another room and do something relaxing until you feel drowsy enough to fall asleep again. Then return to bed.   Reduce stress and anxiety-provoking activities near bedtime. Self-regulation interventions such as mental imagery techniques have been shown to improve sleep-related behaviors and sleep patterns in adults.    Source: Up-To-Date      You may try a C-shape pregnancy pillow as discussed.    4. Could consider neuromodulation using medical devices ([name], Nerivio, GammaCore, sTMS mini, Relivion).    5. We recommend exercise, nutrition, and sleep recommendations for lifestyle management of headaches.    6. Consider our shared visits: Sleep 1 of 2 (with Mui Chan-Goh, ARNP, DNP)  We will be discussing overview of sleep,  sleep diary and general sleep hygiene . Any one who has migraines and trouble with sleep can benefit from this class.  You will receive an email one day to a week before your scheduled class date requesting you to complete a sleep survey through [employer]. Please complete this survey before the class as we will need this information to know how you are doing with sleep and headaches in general    Sleep 2 of  2 (with Mui Chan-Goh, ARNP, DNP)  Anyone with migraines and insomnia who has attended Sleep 1 of 2 can attend this class. We will be discussing Principles of Cognitive Behavioral Therapy for Insomnia (CBT-I).Please upload your sleep diary via [employer] survey prior to class as we will need this information to apply the CBT-I concepts learnt in class.     7. Follow up with Dorrene German PA-C in 3 months or sooner if any new problems related to headache management arise.    Benefits, side effects, and alternatives were discussed.   The patient understands all the recommendations.     The patient is agreeable with the plan.     I spent 39 minutes in total with the patient including face to face time doing a Telemedicine visit, reviewing chart notes, counseling regarding the diagnosis and treatment options and documenting the visit.       Jinny Sanders, PA-C   [facility]

## 2021-03-23 ENCOUNTER — Encounter (HOSPITAL_BASED_OUTPATIENT_CLINIC_OR_DEPARTMENT_OTHER): Payer: Self-pay | Admitting: Physician Assistant

## 2021-03-23 ENCOUNTER — Ambulatory Visit: Payer: Medicare PPO | Attending: Physician Assistant | Admitting: Physician Assistant

## 2021-03-23 VITALS — Ht 60.0 in | Wt 117.9 lb

## 2021-03-23 DIAGNOSIS — R519 Headache, unspecified: Secondary | ICD-10-CM | POA: Insufficient documentation

## 2021-03-23 DIAGNOSIS — G43109 Migraine with aura, not intractable, without status migrainosus: Secondary | ICD-10-CM | POA: Insufficient documentation

## 2021-03-23 DIAGNOSIS — G43009 Migraine without aura, not intractable, without status migrainosus: Secondary | ICD-10-CM | POA: Insufficient documentation

## 2021-03-23 NOTE — Patient Instructions (Signed)
ASSESSMENT AND PLAN:  Robin Hart is a very pleasant 80 year old with diagnosis of (G43.109) Migraine with vertigo  (primary encounter diagnosis)  (G43.109) Migraine with aura and without status migrainosus, not intractable  (G43.009) Migraine without aura and without status migrainosus, not intractable  (R51.9) Chronic daily headache    Recommended last visit:  1. Acute (abortive) medications:   - OK to take occasionaly Aleve and Tylenol for acute pain.   2. Preventive treatment:  - I agree to continue taking verapamil 40 mg. This dose could be increased to 80 mg, managed by your local neurologist.   - Continue Losartan for blood pressure control   3. Supplements   4. Neuromodulation      Recommended today on 03/23/2021:    1. Acute (abortive) treatment:   - It remains safe to take Aleve and Tylenol as needed for acute pain (please limit to 6 days per month, avoiding rebound headache)    2. Preventive treatment:  - Continue verapamil 40 mg daily and Losartan, managed by your local neurologist. It is OK to discontinue verapamil if you can tolerate it and the symptoms do not return.     3. Natural approaches:   - Continue supporting the brain function by taking recommended supplements and vitamins    - Top 10 List of High Magnesium Foods:    1. Dark Leafy Greens (Spinach)    1 cup (180g) 200 calories (870g) 100g    39% DV (157mg ) 189% DV (757mg ) 22% DV (87mg )          More Greens High in Magnesium (%DV per cup):  Swiss Chard (38%), Kale (19%), Collard Greens (13%), and Turnip Greens (11%).         2. Seeds (Squash and Pumpkin Seeds)    1oz handful (28g) 200 calories (35g) 100g    39% DV (156mg ) 48% DV (192mg ) 138% DV (550mg )          More Seeds High in Magnesium (%DV per 1oz handful):  Hemp Seeds (50%), Flax (28%), Sesame (25%), Chia (24%), and Sunflower Seeds (9%).  Nuts and seeds are high in protein which helps with magnesium absorption.         3. Beans and Lentils ([place]ima Beans)    1 cup (170g) 200  calories (163g) 100g    31% DV (126mg ) 30% DV (120mg ) 19% DV (74mg )          More Beans High in Magnesium (%DV per cup):  White Beans (28%), [place] Beans (25%), Black-eyed Peas (23%), Kidney Beans (21%), Chickpeas (Garbanzo) (20%), Lentils (18%), and Pinto Beans (16%).         4. Whole Grains (Brown Rice)    1 cup (195g) 200 calories (179g) 100g    21% DV (86mg ) 20% DV (79mg ) 11% DV (44mg )          More Grains High in Magnesium (%DV per cup):  Quinoa (30%), Millet (19%), Bulgur (15%), Buckwheat (13%), Wild Rice (13%), Whole Wheat Pasta (11%), Barley (9%), and Oats (7%)         5. Fish (Mackerel)    3oz fillet (88g) 200 calories (76g) 100g    21% DV (85mg ) 19% DV (74mg ) 24% DV (97mg )          More Fish High in Magnesium (%DV per 3oz fillet):  Pollock (18%), Turbot (14%), Tuna (14%), and Cod (9%).  Fish are very high in protein, which helps with magnesium absorption.  6. Nuts (Almonds)    1oz handful (28g) 200 calories (35g) 100g    19% DV (77mg ) 23% DV (93mg ) 68% DV (270mg )          More Nuts High in Magnesium (%DV per 1oz handful):  Brazilnuts (27%), Cashews (18%), Pine Nuts (18%), Hazelnuts (12%), Walnuts (11%), and Pecans (9%).  Nuts and seeds are high in protein which helps with magnesium absorption         7. Dark Chocolate (85% Cocoa)    1oz square (28g) 200 calories (33g) 100g    16% DV (65mg ) 19% DV (76mg ) 57% DV (228mg )          Other types of Chocolate (%DV per oz):  Baking Chocolate 99-100% Cocoa (24% DV), Dark Chocolate 60-69% Cocoa (12% DV), and Dark Chocolate 40-59% Cocoa (10% DV)         8. Yogurt    1 cup (245g) 200 calories (357g) 100g    12% DV (47mg ) 17% DV (68mg ) 5% DV (19mg )          1 cup of whole milk provides 6% DV for magnesium               9. Avocado    1 cup cubed (150g) 200 calories (125g) 100g    11% DV (44mg ) 9% DV (36mg ) 7% DV (29mg )          An average avocado provides 322 calories, half a cup pureed contains 184 calories         10. Bananas    1 medium (118g) 200 calories  (225g) 100g    8% DV (32mg ) 15% DV (61mg ) 7% DV (27mg )          1 medium banana contains just 105 calories.   Bananas are also a high potassium food.               Source: [employer].cy.php    Practice the basic rules of sleep hygiene for a better sleep:  Go to bed at the same time every day, including the weekends  Wake up at the same time every day, including the weekends  Keep a sleep diary. You can use an app on the smartphone (suggested example: Sleep Diary with Data Export, Sleep Cycle, Sleep as Android etc.)  Practice a bedtime routine: do your personal care, lights out, and reduced sources of noise up to two hours before the desired sleep time   Avoid naps, especially in the late afternoon. However, short naps lasting approximately 20 minutes can help alleviate daytime fatigue, sleepiness, and even provide cognitive benefit. Nap duration over 30 minutes is more likely than shorter nap duration to produce sleep inertia, a period of reduced alertness and cognitive performance after waking.  Exercise daily-- regular exercise is proved to have beneficial effects of total sleep duration, on ability to fall asleep faster, and on sleep quality.  Maintain a sleep environment conducive to sleep: bedroom comfortably cool (between 60 and 67 degrees Fahrenheit), use of blackout curtains, ear plugs, or sound machines to promote an optimal sleep environment   Sleep on a comfortable mattress and pillows.  Light therapy -- Regular bright light exposure in the mornings may help to maximize alertness and maintain a regular circadian rhythm.   Avoid cigarettes, caffeine, and heavy meals in the evening. While alcohol use does not seem to affect sleep duration, at least one study has reported that alcohol use (average 2.2 drinks per day) has a negative association with sleep  quality.   Wind down with quiet activities that may promote sleep, such as reading with a dim light.   Avoid  use of electronics at least 30 minutes before habitual bedtime and in the middle of the night if nocturnal awakenings occur. The blue light emitted from computer screens and hand-held devices can suppress natural melatonin production, resulting in difficulty falling asleep.  If you cannot sleep, do not look at a clock. Go into another room and do something relaxing until you feel drowsy enough to fall asleep again. Then return to bed.  Reduce stress and anxiety-provoking activities near bedtime. Self-regulation interventions such as mental imagery techniques have been shown to improve sleep-related behaviors and sleep patterns in adults.    Source: Up-To-Date      You may try a C-shape pregnancy pillow as discussed.    4. Could consider neuromodulation using medical devices ([name], Nerivio, GammaCore, sTMS mini, Relivion).    5. We recommend exercise, nutrition, and sleep recommendations for lifestyle management of headaches.    6. Consider our shared visits: Sleep 1 of 2 (with Mui Chan-Goh, ARNP, DNP)  We will be discussing overview of sleep, sleep diary and general sleep hygiene . Any one who has migraines and trouble with sleep can benefit from this class.  You will receive an email one day to a week before your scheduled class date requesting you to complete a sleep survey through [employer]. Please complete this survey before the class as we will need this information to know how you are doing with sleep and headaches in general    Sleep 2 of  2 (with Mui Chan-Goh, ARNP, DNP)  Anyone with migraines and insomnia who has attended Sleep 1 of 2 can attend this class. We will be discussing Principles of Cognitive Behavioral Therapy for Insomnia (CBT-I).Please upload your sleep diary via [employer] survey prior to class as we will need this information to apply the CBT-I concepts learnt in class.     7. Follow up with Dorrene German PA-C in 3 months or sooner if any new problems related to headache management  arise.    Benefits, side effects, and alternatives were discussed.   The patient understands all the recommendations.     The patient is agreeable with the plan.

## 2021-04-12 ENCOUNTER — Encounter (INDEPENDENT_AMBULATORY_CARE_PROVIDER_SITE_OTHER): Payer: Self-pay | Admitting: Otolaryngology

## 2021-04-27 ENCOUNTER — Telehealth (INDEPENDENT_AMBULATORY_CARE_PROVIDER_SITE_OTHER): Payer: Medicare PPO | Admitting: Otolaryngology

## 2021-04-28 ENCOUNTER — Telehealth (INDEPENDENT_AMBULATORY_CARE_PROVIDER_SITE_OTHER): Payer: Medicare PPO | Admitting: Otolaryngology

## 2021-04-28 ENCOUNTER — Encounter (INDEPENDENT_AMBULATORY_CARE_PROVIDER_SITE_OTHER): Payer: Self-pay | Admitting: Otolaryngology

## 2021-04-28 DIAGNOSIS — H6063 Unspecified chronic otitis externa, bilateral: Secondary | ICD-10-CM

## 2021-04-28 MED ORDER — FLUOCINOLONE ACETONIDE 0.01 % OT OIL
4.0000 [drp] | TOPICAL_OIL | Freq: Two times a day (BID) | OTIC | 2 refills | Status: DC | PRN
Start: 2021-04-28 — End: 2022-10-03

## 2021-04-28 NOTE — Progress Notes (Signed)
04/28/2021    Distant Site Telemedicine Encounter      I conducted this encounter via secure, live, face-to-face video conference with the patient. I reviewed the risks and benefits of telemedicine as pertinent to this visit and the patient agreed to proceed.    Provider Location: On-site location (clinic, hospital, on-site office)  Patient Location: At home  Present with patient: No one else present          PATIENT:  Robin Hart  DOB:  [birth year]  MRN:  [id number]    Interval update 04/28/2021:  Continues to have itchy ears.  Fluocinolone otic oil works better than Mycolog cream.  She is requesting a renewal.  Has stable pulsatile tinnitus and vestibular migraines.  Denies otalgia, otorrhea, and hearing loss.      CHIEF COMPLAINT/HPI 11/26/2020:  Patient is a 54 year oldfemalewho presented with migraine headache for 3 months, onset 5/22.  Symptoms are moderate, continuous, and unchanged. She saw Dr. Verdene Lennert for migraine associated dizziness.  Her [facility] neurologist also diagnosed migraine associated dizziness.  She had vestibular therapy in [place].  Associated symptoms include imbalance and tinnitus.  She denies otalgia, otorrhea, and hearing loss.    Patient also reported itchy ears for many years.  Symptoms are moderate, intermittent, and unchanged.  She uses Q-tips.  Symptoms improve temporarily with Mycolog cream.     09/2019 pulsatile tinnitus  LV 09/2019 ear itching, tinnitus.    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  MRA head/neck 02/27/17:  WNL    PHYSICAL EXAMINATION:    No vital signs were measured for this telemedicine visit.  Exam findings are limited and based solely on video visualization.    General: Well developed, appearing stated age and in no acute distress.  Participated in the patient interview appropriately.  Eyes: Clear conjunctive, normal lids. Extraocular movements intact.      IMPRESSION/PLAN:   1.  Chronic otitis externa  Prescribe fluocinolone otic oil      Kirke Shaggy, MD      Cc:  Rachel Moulds Mathews Robinsons, MD  Renelda [place], [place] [address]      I have spent 20 minutes on this visit today including chart review prior to the visit, face to face time with the patient, and documentation and coordination of care after the visit.

## 2021-04-28 NOTE — Patient Instructions (Signed)
I have renewed fluocinolone otic oil at your pharmacy.      Thank you for choosing [place] at [facility] to be a part of your health care team!  We care about your experience here and want your journey to be as frictionless as possible.  Your feedback is very important to our practice. Should you receive a [place] patient satisfaction survey, please let us know how we are doing.  [place] accepts 9-10 as OK and satisfied, 1-8 as no credit.  It is our goal to serve you with the highest quality care.                                          Thank you, Verdis Frederickson & Dr. Augustin Coupe  :)      Itchy Ears    Itchy ears are a very common problem. For some people the problem is so bad that they stick various objects into the ears, causing trauma to the ear canal. The most common causes of itching are a nervous habit, fungal infection or the beginning of an infection. Other causes can be skin diseases such as psoriasis or dermatitis. Some people with allergies complain of Itchy ears.    The ear canal may be normal on examination or there may be scaling of the skin. People aggravate the problem by using things such as bobby-pins, coat hangers and tooth picks to scratch the ear. This can produce abrasions of the ear canal.  Any break in the skin can allow bacteria to enter through this protective barrier. The ear will then become infected requiring it to be treated the same as swimmers ear.    Itching by itself without evidence of trauma or infection can be treated with a mild steroid ear drop.  A few drops placed in the ear will help to decrease the amount of itching. Another helpful treatment is the use of 70% alcohol (rubbing alcohol) as an ear drop. CAUTION: itchy ears may be the first signal that an infection is developing. If the ear is infected the alcohol will burn.

## 2021-06-28 ENCOUNTER — Telehealth (HOSPITAL_BASED_OUTPATIENT_CLINIC_OR_DEPARTMENT_OTHER): Payer: Self-pay | Admitting: Obstetrics/Gynecology

## 2021-06-28 NOTE — Telephone Encounter (Signed)
RETURN CALL: Voicemail - Detailed Message      SUBJECT:  Appointment Request     REASON FOR VISIT: chronic overactive bladder  PREFERRED DATE/TIME: next available with Dr. Baltazar Najjar   ADDITIONAL INFORMATION: No appointments with established provider available. Please advise.

## 2021-06-30 NOTE — Telephone Encounter (Signed)
Called and discussed with patient that Dr Baltazar Najjar did not have any avail appointments and we could add to wait list if they would like. Patient is now added to wait list

## 2021-08-09 ENCOUNTER — Encounter (HOSPITAL_BASED_OUTPATIENT_CLINIC_OR_DEPARTMENT_OTHER): Payer: Self-pay

## 2021-09-23 ENCOUNTER — Encounter (HOSPITAL_BASED_OUTPATIENT_CLINIC_OR_DEPARTMENT_OTHER): Payer: Self-pay | Admitting: Physician Assistant

## 2021-10-06 ENCOUNTER — Telehealth (INDEPENDENT_AMBULATORY_CARE_PROVIDER_SITE_OTHER): Payer: Self-pay | Admitting: Foot & Ankle Surgery

## 2021-10-06 NOTE — Telephone Encounter (Signed)
General Message:    Detailed Message: Niti would like to order another set of orthotics. Please upload the order form to [employer].   Return Call: No call back needed

## 2021-10-06 NOTE — Telephone Encounter (Signed)
I reviewed patient chart and she was given Quikstrides with accommodation, not orthotics.    I spoke with patient and relayed above information. We scheduled her with April for new devices on 10/07/21 at 9:00 am.

## 2021-10-07 ENCOUNTER — Ambulatory Visit (INDEPENDENT_AMBULATORY_CARE_PROVIDER_SITE_OTHER): Payer: Medicare PPO

## 2021-10-07 DIAGNOSIS — M7741 Metatarsalgia, right foot: Secondary | ICD-10-CM

## 2021-10-07 DIAGNOSIS — M7742 Metatarsalgia, left foot: Secondary | ICD-10-CM

## 2021-10-07 NOTE — Progress Notes (Signed)
Robin Hart presents today for another pair of modified Quikstrides. Please see Johnson's note.    Size 38 Quikstrides    Right  1/8" Korex 1st/5th ray accomodation cut to sulcus, backfilled with 1/16" PPT.    Right 1/8" Korex valgus wedge.    Left 1/8" Korex 1st ray accomodation cut to sulcus, backfilled with 1/16" PPT.     Approximately 15 minutes used to modify and dispense devices.

## 2021-10-25 ENCOUNTER — Other Ambulatory Visit (HOSPITAL_BASED_OUTPATIENT_CLINIC_OR_DEPARTMENT_OTHER): Payer: Self-pay | Admitting: Family Medicine

## 2021-11-10 ENCOUNTER — Encounter (INDEPENDENT_AMBULATORY_CARE_PROVIDER_SITE_OTHER): Payer: Self-pay | Admitting: Foot & Ankle Surgery

## 2021-11-10 ENCOUNTER — Ambulatory Visit (INDEPENDENT_AMBULATORY_CARE_PROVIDER_SITE_OTHER): Payer: Medicare PPO | Admitting: Foot & Ankle Surgery

## 2021-11-10 VITALS — BP 116/54 | HR 59 | Resp 16

## 2021-11-10 DIAGNOSIS — M79674 Pain in right toe(s): Secondary | ICD-10-CM

## 2021-11-10 NOTE — Progress Notes (Signed)
[facility]  PCP:  Angelita Ingles, MD    Chief Complaint:  81 year old female with   Chief Complaint   Patient presents with    Right 3rd Toe - Pain, Skin Lesion     New problem. She reports there is painful skin lesion on the inside of her toe x 1 month. She relates no injury though she did notice a bruise at the base of her toe. She states it cramps down and is really bothersome.      HPI: The patient presents with new problem regarding pain at right 3rd toe    Location: Painful lump at medial right 3rd toe  Duration/Onset Date:  Onset 1 month ago    Injury Cause: No specific history of injury. However, there was a prior bruise at the base of the toe.  The patient attributes the right third toe problem to her right hip condition.  Progression: The problem is persisting, getting worse    Aggravating Factors: Weightbearing activities, standing, walking  Treatment so far: Patient uses her Quikstride arch supports with modifications dispensed at last visit on 09/30/2020 and she wears them "all the time" and "actually had another pair made "; only wears [employer] shoes  Additional Information:  Patient was last seen on 09/30/2020 for follow-up regarding her lateral foot/ankle condition, right worse than left, especially the right fifth metatarsal head.      PAST MEDICAL HISTORY  Allergies:  Review of patient's allergies indicates:  Allergies   Allergen Reactions    Adhesives Skin: Itching    Hydroxychloroquine Dizziness    Other (See Comments)      Other reaction(s): Adverse Drug Reaction     Medications:  has a current medication list which includes the following prescription(s): alendronate, amlodipine, cetirizine, chlorthalidone, fluocinolone, losartan, omega-3, travoprost benzalkonium-free, triamcinolone, and verapamil.    Past Medical History:   has a past medical history of Allergic rhinitis due to allergen, Arthritis, Cancer ([place]), Constipation, Diverticulosis, Glaucoma, H. pylori infection, Headache,  History of diarrhea, History of irritable bowel syndrome, Hypertension, Lipidemia, Osteoporosis, and Primary insomnia.    Past Surgical History:   has a past surgical history that includes Bladder suspension; Basal cell carcinoma excision; pr colonoscopy stoma dx including collj spec spx; pr total abdominal hysterect w/wo rmvl tube ovary (04/04/1996); and Breast biopsy (Left, 05/27/2015).    Family History:  family history includes Breast Cancer in her maternal aunt; Breast Cancer (age of onset: 44) in her sister; Cancer in her mother; Diabetes in her daughter; GI in her mother; Hypertension in her sister; Myocardial Infarction in her brother; No Known Problems in her father. There is no history of Migraine Headaches or Ovarian Cancer.    ROS:  ROS is positive for musculoskeletal as mentioned in the HPI and as stated in the past medical history.      PHYSICAL EXAM:  BP (!) 116/54   Pulse (!) 59   Resp 16   SpO2 98%     GENERAL:  Pleasant, well-developed, well-nourished 81 year old female, alert and oriented, in no apparent distress.    VASCULAR:  Normal capillary refill with palpable pedal pulses.    NEUROLOGICAL:  Epicritic sensation is intact to light touch, bilateral.  No hypersensitivity to light touch.     DERMATOLOGICAL:   The skin has normal temperature turgor and texture.  No erythema or ecchymosis at this time.  No skin lesion at medial aspect of right 3rd digit.    MUSCULOSKELETAL: Palpable  soft tissue swelling with significant tenderness present at plantar lateral aspect of left third digit.  Ankle joint range of motion is supple without evidence of crepitus.  Foot type: Flexible high arch foot type, stands pronated, bilateral  Gait evaluation: Antalgic gait noted.    DIAGNOSTIC IMAGING:    XR Ankle 2 View Left    Result Date: 06/22/2020  EXAMINATION: XR FOOT 3 VW LEFT, XR ANKLE 2 VW LEFT CLINICAL INDICATION: foot and ankle pain COMPARISON: February 22, 2019 FINDINGS AND     Moderate first MTP joint  osteoarthritis. Focal lucency is suggested in the first metatarsal head and neck junction, possibly representing underlying cyst versus focal lytic lesion. Plantar calcaneal enthesophyte. Diffuse osteopenia.Intact ankle mortise. No soft tissue swelling about the ankle. No acute fracture.     XR Ankle 2 View Right    Result Date: 06/22/2020  EXAMINATION: XR ANKLE 2 VW RIGHT CLINICAL INDICATION: foot and ankle pain COMPARISON: No prior images are currently available for comparison. FINDINGS AND     There is no acute fracture or dislocation. There is no soft tissue swelling about the ankle. Ankle mortise is intact.    XR Foot 3 View Left    Result Date: 06/22/2020  EXAMINATION: XR FOOT 3 VW LEFT, XR ANKLE 2 VW LEFT CLINICAL INDICATION: foot and ankle pain COMPARISON: February 22, 2019 FINDINGS AND     Moderate first MTP joint osteoarthritis. Focal lucency is suggested in the first metatarsal head and neck junction, possibly representing underlying cyst versus focal lytic lesion. Plantar calcaneal enthesophyte. Diffuse osteopenia.Intact ankle mortise. No soft tissue swelling about the ankle. No acute fracture.     XR Foot 3 View Right    Result Date: 06/22/2020  EXAMINATION: XR FOOT 3 VW RIGHT CLINICAL INDICATION: foot and ankle pain COMPARISON: Ankle February 22, 2019 FINDINGS AND     Moderate first MTP joint osteoarthritis.No acute fracture or dislocation.     IMPRESSIONS:    (M79.674) Toe pain with swelling at medial right 3rd toe  (primary encounter diagnosis)    PLAN:    Detailed educational discussion took place today regarding the diagnoses, impressions, and spectrum of treatments. The patient was given an opportunity to ask questions.  Prior radiographs revealed advanced degenerative changes at the first metatarsophalangeal joint, both feet.  As before, patient relates that she feels like her foot symptoms may be coming from her right hip/low back issues.     The treatment plan recommended at this time includes the  following:    --Swelling at medial right 3rd toe is likely a ganglion.    -- Recommend patient consider obtaining silicone toe sleeve and/for silicone toe separator and/for tube foam--information dispensed for patient to obtain online.    --Treatment of inflammation:  Application of ice to affected area for 15 minutes twice a day.     --Application of Voltaren gel to affected area 2-3 times per day as needed.    --Recommend regular use of QS arch supports in [employer] shoes, even in the home.     --Future considerations include: Right 3rd toe check (suspected ganglion at plantar lateral right third toe (and possible xrays in 2-3 months; consider betamethasone injection at suspected ganglion; consider injection therapy at right fifth MTP and/or first MTP joint if symptoms recur/persist ; MRI if symptoms fail to improve as expected.    --Communication was emphasized to the patient to call the office immediately if there is any worsening of their current status.  Patient Instructions   --Swelling at medial right 3rd toe is likely a ganglion.    --Recommend obtaining silicone toe sleeve as shown below:      --Also, consider obtaining silicone toe separator as shown below:    --Alternative, tube foam to protect toe.    --Treatment of inflammation:  Application of ice to affected area for 15 minutes twice a day.     --Application of Voltaren gel to affected area 2-3 times per day as needed.      --Recommend regular use of QS arch supports in [employer] shoes, even in the home.     --Future considerations include: Right 3rd toe check and possible xrays in 2-3 months; consider betamethasone injection at suspected ganglion.    --Please call if your condition worsens or problems arise.   No orders of the defined types were placed in this encounter.    TIME SPENT:  I spent a total of 35 minutes on this visit today, including chart review prior to the visit, face-to-face time with the patient, and documentation/coordination of care  after the visit on date of service.      Follow up plan:  Return in about 2 months (around 01/10/2022) for right 3rd toe check and possible xrays in 2-3 months.    Charm Barges, DPM, FACFAS  [facility]    Portions of this document were created with voice-recognition software and may contain occasional errant or misspelled words.

## 2021-11-10 NOTE — Patient Instructions (Signed)
--  Swelling at medial right 3rd toe is likely a ganglion.    --Recommend obtaining silicone toe sleeve as shown below:      --Also, consider obtaining silicone toe separator as shown below:    --Alternative, tube foam to protect toe.    --Treatment of inflammation:  Application of ice to affected area for 15 minutes twice a day.     --Application of Voltaren gel to affected area 2-3 times per day as needed.      --Recommend regular use of QS arch supports in [employer] shoes, even in the home.     --Future considerations include: Right 3rd toe check and possible xrays in 2-3 months; consider betamethasone injection at suspected ganglion.    --Please call if your condition worsens or problems arise.

## 2022-01-01 ENCOUNTER — Ambulatory Visit
Admit: 2022-01-01 | Discharge: 2022-01-01 | Disposition: A | Discharge status: Home / Self Care | Payer: Medicare PPO | Attending: Diagnostic Radiology | Admitting: Diagnostic Radiology

## 2022-01-01 ENCOUNTER — Encounter (HOSPITAL_BASED_OUTPATIENT_CLINIC_OR_DEPARTMENT_OTHER): Payer: Self-pay

## 2022-01-01 DIAGNOSIS — Z1231 Encounter for screening mammogram for malignant neoplasm of breast: Secondary | ICD-10-CM | POA: Insufficient documentation

## 2022-01-01 DIAGNOSIS — Z803 Family history of malignant neoplasm of breast: Secondary | ICD-10-CM | POA: Insufficient documentation

## 2022-01-07 ENCOUNTER — Encounter (HOSPITAL_BASED_OUTPATIENT_CLINIC_OR_DEPARTMENT_OTHER): Payer: Self-pay

## 2022-01-31 ENCOUNTER — Encounter (INDEPENDENT_AMBULATORY_CARE_PROVIDER_SITE_OTHER): Payer: Medicare PPO | Admitting: Foot & Ankle Surgery

## 2022-04-07 ENCOUNTER — Ambulatory Visit (INDEPENDENT_AMBULATORY_CARE_PROVIDER_SITE_OTHER): Payer: Medicare PPO

## 2022-05-05 ENCOUNTER — Ambulatory Visit (INDEPENDENT_AMBULATORY_CARE_PROVIDER_SITE_OTHER): Payer: Medicare PPO

## 2022-05-12 ENCOUNTER — Encounter (INDEPENDENT_AMBULATORY_CARE_PROVIDER_SITE_OTHER): Payer: Self-pay | Admitting: Orthopaedic Surgery

## 2022-05-12 ENCOUNTER — Inpatient Hospital Stay (INDEPENDENT_AMBULATORY_CARE_PROVIDER_SITE_OTHER): Admit: 2022-05-12 | Discharge: 2022-05-12 | Disposition: A | Discharge status: Home / Self Care | Payer: Medicare PPO

## 2022-05-12 ENCOUNTER — Ambulatory Visit (INDEPENDENT_AMBULATORY_CARE_PROVIDER_SITE_OTHER): Payer: Medicare PPO | Admitting: Orthopaedic Surgery

## 2022-05-12 VITALS — BP 122/60

## 2022-05-12 DIAGNOSIS — M25551 Pain in right hip: Secondary | ICD-10-CM

## 2022-05-12 DIAGNOSIS — M5441 Lumbago with sciatica, right side: Secondary | ICD-10-CM

## 2022-05-12 DIAGNOSIS — M47817 Spondylosis without myelopathy or radiculopathy, lumbosacral region: Secondary | ICD-10-CM

## 2022-05-12 DIAGNOSIS — M4310 Spondylolisthesis, site unspecified: Secondary | ICD-10-CM

## 2022-05-12 DIAGNOSIS — M48061 Spinal stenosis, lumbar region without neurogenic claudication: Secondary | ICD-10-CM

## 2022-05-12 DIAGNOSIS — M25552 Pain in left hip: Secondary | ICD-10-CM

## 2022-05-12 DIAGNOSIS — G8929 Other chronic pain: Secondary | ICD-10-CM

## 2022-05-12 NOTE — Progress Notes (Signed)
Patient is here for B/L anterior hip pain that radiates to her toes. With cramping in calves and toes.    Tries to walk it out.   Pain level: 5-10/10    Also has right lateral hip pain.  Has additional pain after her bladder stimulator. They are not sure if there is an infection or nerve damage.     Last seen: 03/18/20  Treatments have included:   Medications: aleve prn   Injections: none  Therapies: walking, ice/heat occasionally  Surgery: right shoulder 04/2019  Imaging:    MRI L Spine 11/15/16  XR Ankle 2 Views Left 04/23/2018  MRI Shoulder Right 02/22/2019

## 2022-05-12 NOTE — Progress Notes (Signed)
05/12/2022    Burr Medico  [id number]    IDENTIFYING DATA:  Robin Hart is a pleasant 82 year old female who is known to me last seen 03/18/2020.  Returns today to discuss worsening back and hip and leg symptoms.    INTERIM HISTORY:  Robin Hart returns to discuss progression and worsening discomfort in the low back and hips but also referral with numbness and tingling cramping towards the calves and toes.  She often tries to walk it out but sometimes cannot.  Pain level varies anywhere from 5-10/10 often aggravated by static positions sometimes walking occasionally feels better with walking and she also does get some benefit at times with ice and heat but not always.  Some may she gets also seems to be in the anterior hip and groin.  Has not had x-rays of her hips as well far as she can remember at least not recently.  She is also been dealing with some potential adverse effects after having a bladder stimulator placed and having separate hip and low back issues.  Some concern if there is an infection or nerve damage.  This is newer.  Aware that she does have degeneration in her back including spondylolisthesis and she does wonder if this is contribute to her symptoms.  Treatments have included:   Medications: aleve prn   Injections: none  Therapies: walking, ice/heat occasionally  Surgery: right shoulder 04/2019  Imaging:   MRI L Spine 11/15/16    PMH/PSH/MEDICATIONS/FAMILY MEDICAL HISTORY/SOCIAL HISTORY:  See chart. Pertient changes described above.     MEDICATIONS/ALLERGIES:  See chart.     REVIEW OF SYSTEMS:  See Interim History    PHYSICAL EXAMINATION:  General: Awake, alert, and oriented, no apparent distress, pleasant, and cooperative  Vitals: Blood pressure 122/60, not currently breastfeeding.There is no height or weight on file to calculate BMI.  Neurologic/Musculoskeletal: Gait and movements are overall nonantalgic although slightly guarded.  She does have some restriction with hip range  of motion mainly in the right side causing some slight  discomfort.  She does not have any new specific sensory deficits to light touch testing lower limbs.  Equivocal neural tension signs.  There is a mild hip girdle weakness however both sides.  Gentle lumbar examination with standing extension is of some aggravation of symptoms and there is a slight relief with forward flexion.    IMAGING:  Past imaging was reviewed again which was MRI from 2018.    X-rays done today of the pelvis/hips shows some mild hip degeneration, nothing significant.  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  She does have symptoms of sciatica which has been predominant right-sided in the past but now also involving the left and also some symptoms consistent with claudication.  At this point updated imaging of her lumbar spine is therefore indicated an MRI was ordered  We also did hip x-rays today to see if she has arthritis that may be contributing some of her symptoms although imaging is overall unremarkable showing some mild changes nothing significant.  Follow-up with [place] after review of MRI.  Email or call with any questions or concerns.    I spent a total of 20 minutes for the patient's care on the date of the service.            Robin Hart Tomasa Rand, D.O.   Board  certified - Physical Medicine & Rehabilitation      Portions of this document were created with voice-recognition software

## 2022-05-13 ENCOUNTER — Telehealth (INDEPENDENT_AMBULATORY_CARE_PROVIDER_SITE_OTHER): Payer: Self-pay | Admitting: Orthopaedic Surgery

## 2022-05-13 NOTE — Telephone Encounter (Addendum)
Called patient to advise we will change to [facility]. Called to give her the number.

## 2022-05-13 NOTE — Addendum Note (Signed)
Addended by: Leighton Parody on: 05/13/2022 01:42 PM     Modules accepted: Orders

## 2022-05-13 NOTE — Telephone Encounter (Signed)
General Message:    Detailed Message: Pt heard from [facility] and they cannot do her MRI since she has a medtronic interstim sacral neuromodulator. They recommended [facility] or [facility]. Can we assist her with changing her order to the appropriate place pls?  Return Call: General message okay

## 2022-05-17 ENCOUNTER — Telehealth (INDEPENDENT_AMBULATORY_CARE_PROVIDER_SITE_OTHER): Payer: Self-pay | Admitting: Orthopaedic Surgery

## 2022-05-17 NOTE — Telephone Encounter (Signed)
General Message:    Detailed Message: Chaunice called [facility] to schedule her MRI. Unfortunately, they had not received the referral yet, so Sarrah would like it to be sent again. I called [facility] to get the fax number for their radiology department, but was not able to get anything but recordings.  Return Call: Detailed message on voicemail

## 2022-05-19 NOTE — Telephone Encounter (Signed)
She is now scheduled 3/11 at [facility].

## 2022-06-20 ENCOUNTER — Encounter (INDEPENDENT_AMBULATORY_CARE_PROVIDER_SITE_OTHER): Payer: Self-pay | Admitting: Orthopaedic Surgery

## 2022-06-22 NOTE — Telephone Encounter (Signed)
Sent my chart message to review lumbar MRI.   "  Dr. Hassan Buckler reviewed your MRI and the findings are similar to before. It does show spondylolisthesis, at L4-L5 where the vertebrae are slightly shifted forward and back. This is similar to before.   Also shows facet arthritis and some bony growth causing some moderate to severe narrowing on the right at L4-L5 level.     If you have mainly low back pain and stiffness, we can consider trying a diagnostic injection to target the arthritis   If you have pain in your buttock or leg we can consider trying an epidural steroid injection to help reduce inflammation and pain.     I can also call you tomorrow to discuss further. "

## 2022-06-23 NOTE — Telephone Encounter (Signed)
Patient declines to try an injection. She said her pain levels were not that bad. She will f/u as needed.

## 2022-06-28 NOTE — Telephone Encounter (Signed)
Appt made

## 2022-07-05 ENCOUNTER — Telehealth (INDEPENDENT_AMBULATORY_CARE_PROVIDER_SITE_OTHER): Payer: Medicare PPO | Admitting: Orthopaedic Surgery

## 2022-07-05 DIAGNOSIS — M48061 Spinal stenosis, lumbar region without neurogenic claudication: Secondary | ICD-10-CM

## 2022-07-05 DIAGNOSIS — R29898 Other symptoms and signs involving the musculoskeletal system: Secondary | ICD-10-CM

## 2022-07-05 DIAGNOSIS — M4310 Spondylolisthesis, site unspecified: Secondary | ICD-10-CM

## 2022-07-05 DIAGNOSIS — M5417 Radiculopathy, lumbosacral region: Secondary | ICD-10-CM

## 2022-07-05 NOTE — Progress Notes (Signed)
07/05/2022    Robin Hart  [id number]    IDENTIFYING DATA:  Robin Hart is a pleasant 82 year old female who is known to me and follows up today via telemedicine to discuss recent imaging and also evaluation of hip weakness and newer right buttock pain    INTERIM HISTORY:  Robin Hart follows up today via telemedicine to discuss symptoms, etiology and possible further treatment.  What issue she is dealing with is feeling of hip weakness.  This is more significant on the right side as she is tested up a single-leg stance.  She does wonder about etiology although aware it may well be her lumbar spine.  She does feel sometimes a little unstable on the right although she does continue to walk and walks on a regular basis.  Does have walking sticks/trekking poles but does not use them at this point mainly having use them with hiking.  She is aware of her changes in the back but does not have significant back pain per se and is mainly the functional that she is dealing with an occasional referral.  She is also dealing with separate irritation associated with a sacral stimulator implant for bladder issues.  This is located on the right side she does wonder if this can correlate with localized symptoms or if she is feeling that from the back.  She does notice that the symptoms did seem to start at the time of implant.    PMH/PSH/MEDICATIONS/FAMILY MEDICAL HISTORY/SOCIAL HISTORY:  See chart. Pertient changes described above.     MEDICATIONS/ALLERGIES:  See chart.     REVIEW OF SYSTEMS:  See Interim History    PHYSICAL EXAMINATION:  General: Awake, alert, and oriented, no apparent distress, pleasant, and cooperative  Vitals: not currently breastfeeding.There is no height or weight on file to calculate BMI.  Neurologic/Musculoskeletal: Provocative testing was not done today via telemedicine visit.     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  She understands.  Given that she does not have as much back pain is more of an ache and it is the hip and gluteal weakness and troubles her the most, working on strengthening this would be the mainstay treatment at this point at least to see if she is able to strengthen them or if she has persistent weakness due to radiculopathy.  She agrees and referral will be placed to physical therapy.  This will hopefully help her balance as well although I did encourage her to use the trekking poles when she is out walking just in case and hopefully she can then walk a little bit further.  Part of her desire to be lumbar active is also due to his bone density issues and therefore weightbearing exercises are important as well as supplementation of calcium and vitamin D.  Regarding the localized low back or buttock discomfort since sacral stimulator implant, she should follow-up with the provider, as this could be soft tissue related possibly small  sensory nerve irritation in the area of the implant.  We are also happy to palpate an area and evaluating clinic in person in the future.  Overall comfortable with our discussion and plan.  Follow-up with [place] in 2 months.  Email or call with any question or concern      Excluding the time spent performing the procedure, I spent a total of 25 minutes for the patient's care on the date of the service.          Jacque Byron Kathyrn Sheriff, D.O.   Board certified - Physical Medicine &  Rehabilitation      Portions of this document were created with voice-recognition software

## 2022-07-07 ENCOUNTER — Telehealth (INDEPENDENT_AMBULATORY_CARE_PROVIDER_SITE_OTHER): Payer: Self-pay | Admitting: Orthopaedic Surgery

## 2022-07-07 NOTE — Telephone Encounter (Signed)
General Message:    Detailed Message: Pt called, [facility] does not take her insurance. Can we pls change her referral to [facility] in [place] and fax this over to them?  Return Call: General message okay

## 2022-07-07 NOTE — Telephone Encounter (Signed)
Faxed referral to [facility]. Let patient know. Scheduled f/u.

## 2022-07-07 NOTE — Addendum Note (Signed)
Addended by: Inda Merlin on: 07/07/2022 11:13 AM     Modules accepted: Orders

## 2022-07-25 ENCOUNTER — Encounter (HOSPITAL_COMMUNITY): Payer: Self-pay

## 2022-08-18 ENCOUNTER — Telehealth (HOSPITAL_BASED_OUTPATIENT_CLINIC_OR_DEPARTMENT_OTHER): Payer: Self-pay | Admitting: Physician Assistant

## 2022-08-18 NOTE — Telephone Encounter (Signed)
Patient calling in wondering if anyone has advice beyond the supplements they were recommended back in 2022 by Yaakov Guthrie. They have been experiencing worsening and longer headaches and so were requesting to speak with a member of the care team to gain advice/guidance. In turn, scheduled them for the soonest available follow-up with Fay Records, ARNP, on Friday, 08/26/2022,  and then patched them through to our RN.

## 2022-08-18 NOTE — Telephone Encounter (Signed)
Relayed the provider's message below to the patient.    The patients needs to be seen for a follow up visit to address her concerns. We also need to review her medication list.  For now, she can contact PCP to discuss about uncontrolled high blood pressure, that could contribute to multiple of her symptoms.  If needed, she should go to ER if the symptoms are concerning.

## 2022-08-18 NOTE — Telephone Encounter (Signed)
The patient called in     Reported having headache pain for the last 14 days ( since 08/04/2022)    Triggered by trying to reposition during a PT session ( where she is being seen for balance concerns.) on a table on 08/04/2022    [facility]'s Last visit was in 03/2021.    New appointment set for 08/26/2022.    Pain level range from 6-9/10 for the last 14 days.    Pain description: generalized pain on the head- an ache.    Urgent care visit on 08/15/22.    Found BP was high SBP>180.    CT without contrast was done and the patient was told that it was normal.    Asked if MRI was done -said it was not done.     BP meds doses adjusted- particularly Amlodipine.    Urgent care provider recommended that she goes  to PCP.    PCP recommended CBD, patient obtain it- which has not helped the headache pain.     Continues to use all the Tips for acute HA/Migraine pain (list is on her fridge) with some relief including supplements as advised but in the last few days she added SAMe, and Boswellia but the pain has not improved. Has not gone to have a professional massage - plans to go try that, she is hydrating well. Hopes to get either cefaly or nerivio.    Used extra strength Tylenol and Aleve (4 different days in the last 14 days) with no relief so did not use them again.     She reports that this has happened to her before again while working with PT and the headache pain took so long to go away.    Other symptoms present currently are "some nausea and dizziness" which are not new symptoms but there have been an increase in the dizziness.    She said she stopped going to PT.     Advised the patient to go to ER if the pain increases or persists or has any new symptoms.    This TE routed to provider

## 2022-08-24 NOTE — Progress Notes (Signed)
NEUROLOGY HEADACHE CLINIC NOTE - Return Patient Visit    [facility] HEAD[facility] using Telemedicine    Distant Site Telemedicine Encounter  I conducted this encounter via secure, live, face-to-face video conference with the patient. I reviewed the risks and benefits of telemedicine as pertinent to this visit and the patient agreed to proceed.    Provider Location: On-site location (clinic, hospital, on-site office)  Patient Location: At home    Present with patient: No one else present      Headache Clinic Visits to date  - 01/18/2018 - Initial visit (by Dr. Berneta Sages)  - 12/22/2020 - Follow up visit #1 using Telemedicine (by Iris Pert, PA-C)   - 03/23/2021 - Follow up visit #2 using Telemedicine (by Iris Pert, PA-C)   - 08/26/2022 - Follow up visit using Telemedicine (by Isaiah Blakes, ARNP) Today    IDENTIFICATION DATA/CHIEF COMPLAINT:  Robin Hart is a 82 year old female who returns today to discuss her headaches.      Allergies, Past Medical History, Problem List, Family history and Social history reviewed per EPIC.     Comorbid diagnoses include: IBS, HTN, OSA (cannot tolerate CPAP), pulsatile tinnitus, glaucoma      Neuroimaging/ED visits:  No ED visits for headaches or imaging for headache within the past year noted     ========================================    HISTORY OF PRESENT ILLNESS  She reported the following features of her headaches per previous note on 03/23/2021 (by Genevieve Norlander, PA-C):   "- FREQUENCY (HA days/month): 15 days in a year when she has headaches, usually clustered in few episodes (LV: almost every day headaches and dizziness in Sept 2022, but sometimes she feels only dizziness or only headache)  - AVERAGE INTENSITY: 3-5/10 usually, but it could go up to 10/10  - DURATION: comes and goes      She had 0 migraine days a month since 09/01/2020. She has almost constant nausea, but no vomiting.      [name] is here today for a follow up visit, medications review and  refills.   She noted the her headaches are significantly decreased, with no severe migraines.   She also reports that nausea is much improved. She never had vomiting.   She is taking daily supplements including Vit B2 and magnesium citrate.   She decreased the dose of verapamil from 80 to 40 mg for a week, and then stopped, monitored by her local neurologist.   Sleep remains a concern for her, and melatonin does not impact it.   Today we discussed about supplements and vitamins, foods rich in magnesium, sleep and neck pain, and C-shape pregnancy pillow."    At the last visit the following recommendations were made:  -aleve and tylenol prn ok   -cont verapamil 40 mg daily, ok to dc   -working with other ha specialist in [place]     She currently reports:   - FREQUENCY (HA days/month): 30 in the last month and dizziness   She had 0 migraine days     INTERVAL HISTORY OF PRESENT ILLNESS:  [name] reports that starting in May she has been having worsening headaches and dizziness. She was working with PT and therapy rends to aggravate her headaches at time, but this time it is persisting.   She uses the migraine ice hat, and sleep has worked in the past, but not this time.   She also mentions that her blood pressure has been higher lately, her PCP  increased her amlodipine recently. Been monitoring her BP and sees that it is down, but not sure if it is down as much as it should be.  We will discuss the next steps today.     ========================================    Review of Systems   General: (-) fever, weight loss  CV: (-) chest pain  Pulm: (-) SOB, cough  GI: (-) abdominal pain  GU: (-) dysuria, hematuria  Neuro: + HA (-) visual changes, lightheadedness, dysarthria, dysphagia, neck pain, focal weakness, myalgia, gait dysfunction    ========================================    CURRENT MEDICATION LIST PER TODAY 08/26/2022:    ACUTE MEDICATIONS  Currently taking:   - Aleve   - Excedrin Migraine   - Tylenol      PREVENTIVE  MEDICATIONS FOR HEADACHE  Currently taking:    - Losartan for high blood pressure      Tried in the past:  - Amlodipine, replaced by verapamil   - Verapamil 40 mg daily - started one week ago in Sept 2022, she was recommended to increase the dose to 80      THERAPIES TRIED  - None      SUPPLEMENTS:  Currently taking:  - Magnesium glycinate 500 mg  - B2  - B12  - D3     Tried in the past:  - Fish oil     ========================================    ASSESSMENT AND PLAN  [name]  is a very pleasant, 82 year old with complicated diagnosis of (G43.711) Intractable chronic migraine without aura and with status migrainosus  (primary encounter diagnosis)    Our approach to every patient is multimodal.  I focused our discussion on addressing modifiable risk factors and trying to decrease them.    We discussed and recommend the following treatments. she understands and is agreeable with the plan. :    1. Preventive medications: these medications have to be taken every single day to decrease headache frequency and severity; it takes at least minimum of few weeks to see any results; and have to be taken for at least 1 to 2 years. The medication should be started at a small dose, and increased if no significant side effects.  Patient compliance is key for effectiveness of the preventive medications.     We discussed the options of beta-blockers, calcium channel blockers, SSRIs, SNRIs, neuron modulating options like topiramate.    Preventive medication advised today:   Will hold off for now     2. Acute (abortive) medications: these medications are to be taken only at the onset of severe headache.    Please limit a total of any combination of these medications (triptans + any over the counter analgesics including, tylenol, advil, aleve, excedrin... + medications such as meloxicam, celebrex, ketorolac) to no more than 4-6 days per month on average because they can cause medication overexposure headaches. For example, if you took  two days of advil and two days of sumatriptan, that would = 4 days. If you took 2 days of advil and two days of tylenol, that would = 4 days. If you are using OTC analgesics for other aches and pains, please consider topical products such as, voltaren, biofreeze, salon pas, or tiger balm.     Abortive medication advised today:   Could try Meclizine 12.5 mg as needed at onset of dizziness   Please take at home the first time to make sure it does not make you too sleepy.  Please try to limit to no more than  1-2 days in a week.     3. Supplements and natural approaches to increasing brain energy and serotonin:  Continue usual supplements     Could consider adding SAMe - https://www.[email or web address]/q?[email or web address]  Start 200 mg once daily for 1 week and can increase to 400 mg daily     Could increase Magnesium to 1000 mg daily - this can help with the headaches and constipation     4. Neuromodulation: We recommend a trial of the following medical device:  Please visit www.cefaly.com for more information or to purchase. No prescription needed.   20 mins in the AM and PM for prevention   1 hour at the onset of symptoms for acute treatment  90 day money back guaranteed!  Take advantage of the 90 day trial, use the device every day in order to complete an adequate trial of the device.   [name] is neuromodulation device used for both acute (abortive) and preventive treatment  for migraine sufferers.  It enables the use of medicines to be significantly reduced and  quality of life can be markedly improved. Several published clinical studies have demonstrated its excellent effectiveness and its complete safety. More than 100,000 [name] devices are in use. Data is available since 2008.[name] has been approved by FDA for use under prescription.  [name] is positioned on the forehead using an adhesive electrode.   Precise impulses are produced, which act on the trigeminal nerve in order to prevent migraine attacks.  Use 20 minutes a  day every day, use extra if you have a headache to stop it.  The side effects of [name]  Observed in 4.3% of patients   Mild and completely reversible   Most common: intolerance to the feeling of [name] on the forehead   Most severe: allergic skin reaction to the electrode  The most common side effects  Intolerance to the feeling of [name] on the forehead: 1.25%   Sensation of fatigue during and after the session: 0.65%   Headache after one session: 0.52%   Irritation of the skin on the forehead: 0.22%  Please contact [name] [place] with any questions  [address]  [place], [place] [address], [place]  Email: [email or web address]  Phone: [phone]    Utilize your happy lamp     5. DIET: I recommend anti-inflammatory diet MIND diet    6. EXERCISE: Gentle movement exercises, concentrate on combination of muscle building and aerobic activity    Deep breathing and mindfulness techniques  "Calm" mindfulness app  "[name]" app for Cognitive Behavioral Therapy for chronic pain  Attend the Biofeedback and CBT Shared Medical Visits  [name] programs and devices    7. FUTURE RECOMMENDATIONS:  Could consider starting Propranolol or Duloxetine    Benefits, side effects, and alternatives were discussed.     Gay understands all the recommendations.     she is going to follow up with Genevieve Norlander, [facility] in July.   Please be sure to follow up with the clinic about 1-2 months prior to the expiration of your current prior authorizations in order to avoid any delays in obtaining your medications that require special processing with insurance (ie nurtec, ajovy, emgality, aimovig, botox, qulipta, ubrelvy, zavzpret).     Here are a few tips to improve your healthcare:    Refills: Call your pharmacy at least 7 working days before you run out. Please do not call the clinic for refills, it's quicker and safer to go through your pharmacy. We  usually prescribe the medication for one year. If you continue to receive the  prescription from [place], we require at least one yearly visit for refills.        Please schedule your appointment one month prior to running out.    Test Results: Available in 1-2 weeks on eCare. We will contact you sooner by telephone if there is something more urgent.    Urgent Symptoms: Please do not leave urgent messages. We are not an urgent clinic, we do not offer same day imaging or infusions. In the case of an emergency, please call 911 or head to the neared emergency room to be evaluated.      Other Questions: Use eCare to securely e-mail [place]. If you have a long or complex question or a new issue, please make an appointment, call [phone], day or night, and select option 2. To sign up for ecare call [phone] or ask your medical assistant to sign you up when in clinic.    Headache Clinic Policies:    - You need be seen at least once a year to continue safely receiving prescriptions from [place].  - We do not coordinate disability or social security, however we can provide our note to your primary care provider to help you coordinate the process.  - We can assist with intermittent family medical leave paperwork (FMLA); filling out the paperwork requires a face-to-face office visit.   - Schedule appointments ahead of time; we do not have urgent, same-day appointments available.  - We do not prescribe opioids or other controlled substances aside from Lyrica.  - To assure best access to care for all of our patients, we require at least 48 hours notice prior to cancelling appointments.    We discussed with the patient their diagnosis; proposed treatment(s), risks and benefits of treatment; alternative treatments, their risks and benefits; prognosis with and without treatment, probable course of recovery. The patient is able to understand and repeat given information and is agreeable to the plan and recommendations. The patient had adequate time to ask questions, and had no further questions.      Total time,  including review of the medical record in preparation for the visit, direct face-to-face  time, and documentation, was 37 minutes on the date of service    Isaiah Blakes, ARNP

## 2022-08-26 ENCOUNTER — Ambulatory Visit: Payer: Medicare PPO

## 2022-08-26 ENCOUNTER — Encounter (HOSPITAL_BASED_OUTPATIENT_CLINIC_OR_DEPARTMENT_OTHER): Payer: Self-pay

## 2022-08-26 VITALS — Ht 61.0 in | Wt 120.0 lb

## 2022-08-26 DIAGNOSIS — G43711 Chronic migraine without aura, intractable, with status migrainosus: Secondary | ICD-10-CM

## 2022-08-26 DIAGNOSIS — R42 Dizziness and giddiness: Secondary | ICD-10-CM

## 2022-08-26 MED ORDER — MECLIZINE HCL 12.5 MG OR TABS
12.5000 mg | ORAL_TABLET | Freq: Three times a day (TID) | ORAL | 11 refills | Status: DC | PRN
Start: 2022-08-26 — End: 2023-08-31

## 2022-08-26 NOTE — Patient Instructions (Signed)
It was a pleasure seeing you today. Below is the plan we discussed.   Our approach to every patient is multimodal.  I focused our discussion on addressing modifiable risk factors and trying to decrease them.    We discussed and recommend the following treatments. she understands and is agreeable with the plan. :    1. Preventive medications: these medications have to be taken every single day to decrease headache frequency and severity; it takes at least minimum of few weeks to see any results; and have to be taken for at least 1 to 2 years. The medication should be started at a small dose, and increased if no significant side effects.  Patient compliance is key for effectiveness of the preventive medications.     We discussed the options of beta-blockers, calcium channel blockers, SSRIs, SNRIs, neuron modulating options like topiramate.    Preventive medication advised today:   Will hold off for now     2. Acute (abortive) medications: these medications are to be taken only at the onset of severe headache.    Please limit a total of any combination of these medications (triptans + any over the counter analgesics including, tylenol, advil, aleve, excedrin... + medications such as meloxicam, celebrex, ketorolac) to no more than 4-6 days per month on average because they can cause medication overexposure headaches. For example, if you took two days of advil and two days of sumatriptan, that would = 4 days. If you took 2 days of advil and two days of tylenol, that would = 4 days. If you are using OTC analgesics for other aches and pains, please consider topical products such as, voltaren, biofreeze, salon pas, or tiger balm.     Abortive medication advised today:   Could try Meclizine 12.5 mg as needed at onset of dizziness   Please take at home the first time to make sure it does not make you too sleepy.  Please try to limit to no more than 1-2 days in a week.     3. Supplements and natural approaches to increasing  brain energy and serotonin:  Continue usual supplements     Could consider adding SAMe - https://www.[email or web address]/q?[email or web address]  Start 200 mg once daily for 1 week and can increase to 400 mg daily     Could increase Magnesium to 1000 mg daily - this can help with the headaches and constipation     4. Neuromodulation: We recommend a trial of the following medical device:  Please visit www.cefaly.com for more information or to purchase. No prescription needed.   20 mins in the AM and PM for prevention   1 hour at the onset of symptoms for acute treatment  90 day money back guaranteed!  Take advantage of the 90 day trial, use the device every day in order to complete an adequate trial of the device.   [name] is neuromodulation device used for both acute (abortive) and preventive treatment  for migraine sufferers.  It enables the use of medicines to be significantly reduced and  quality of life can be markedly improved. Several published clinical studies have demonstrated its excellent effectiveness and its complete safety. More than 100,000 [name] devices are in use. Data is available since 2008.[name] has been approved by FDA for use under prescription.  [name] is positioned on the forehead using an adhesive electrode.   Precise impulses are produced, which act on the trigeminal nerve in order to prevent migraine attacks.  Use 20 minutes a day  every day, use extra if you have a headache to stop it.  The side effects of [name]  Observed in 4.3% of patients   Mild and completely reversible   Most common: intolerance to the feeling of [name] on the forehead   Most severe: allergic skin reaction to the electrode  The most common side effects  Intolerance to the feeling of [name] on the forehead: 1.25%   Sensation of fatigue during and after the session: 0.65%   Headache after one session: 0.52%   Irritation of the skin on the forehead: 0.22%  Please contact [name] [place] with any questions  [name]  [address]  [place], [place] [address], [place]  Email: [email or web address]  Phone: [phone]    Utilize your happy lamp     5. DIET: I recommend anti-inflammatory diet MIND diet    6. EXERCISE: Gentle movement exercises, concentrate on combination of muscle building and aerobic activity    Deep breathing and mindfulness techniques  "Calm" mindfulness app  "[name]" app for Cognitive Behavioral Therapy for chronic pain  Attend the Biofeedback and CBT Shared Medical Visits  [name] programs and devices    7. FUTURE RECOMMENDATIONS:  Could consider starting Propranolol or Duloxetine    Benefits, side effects, and alternatives were discussed.     Robin Hart understands all the recommendations.     she is going to follow up with Robin Hart, [facility] in July.   Please be sure to follow up with the clinic about 1-2 months prior to the expiration of your current prior authorizations in order to avoid any delays in obtaining your medications that require special processing with insurance (ie nurtec, ajovy, emgality, aimovig, botox, qulipta, ubrelvy, zavzpret).     Here are a few tips to improve your healthcare:    Refills: Call your pharmacy at least 7 working days before you run out. Please do not call the clinic for refills, it's quicker and safer to go through your pharmacy. We usually prescribe the medication for one year. If you continue to receive the prescription from [place], we require at least one yearly visit for refills.        Please schedule your appointment one month prior to running out.    Test Results: Available in 1-2 weeks on eCare. We will contact you sooner by telephone if there is something more urgent.    Urgent Symptoms: Please do not leave urgent messages. We are not an urgent clinic, we do not offer same day imaging or infusions. In the case of an emergency, please call 911 or head to the neared emergency room to be evaluated.      Other Questions: Use eCare to securely e-mail [place]. If you have a long or complex question  or a new issue, please make an appointment, call [phone], day or night, and select option 2. To sign up for ecare call [phone] or ask your medical assistant to sign you up when in clinic.    Headache Clinic Policies:    - You need be seen at least once a year to continue safely receiving prescriptions from [place].  - We do not coordinate disability or social security, however we can provide our note to your primary care provider to help you coordinate the process.  - We can assist with intermittent family medical leave paperwork (FMLA); filling out the paperwork requires a face-to-face office visit.   - Schedule appointments ahead of time; we do not have urgent, same-day appointments  available.  - We do not prescribe opioids or other controlled substances aside from Lyrica.  - To assure best access to care for all of our patients, we require at least 48 hours notice prior to cancelling appointments.

## 2022-09-12 ENCOUNTER — Encounter (HOSPITAL_BASED_OUTPATIENT_CLINIC_OR_DEPARTMENT_OTHER): Payer: Self-pay

## 2022-09-23 ENCOUNTER — Other Ambulatory Visit (INDEPENDENT_AMBULATORY_CARE_PROVIDER_SITE_OTHER): Payer: Self-pay | Admitting: Otolaryngology

## 2022-09-23 DIAGNOSIS — H6063 Unspecified chronic otitis externa, bilateral: Secondary | ICD-10-CM

## 2022-09-26 NOTE — Telephone Encounter (Signed)
Lvm for patient. Schedule an appointment to discuss refill request. Provided front desk phone number for scheduling.

## 2022-09-26 NOTE — Telephone Encounter (Signed)
Patient needs an appointment.  Nash Dimmer, CMA

## 2022-09-30 ENCOUNTER — Encounter (INDEPENDENT_AMBULATORY_CARE_PROVIDER_SITE_OTHER): Payer: Medicare PPO | Admitting: Orthopaedic Surgery

## 2022-10-03 ENCOUNTER — Telehealth (INDEPENDENT_AMBULATORY_CARE_PROVIDER_SITE_OTHER): Payer: Medicare PPO | Admitting: Otolaryngology

## 2022-10-03 ENCOUNTER — Encounter (INDEPENDENT_AMBULATORY_CARE_PROVIDER_SITE_OTHER): Payer: Self-pay | Admitting: Otolaryngology

## 2022-10-03 DIAGNOSIS — H6063 Unspecified chronic otitis externa, bilateral: Secondary | ICD-10-CM

## 2022-10-03 MED ORDER — FLUOCINOLONE ACETONIDE 0.01 % OT OIL
4.0000 [drp] | TOPICAL_OIL | Freq: Two times a day (BID) | OTIC | 3 refills | Status: AC | PRN
Start: 2022-10-03 — End: ?

## 2022-10-03 NOTE — Patient Instructions (Signed)
I renewed fluocinolone otic oil for your itchy ear.  Use this as needed.        Thank you for choosing [place] at [facility] to be a part of your health care team!  We care about your experience here and want your journey to be as frictionless as possible.  Your feedback is very important to our practice. Should you receive a [place] patient satisfaction survey, please let us know how we are doing.  [place] accepts 9-10 as OK and satisfied, 1-8 as no credit.  It is our goal to serve you with the highest quality care.                                          Thank you, Byrd Hesselbach & Dr. Juel Burrow  :)

## 2022-10-03 NOTE — Progress Notes (Signed)
10/03/2022    Distant Site Telemedicine Encounter      I conducted this encounter via secure, live, face-to-face video conference with the patient. I reviewed the risks and benefits of telemedicine as pertinent to this visit and the patient agreed to proceed.    Provider Location: On-site location (clinic, hospital, on-site office)  Patient Location: At home  Present with patient: No one else present          PATIENT:  Robin Hart  DOB:  24-Apr-1940  MRN:  [id number]      Interval update 10/03/2022:  Here for fluocinolone otic oil renewal for itchy ears.  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  Takes supplements including magnesium for this.  Is working with [facility].      Interval update 04/28/2021:  Continues to have itchy ears.  Fluocinolone otic oil works better than Mycolog cream.  She is requesting a renewal.  Has stable pulsatile tinnitus and vestibular migraines.  Denies otalgia, otorrhea, and hearing loss.      CHIEF COMPLAINT/HPI 11/26/2020:  Patient is a 82 year old female who presented with migraine headache for 3 months, onset 5/22.  Symptoms are moderate, continuous, and unchanged. She saw Dr. Will Bonnet for migraine associated dizziness.  Her [facility] neurologist also diagnosed migraine associated dizziness.  She had vestibular therapy in [place].  Associated symptoms include imbalance and tinnitus.  She denies otalgia, otorrhea, and hearing loss.    Patient also reported itchy ears for many years.  Symptoms are moderate, intermittent, and unchanged.  She uses Q-tips.  Symptoms improve temporarily with Mycolog cream.     09/2019 pulsatile tinnitus  LV 09/2019 ear itching, tinnitus.     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  MRA head/neck 02/27/17:  WNL     PHYSICAL EXAMINATION:    No vital signs were measured for this telemedicine visit.  Exam findings are limited and based solely on video visualization.    General: Well developed, appearing stated age and in no acute distress.  Participated in the patient interview appropriately.  Eyes: Clear conjunctive, normal lids. Extraocular movements intact.       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         Doreene Nest, MD        Cc:   Arnoldo Morale Cleotilde Neer, MD  Maudry Mayhew, MD  [address]  [place], [place] [address]      I have spent 20 minutes on this visit today including chart review prior  to the visit, face to face time with the patient, and documentation and coordination of care after the visit.

## 2022-10-25 ENCOUNTER — Ambulatory Visit (HOSPITAL_BASED_OUTPATIENT_CLINIC_OR_DEPARTMENT_OTHER): Payer: Medicare PPO | Admitting: Physician Assistant

## 2022-10-31 ENCOUNTER — Ambulatory Visit: Payer: Medicare PPO | Admitting: Physician Assistant

## 2022-10-31 VITALS — Ht 61.0 in | Wt 119.9 lb

## 2022-10-31 DIAGNOSIS — G43711 Chronic migraine without aura, intractable, with status migrainosus: Secondary | ICD-10-CM

## 2022-10-31 NOTE — Patient Instructions (Signed)
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 Acute (abortive) treatment:   - OK to take naproxen occasionally for pain (2-4 days per month)  - OK to take meclizine for dizziness  - OK to take scopolamine patch for motion sickness while in [place] or on a boat. Please take the box with to any pharmacy and ask about clear directions how to use it and how to store it.   - OK to tale Flonase, Zyrtec, Mucinex for post-nasal drip     2. Preventive treatment:  - Hold off for now   - Continue to work with your PCP monitoring and controlling blood pressure Robin Hart is currently taking losartan, amlodipine and HCTZ for blood pressure control).    3. Natural approaches:   - Continue supporting the brain function by taking recommended supplements and vitamins  - Start light therapy using a Happy Lamp (S.A.D. lamp) in the fall and winter season. During the summer, long walks outside around 9:00a.m. are more appropriate   - Continue magnesium 500 mg a.m. + 500 mg p.m. (total of 1000 mg daily)     4. Start using [name] medical device for neuromodulation, if the headaches return.   - Ice cap     5. We recommend exercise, nutrition, and sleep recommendations for lifestyle management of headaches.    6. Consider our shared visits:   Our clinic offers virtual shared visits, providing helpful tools to patients who cope with headaches and pain. All group visits are telemedicine visits using Zoom app. Please call the [facility] front desk for appointments and a walk though the telemedicine process (phone number [phone], option 2).     Empowered Relief   A Fresh Approach to Managing Your Headache and Pain! Have you been struggling with ongoing headaches or pain? We're excited to offer you a brand-new way to find relief and take  control of your well-being. Join [place] for "Empowered Relief," a special one-time session designed by [employer], and delivered to you by trained experts at the [facility].    What's Empowered Relief all about? It's a program backed by research, made to help you tackle pain head-on. In just one session, you'll discover new ways to understand and manage your pain. You'll learn about:  Pain Education: Understand what causes your pain and how it affects you, so you can feel more in control.  Relaxation Techniques: Find out how to ease tension in your body, which can help lessen pain.  Cognitive Reframing: Learn to shift your thoughts about pain, which can change how you feel about it.  Mindfulness: Gain tools to stay present and reduce stress, helping you manage pain better.    This session is a key part of our clinic's comprehensive approach to pain treatment. We believe in tackling pain from all angles, and Empowered Relief is a powerful tool in our toolbox.     Steps to Success (with Dr. Maxie Better)  Headaches are complex. This class covers general information about this vast territory of how the brain works, why we develop migraines, and options for treatments.     Nutrition Shared Visit (with Dr. Maxie Better)  Nutrition is very important for people with headaches. You may have noticed that there are certain foods that can trigger a headache, but  the full story goes deeper than that. This class provides an interactive group format to learn scientifically based information on the relationship between brain health and diet. We'll go over various nutritional concepts important for managing headache, including anti-inflammatory diets, ketogenic diets, the microbiome, and weight loss. Come with all your questions and an open mind!    Biofeedback (with Dr. Maxie Better)  Stress affects the entire body and can cause physical and psychological problems. Over time, unchecked stress can cause the  body to stay in a constant and chronic state of stress promoting migraines, fatigue and illness. Research has demonstrated several types of biofeedback practices can be helpful to reduce stress and regulate heart rate variability. In this class you will discover your stress baseline through using [facility] electronic educational learning devices. After practicing several [name] stress management techniques, you will be able to monitor your progress by checking your stress baseline and heart rhythm patterns with the educational learning device while in class.    Living Well with Stress Series - CBT (with Dr. Maxie Better)  Are you feeling stressed out all the time? Well, you're not alone. Stress is a pervasive problem for most Americans: most visits to healthcare providers in the [place] are related to stress. Stress can profoundly affect overall health, especially for people with a history of migraine or other chronic illness.    Exercise Shared Visit (with Genevieve Norlander, PA-C)  Exercise is an important part of multimodal headache management. Exercise addresses pain through serotonin release, reduces stress, and improves the quality of sleep. It's basically a wonder drug! Migraine, cervicogenic headache, post-concussion recovery, and pregnancy-related headache all benefit from exercise. Many of our patients tell us that it's difficult to start exercising because of their headaches. This session discusses the benefits of exercise, and talks about how to start exercise gently and safely, and the best types and amounts of exercise. So, let's get started today and feel better every day!    Neuromodulation (with Genevieve Norlander, PA-C)  A multimodal headache management is more efficient than medications alone. Part of this treatment is neuromodulation with medical devices designed to treat headaches. Let's explore this avenue, controlling pain and brain function with this safe non-medication option.    Medication  Overuse Headache (with Dr. Berneta Sages)  Many [facility] patients have had disabling headaches for many years. One of the most common diagnoses in our patients with migraine is medication overuse headache. Medication overuse headache can occur with frequent use of medications such as Tylenol, Advil, triptans, and many others. Taking just ten pills per month of these medications can cause medication overuse headache. For patients to get the best results, it is important to address medication overuse headache as soon as possible. This class describes medication overuse headache, why it occurs, and provides alternative strategies to treat acute headache.    Headache SOS/Bridge Therapy Options (with Dr. Berneta Sages)  Some patients experience periods during which their headaches become much worse than usual. Headaches can become so severe during these periods that patients end up visiting Urgent Care or the Emergency Room. Some therapies, called "bridge therapies," can be used for a short time period (one week to about a month), to help patients with worsened headache. These therapies are safer than using medications like Tylenol or Advil, or opioid painkillers. This class reviews a range of "bridge therapy" options, about which patients can speak to headache providers or primary care providers, so that a plan is in place ahead  of time.    Newer Medications for Headache (with Dr. Berneta Sages)  Several medications have been developed specifically for migraine and cluster headache since 2018. Some of these are acute treatments, which are taken occasionally, when you have a severe headache episode. Others are preventive, which are taken regularly to decrease the likelihood of having a headache episode. For example, one type of preventive medication is a monthly injection you give to yourself at home. This session goes over new headache medications and discusses their best uses and side effects.    Lidocaine (with Dr. Berneta Sages)  Treating headaches with medication can be difficult when you experience side effects, drug interactions, or have other medical conditions that limit your options. Procedures using lidocaine can be very effective at treating and preventing headaches and are safe for almost everyone - no matter what side effects you've experienced with other treatments, or what other medications you're taking. They're even a good option for pregnant women. This shared visit reviews three different procedures to consider for your headaches: occipital nerve blocks, sphenopalatine ganglion (SPG) blocks, and at home SPG blocks.    The sleep series (with Mui Chan-Goh, ARNP)  Insomnia is very common in headache patients. Headaches can ruin your sleep, and insomnia can make headaches worse, creating a vicious cycle. The best treatment for chronic insomnia is Cognitive-Behavioral Therapy for Insomnia (CBT-I); it can improve your sleep without any need for sleeping pills. We will discuss the principles of CBT-I. Please upload your sleep diary via [employer] survey prior to class as we will need this information to apply the CBT-I concepts learnt in class.    Botox (with Fay Records, ARNP)  If you are interested in BOTOX therapy for chronic migraine, join [place] to learn how BOTOX works, the benefits and side effects and how we decide if this is a good option for our patients. BOTOX (onabotulinumtoxinA) is a prescription medicine that is injected into muscles and used to prevent headaches in adults with chronic migraine who have 15 or more days each month with headache lasting 4 or more hours each day in people 18 years and older. Botox injections are approved for the treatment of chronic migraine since 2010. Please join [place] for this shared visit and we will do our best to answer all questions regarding BOTOX therapy for chronic migraine.    7. Follow up with Genevieve Norlander PA-C in 3 months or sooner if any new problems related to headache  management arise.      Benefits, side effects, and alternatives were discussed.   The patient understands all the recommendations.     The patient is agreeable with the plan.

## 2022-10-31 NOTE — Progress Notes (Signed)
[facility] HEADAC[facility] CLINIC using Telemedicine    Distant Site Telemedicine Encounter  I conducted this encounter via secure, live, face-to-face video conference with the patient. I reviewed the risks and benefits of telemedicine as pertinent to this visit and the patient agreed to proceed.    Provider Location: On-site location (clinic, hospital, on-site office)  Patient Location: At home    Present with patient: No one else present        Headache Clinic Visits to date  - 01/18/2018 - Initial visit (by Berneta Sages, MD)  - 12/22/2020 - Follow up visit #1 using Telemedicine (by Iris Pert, PA-C)   - 03/23/2021 - Follow up visit #2 using Telemedicine (by Iris Pert, PA-C)   - 08/26/2022 - Follow up visit using Telemedicine (by Isaiah Blakes, ARNP)   - 09/02/2022 - Video Visit with [facility] [place] - for Headache disorder  - 10/31/2022 - Follow up visit using Telemedicine (by Iris Pert, PA-C) Today    IDENTIFICATION DATA/CHIEF COMPLAINT:  Robin Hart is a 82 year old female who returns today to discuss her headaches.      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 She plans to take it with her in [place]  - Scopolamine for motion sickness - she has it with her for [place] trip   - Flonase, Zyrtec, Mucinex daily for post-nasal drip after URI from her grandchildren     Tried in the past:  - Excedrin Migraine - 0 days in July 2024   - Tylenol - 1 day in July 2024 when she had a covid19 infection in [place]     Neuromodulation  - [name] - she purchased it but did not take it  out of the box   - Happy Lamp - she purchased it but did not take it out of the box   - Ice cap      PREVENTIVE MEDICATIONS FOR HEADACHE  Currently taking:  - Losartan 100 mg for high blood pressure   - Amlodipine 5 mg daily high blood pressure   - HCTZ 2.5 mg daily for high blood pressure      Tried in the past:  - Amlodipine, replaced by verapamil   - Verapamil 40 mg daily - started one week ago in Sept 2022, she was recommended to increase the dose to 80      THERAPIES TRIED  - None      SUPPLEMENTS:  Currently taking:  - Magnesium glycinate 500 mg bid (total of 1000 mg daily) - she feels more muscle crams      Tried in the past:  - Fish oil   - B2  - B12  - D3      HISTORY OF PRESENT ILLNESS  She reports the following features of her headaches:     - FREQUENCY (HA days/month): 0 days in July 2024   - AVERAGE INTENSITY: n/a  - DURATION: n/a     She had 0 migraine days in July 2024.     Robin Hart is here  today for a follow up visit, medications review and reassurance.   She describes her headaches as being seasonal, they come and go, and can be gone for 1-2 months. She has no headache at this time, last time having them in May 2024. She also believes that her headaches are "more dizziness than headaches".     About two weeks ago she went to [place] and had a covid19 infection. She books a trip to [place] starting next week.     She prefers to go for a walk - "the best I can do for a headache"  She is also seeing [facility] in [place] for Headache disorder, raising a  suspicion that her her symptoms (headache and dizziness) could be related to hypertension controlled less than ideal. She is currently taking amlodipine, HCTZ and losartan and her BP is around 120/50. We discussed today about the impact of blood pressure in her symptoms and migraines BP too high but also too low). She checks her BP at home with a portable machine, and I also recommended a pulse oximeter.      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 Acute (abortive) treatment:   - OK to take naproxen occasionally for pain (2-4 days per month)  - OK to take meclizine for dizziness  - OK to take scopolamine patch for motion sickness while in [place] or on a boat. Please take the box with to any pharmacy and ask about clear directions how to use it and how to store it.   - OK to tale Flonase, Zyrtec, Mucinex for post-nasal drip     2. Preventive treatment:  - Hold off for now   - Continue to work with your PCP monitoring and controlling blood pressure Robin Hart is currently taking losartan, amlodipine and HCTZ for blood pressure control).    3. Natural approaches:   - Continue supporting the brain function by taking recommended supplements and vitamins  - Start light therapy using a Happy Lamp (S.A.D. lamp) in the fall and winter season. During the summer, long walks outside around 9:00a.m. are more appropriate   - Continue magnesium 500 mg a.m. + 500 mg p.m. (total of 1000 mg daily)     4. Start using [name] medical device for neuromodulation, if the headaches return.   - Ice cap     5. We recommend exercise, nutrition, and sleep recommendations for lifestyle management of headaches.    6. Consider our shared visits:   Our clinic offers virtual shared visits, providing helpful tools to patients who cope with headaches and pain. All  group visits are telemedicine visits using Zoom app. Please call the Headache Clinic front desk for appointments and a walk though the telemedicine process (phone number [phone], option 2).     Empowered Relief   A Fresh Approach to Managing Your Headache and Pain! Have you been struggling with  ongoing headaches or pain? We're excited to offer you a brand-new way to find relief and take control of your well-being. Join [place] for "Empowered Relief," a special one-time session designed by [employer], and delivered to you by trained experts at the [facility].    What's Empowered Relief all about? It's a program backed by research, made to help you tackle pain head-on. In just one session, you'll discover new ways to understand and manage your pain. You'll learn about:  Pain Education: Understand what causes your pain and how it affects you, so you can feel more in control.  Relaxation Techniques: Find out how to ease tension in your body, which can help lessen pain.  Cognitive Reframing: Learn to shift your thoughts about pain, which can change how you feel about it.  Mindfulness: Gain tools to stay present and reduce stress, helping you manage pain better.    This session is a key part of our clinic's comprehensive approach to pain treatment. We believe in tackling pain from all angles, and Empowered Relief is a powerful tool in our toolbox.     Steps to Success (with Dr. Maxie Better)  Headaches are complex. This class covers general information about this vast territory of how the brain works, why we develop migraines, and options for treatments.     Nutrition Shared Visit (with Dr. Maxie Better)  Nutrition is very important for people with headaches. You may have noticed that there are certain foods that can trigger a headache, but the full story goes deeper than that. This class provides an interactive group format to learn scientifically based information on the relationship between  brain health and diet. We'll go over various nutritional concepts important for managing headache, including anti-inflammatory diets, ketogenic diets, the microbiome, and weight loss. Come with all your questions and an open mind!    Biofeedback (with Dr. Maxie Better)  Stress affects the entire body and can cause physical and psychological problems. Over time, unchecked stress can cause the body to stay in a constant and chronic state of stress promoting migraines, fatigue and illness. Research has demonstrated several types of biofeedback practices can be helpful to reduce stress and regulate heart rate variability. In this class you will discover your stress baseline through using [facility] electronic educational learning devices. After practicing several [name] stress management techniques, you will be able to monitor your progress by checking your stress baseline and heart rhythm patterns with the educational learning device while in class.    Living Well with Stress Series - CBT (with Dr. Maxie Better)  Are you feeling stressed out all the time? Well, you're not alone. Stress is a pervasive problem for most Americans: most visits to healthcare providers in the [place] are related to stress. Stress can profoundly affect overall health, especially for people with a history of migraine or other chronic illness.    Exercise Shared Visit (with Genevieve Norlander, PA-C)  Exercise is an important part of multimodal headache management. Exercise addresses pain through serotonin release, reduces stress, and improves the quality of sleep. It's basically a wonder drug! Migraine, cervicogenic headache, post-concussion recovery, and pregnancy-related headache all benefit from exercise. Many of our patients tell us that it's difficult to start exercising because of their headaches. This session discusses the benefits of exercise, and talks about how to start exercise gently and safely, and the best types and amounts of  exercise. So, let's get started today and feel better every day!  Neuromodulation (with Genevieve Norlander, PA-C)  A multimodal headache management is more efficient than medications alone. Part of this treatment is neuromodulation with medical devices designed to treat headaches. Let's explore this avenue, controlling pain and brain function with this safe non-medication option.    Medication Overuse Headache (with Dr. Berneta Sages)  Many [facility] patients have had disabling headaches for many years. One of the most common diagnoses in our patients with migraine is medication overuse headache. Medication overuse headache can occur with frequent use of medications such as Tylenol, Advil, triptans, and many others. Taking just ten pills per month of these medications can cause medication overuse headache. For patients to get the best results, it is important to address medication overuse headache as soon as possible. This class describes medication overuse headache, why it occurs, and provides alternative strategies to treat acute headache.    Headache SOS/Bridge Therapy Options (with Dr. Berneta Sages)  Some patients experience periods during which their headaches become much worse than usual. Headaches can become so severe during these periods that patients end up visiting Urgent Care or the Emergency Room. Some therapies, called "bridge therapies," can be used for a short time period (one week to about a month), to help patients with worsened headache. These therapies are safer than using medications like Tylenol or Advil, or opioid painkillers. This class reviews a range of "bridge therapy" options, about which patients can speak to headache providers or primary care providers, so that a plan is in place ahead of time.    Newer Medications for Headache (with Dr. Berneta Sages)  Several medications have been developed specifically for migraine and cluster headache since 2018. Some of these are acute treatments, which  are taken occasionally, when you have a severe headache episode. Others are preventive, which are taken regularly to decrease the likelihood of having a headache episode. For example, one type of preventive medication is a monthly injection you give to yourself at home. This session goes over new headache medications and discusses their best uses and side effects.    Lidocaine (with Dr. Berneta Sages)  Treating headaches with medication can be difficult when you experience side effects, drug interactions, or have other medical conditions that limit your options. Procedures using lidocaine can be very effective at treating and preventing headaches and are safe for almost everyone - no matter what side effects you've experienced with other treatments, or what other medications you're taking. They're even a good option for pregnant women. This shared visit reviews three different procedures to consider for your headaches: occipital nerve blocks, sphenopalatine ganglion (SPG) blocks, and at home SPG blocks.    The sleep series (with Mui Chan-Goh, ARNP)  Insomnia is very common in headache patients. Headaches can ruin your sleep, and insomnia can make headaches worse, creating a vicious cycle. The best treatment for chronic insomnia is Cognitive-Behavioral Therapy for Insomnia (CBT-I); it can improve your sleep without any need for sleeping pills. We will discuss the principles of CBT-I. Please upload your sleep diary via [employer] survey prior to class as we will need this information to apply the CBT-I concepts learnt in class.    Botox (with Fay Records, ARNP)  If you are interested in BOTOX therapy for chronic migraine, join [place] to learn how BOTOX works, the benefits and side effects and how we decide if this is a good option for our patients. BOTOX (onabotulinumtoxinA) is a prescription medicine that is injected into muscles and used to prevent  headaches in adults with chronic migraine who have 15 or more days each  month with headache lasting 4 or more hours each day in people 18 years and older. Botox injections are approved for the treatment of chronic migraine since 2010. Please join [place] for this shared visit and we will do our best to answer all questions regarding BOTOX therapy for chronic migraine.    7. Follow up with Genevieve Norlander PA-C in 3 months or sooner if any new problems related to headache management arise.      Benefits, side effects, and alternatives were discussed.   The patient understands all the recommendations.     The patient is agreeable with the plan.     I spent 44 minutes in total with the patient including face to face time doing a Telemedicine visit, reviewing chart notes, counseling regarding the diagnosis and treatment options and documenting the visit.       Iris Pert, PA-C   [facility]

## 2023-01-30 NOTE — Progress Notes (Signed)
 [facility] HEADACHE CLINIC - using Telemedicine    Distant Site Telemedicine Encounter  I conducted this encounter via secure, live, face-to-face video conference with the patient. I reviewed the risks and benefits of telemedicine as pertinent to this visit and the patient agreed to proceed.    Provider Location: Off-site location (home, non- location)  Patient Location: At home    Present with patient: No one else present        Headache Clinic Visits to date  - 01/18/2018 - Initial visit (by Ronita King, MD)  - 12/22/2020 - Follow up visit #1 using Telemedicine (by Artemus JONETTA Clover, PA-C)   - 03/23/2021 - Follow up visit #2 using Telemedicine (by Artemus JONETTA Clover, PA-C)   - 08/26/2022 - Follow up visit using Telemedicine (by Caretha Lear Breeding, ARNP)   - 09/02/2022 - Video Visit with [facility] [place] - for Headache disorder  - 10/31/2022 - Follow up visit using Telemedicine (by Artemus JONETTA Clover, PA-C)   - 01/31/2023 - Follow up visit using Telemedicine (by Artemus JONETTA Clover, PA-C) Today    IDENTIFICATION DATA/CHIEF COMPLAINT:  Robin Hart is a 82 year old female who returns today to discuss her headaches.      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  (primary encounter diagnosis)  Comorbid diagnoses include: IBS, HTN, OSA (cannot tolerate CPAP), pulsatile tinnitus, glaucoma      ========================================    CURRENT MEDICATION LIST PER TODAY 01/31/2023:    ACUTE MEDICATIONS  Currently taking:  - Aleve - 2-4 days in July 2024   - Meclizine  - 2 days last time in Aug 2024 while in [place]  - Scopolamine for motion sickness - 2 days last time in Aug 2024 while in [place]     Tried in the past:  - Flonase , Zyrtec, Mucinex daily for post-nasal drip after URI from her grandchildren   - Excedrin Migraine - 0 days in July 2024   - Tylenol  - 1 day in July 2024 when she had a covid19 infection in [place]       Neuromodulation  - [name] - she  purchased it but did not take it out of the box   - Happy Lamp - she purchased it but did not take it out of the box   - Ice cap      PREVENTIVE MEDICATIONS FOR HEADACHE  Currently taking:  - Verapamil 40 mg x2 times a day (total of 80 mg daily) - started in Oct 2024 (17 days ago)  - Losartan  100 mg for high blood pressure   - Amlodipine  5 mg daily high blood pressure   - HCTZ 2.5 mg daily for high blood pressure      Tried in the past:  - Amlodipine , replaced by verapamil      THERAPIES TRIED  - None      SUPPLEMENTS:  Currently taking:  - Magnesium glycinate 500 mg bid (total of 1000 mg daily) - she feels more muscle crams   - B12  - D3  - B2      Memantine    HISTORY OF PRESENT ILLNESS  She reports the following features of her headaches:      - FREQUENCY (HA days/month): 15+ days in Oct 2024; 0 days in July 2024   - AVERAGE INTENSITY: n/a  - DURATION: n/a     She had 0 migraine days in July 2024.      Robin Hart is here today  for a follow up visit, medications review and reassurance.   She stated that she has 3 different symptoms: headache, dizziness and associated symptoms. She can be symptom free for one hour, but that she may get dizzy for the next hour.   She started verapamil and maybe the things get better.  She feels like taking too many meds, 14 different kind a day now.   She continues to take supplements and we discussed about them - she would like to reduce the number of pills taking every day and the supplements could also be expensive.   She tried [name] but it was painful. We reviewed the tutorial and how she can manage the intensity.  We also discussed about memantine and she would like to read more about this before making any decision.    She agreed to restart the light therapy with her SAD lamp.    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 Acute (abortive) treatment:   - OK to take as needed naproxen, meclizine  for dizziness, Flonase , Zyrtec and Mucinex for post-nasal drip      2. Preventive treatment:  - Continue to work with your PCP for blood pressure control with losartan , amlodipine  and HCTZ     - Could consider memantine as discussed:    Namenda (memantine) is a preventive medication for migraine prevention and post traumatic headaches.  Common side effects includes dizziness and confusion - take caution standing up or changing position quickly.     We recommend using namenda (memantine) as preventive therapy for at least 1 year if you have no significant side effects.   We will prescribe the medication until you are on a stable dose, and than the expectation is for you to have the prescription managed by your primary care provider.    Memantine is the first in a novel class of Alzheimer's disease medications acting on the glutamatergic system. Memantine is a moderate?affinity voltage?dependent noncompetitive antagonist at glutamatergic N?methyl?D?aspartate (NMDA) receptors. By binding to the NMDA receptor with a higher affinity than magnesium (Mg2+) ions, Memantine is able to inhibit the prolonged influx of calcium (Ca2+) ions associated with neuronal excitotoxicity.  Because glutamate is probably important in migraine pathophysiology, and individuals with refractory migraine frequently complain of cognitive problems, either as a function of the disease itself or as a side effect of medications, and for this  reason, several groups have now studied and found positive effect from memantine therapy for individuals with migraine.     Noruzzadeh, Rezvan, et al. Memantine for Prophylactic Treatment of Migraine Without Aura: A Randomized Double?Blind Placebo?Controlled Study. Headache: The Journal of Head and Face Pain 56.1 (2016): 95-103.     Posttraumatic Headache: Basic Mechanismsand Therapeutic Annita Rowan, MD; Prentice Dunnings, MD  [email or web address]/full/10.1111/[email or web address]       3. Natural approaches:   - Continue supporting the brain function by taking recommended supplements and vitamins: magnesium and vitamin  D3. Could pause B2 and B12.     4. Reconsider neuromodulation using [name] medical device. Could stop the intensity to a lower level.    5. We recommend exercise, nutrition, and sleep recommendations for lifestyle management of headaches.    6. EDUCATION:   For more education, we recommend attending our Shared Medical Visits.   - Empowered Relief (new in 2024, multiple providers)  - Steps to Success (Wilbert Hanlon, MD)  - Biofeedback Stuart Hanlon, MD)  - Cognitive behavior therapy for pain Stuart Hanlon, MD)  - Nutrition Stuart Hanlon, MD)   - Good Night Sleep (Mui Chan-Goh, DNP)  - Better Night Sleep (Mui Chan-Goh, DNP)  - Exercise and headache Donalynn Clover, PA-C)  - Neuromodulation with medical devices (Shaquela Weichert Walcott, PA-C)  - Medication Overuse Headache Stella King, MD)  - Headache SOS/Bridge Therapy Options Stella King, MD)  - Newer Medications for Headache Stuart Hanlon, MD / Ronita King, MD)  - Lidocaine  in Headache (Natalia Murinova, MD / Ronita King, MD)  - Botox  for chronic migraine (Kelsi Tsukayama, ARNP)    7. Follow up with Artemus Clover PA-C in 6 months or sooner if any new problems related to headache management arise.      Benefits, side effects, and alternatives were discussed.   The patient understands all the recommendations.     The  patient is agreeable with the plan.     I spent 38 minutes in total with the patient including face to face time doing a Telemedicine visit, reviewing chart notes, counseling regarding the diagnosis and treatment options and documenting the visit.       Artemus JONETTA Clover, PA-C   [facility]

## 2023-01-31 ENCOUNTER — Ambulatory Visit: Payer: Medicare PPO | Admitting: Physician Assistant

## 2023-01-31 ENCOUNTER — Encounter (HOSPITAL_BASED_OUTPATIENT_CLINIC_OR_DEPARTMENT_OTHER): Payer: Self-pay | Admitting: Physician Assistant

## 2023-01-31 VITALS — Ht 61.0 in | Wt 119.0 lb

## 2023-01-31 DIAGNOSIS — G43711 Chronic migraine without aura, intractable, with status migrainosus: Secondary | ICD-10-CM

## 2023-01-31 NOTE — Patient Instructions (Signed)
 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 Acute (abortive) treatment:   - OK to take as needed naproxen, meclizine for dizziness, Flonase, Zyrtec and Mucinex for post-nasal drip      2. Preventive treatment:  - Continue to work with your PCP for blood pressure control with losartan, amlodipine and HCTZ     - Could consider memantine as discussed:    Namenda (memantine) is a preventive medication for migraine prevention and post traumatic headaches.  Common side effects includes dizziness and confusion - take caution standing up or changing position quickly.     We recommend using namenda (memantine) as preventive therapy for at least 1 year if you have no significant side effects.   We will prescribe the medication until you are on a stable dose, and than the expectation is for you to have the prescription managed by your primary care provider.    Memantine is the first in a novel class of Alzheimer's disease medications acting on the glutamatergic system. Memantine is a moderate?affinity voltage?dependent noncompetitive antagonist at glutamatergic N?methyl?D?aspartate (NMDA) receptors. By binding to the NMDA receptor with a higher affinity than magnesium (Mg2+) ions, Memantine is able to inhibit the prolonged influx of calcium (Ca2+) ions associated with neuronal excitotoxicity.  Because glutamate is probably important in migraine pathophysiology, and individuals with refractory migraine frequently complain of cognitive problems, either as a function of the disease itself or as a side effect of medications, and for this reason, several groups have now studied and found positive effect from memantine therapy for individuals with migraine.     Evette Doffing, et al. "Memantine for Prophylactic Treatment of Migraine  Without Aura: A Randomized Double?Blind Placebo?Controlled Study." Headache: The Journal of Head and Face Pain 56.1 (2016): 95-103.     Posttraumatic Headache: Basic Mechanismsand Therapeutic Mammie Lorenzo, MD; Aileen Pilot, MD  [email or web address]://[email or web address].wiley.com/doi/full/10.1111/head.13312       3. Natural approaches:   - Continue supporting the brain function by taking recommended supplements and vitamins: magnesium and vitamin D3. Could pause B2 and B12.     4. Reconsider neuromodulation using [name] medical device. Could stop the intensity to a lower level.    5. We recommend exercise, nutrition, and sleep recommendations for lifestyle management of headaches.    6. EDUCATION:   For more education, we recommend attending our Shared Medical Visits.   - Empowered Relief (new in 2024, multiple providers)  - Steps to Success (Maxie Better, MD)  - Biofeedback Maxie Better, MD)  - Cognitive behavior therapy for pain Maxie Better, MD)  - Nutrition Maxie Better, MD)   - Good Night Sleep (Mui Chan-Goh, DNP)  - Better Night Sleep (Mui Chan-Goh, DNP)  - Exercise and headache Genevieve Norlander, PA-C)  - Neuromodulation with medical devices (Armel Rabbani [place], PA-C)  - Medication Overuse Headache Berneta Sages, MD)  - Headache SOS/Bridge Therapy Options Berneta Sages, MD)  - Newer Medications for Headache Maxie Better, MD / Berneta Sages, MD)  - Lidocaine in Headache Maxie Better, MD / Berneta Sages, MD)  - Botox for chronic migraine (Kelsi Tsukayama, ARNP)    7. Follow up with Genevieve Norlander PA-C in 6 months or sooner if any new problems related to headache management arise.      Benefits, side effects, and alternatives were discussed.   The  patient understands all the recommendations.     The patient is agreeable with the plan.

## 2023-04-04 ENCOUNTER — Encounter (INDEPENDENT_AMBULATORY_CARE_PROVIDER_SITE_OTHER): Payer: Medicare PPO | Admitting: Family Practice

## 2023-04-17 ENCOUNTER — Ambulatory Visit: Payer: Medicare PPO

## 2023-04-17 ENCOUNTER — Other Ambulatory Visit: Payer: Self-pay

## 2023-04-20 ENCOUNTER — Other Ambulatory Visit (HOSPITAL_BASED_OUTPATIENT_CLINIC_OR_DEPARTMENT_OTHER): Payer: Self-pay | Admitting: Nurse Practitioner

## 2023-04-20 DIAGNOSIS — R928 Other abnormal and inconclusive findings on diagnostic imaging of breast: Secondary | ICD-10-CM

## 2023-04-21 ENCOUNTER — Ambulatory Visit: Payer: Medicare PPO

## 2023-04-21 ENCOUNTER — Other Ambulatory Visit (HOSPITAL_BASED_OUTPATIENT_CLINIC_OR_DEPARTMENT_OTHER): Payer: Self-pay

## 2023-04-21 DIAGNOSIS — Z7689 Persons encountering health services in other specified circumstances: Secondary | ICD-10-CM

## 2023-04-28 ENCOUNTER — Ambulatory Visit (HOSPITAL_COMMUNITY): Payer: Medicare PPO

## 2023-04-28 ENCOUNTER — Other Ambulatory Visit (HOSPITAL_BASED_OUTPATIENT_CLINIC_OR_DEPARTMENT_OTHER): Payer: Self-pay | Admitting: Nurse Practitioner

## 2023-04-28 ENCOUNTER — Other Ambulatory Visit (HOSPITAL_COMMUNITY): Payer: Self-pay

## 2023-04-28 DIAGNOSIS — R928 Other abnormal and inconclusive findings on diagnostic imaging of breast: Secondary | ICD-10-CM

## 2023-05-23 ENCOUNTER — Other Ambulatory Visit: Payer: Self-pay

## 2023-05-23 ENCOUNTER — Ambulatory Visit: Payer: Medicare PPO

## 2023-05-23 ENCOUNTER — Ambulatory Visit (HOSPITAL_COMMUNITY): Payer: Medicare PPO

## 2023-05-25 ENCOUNTER — Ambulatory Visit: Payer: Medicare PPO

## 2023-05-25 ENCOUNTER — Other Ambulatory Visit (HOSPITAL_BASED_OUTPATIENT_CLINIC_OR_DEPARTMENT_OTHER): Payer: Self-pay | Admitting: Family Medicine

## 2023-05-25 ENCOUNTER — Other Ambulatory Visit (HOSPITAL_BASED_OUTPATIENT_CLINIC_OR_DEPARTMENT_OTHER): Payer: Self-pay

## 2023-05-25 DIAGNOSIS — R928 Other abnormal and inconclusive findings on diagnostic imaging of breast: Secondary | ICD-10-CM

## 2023-05-25 DIAGNOSIS — Z7689 Persons encountering health services in other specified circumstances: Secondary | ICD-10-CM

## 2023-05-26 ENCOUNTER — Ambulatory Visit (HOSPITAL_BASED_OUTPATIENT_CLINIC_OR_DEPARTMENT_OTHER): Payer: Medicare PPO

## 2023-05-26 ENCOUNTER — Other Ambulatory Visit (HOSPITAL_BASED_OUTPATIENT_CLINIC_OR_DEPARTMENT_OTHER): Payer: Self-pay | Admitting: Family Medicine

## 2023-05-26 ENCOUNTER — Other Ambulatory Visit (HOSPITAL_COMMUNITY): Payer: Self-pay

## 2023-05-26 DIAGNOSIS — R928 Other abnormal and inconclusive findings on diagnostic imaging of breast: Secondary | ICD-10-CM

## 2023-05-28 DIAGNOSIS — R928 Other abnormal and inconclusive findings on diagnostic imaging of breast: Secondary | ICD-10-CM

## 2023-05-30 ENCOUNTER — Other Ambulatory Visit (HOSPITAL_BASED_OUTPATIENT_CLINIC_OR_DEPARTMENT_OTHER): Payer: Self-pay | Admitting: Family Medicine

## 2023-05-30 DIAGNOSIS — R928 Other abnormal and inconclusive findings on diagnostic imaging of breast: Secondary | ICD-10-CM

## 2023-06-08 ENCOUNTER — Ambulatory Visit
Admission: RE | Admit: 2023-06-08 | Discharge: 2023-06-08 | Disposition: A | Discharge status: Home / Self Care | Payer: Medicare PPO | Source: Home / Self Care | Attending: Body Imaging | Admitting: Body Imaging

## 2023-06-08 ENCOUNTER — Ambulatory Visit (HOSPITAL_BASED_OUTPATIENT_CLINIC_OR_DEPARTMENT_OTHER): Admitting: Family Medicine

## 2023-06-08 ENCOUNTER — Other Ambulatory Visit (HOSPITAL_BASED_OUTPATIENT_CLINIC_OR_DEPARTMENT_OTHER): Payer: Self-pay | Admitting: Family Medicine

## 2023-06-08 ENCOUNTER — Ambulatory Visit (HOSPITAL_BASED_OUTPATIENT_CLINIC_OR_DEPARTMENT_OTHER)
Admit: 2023-06-08 | Discharge: 2023-06-08 | Disposition: A | Discharge status: Home / Self Care | Source: Home / Self Care

## 2023-06-08 DIAGNOSIS — R920 Mammographic microcalcification found on diagnostic imaging of breast: Secondary | ICD-10-CM | POA: Insufficient documentation

## 2023-06-08 DIAGNOSIS — R928 Other abnormal and inconclusive findings on diagnostic imaging of breast: Secondary | ICD-10-CM

## 2023-06-08 DIAGNOSIS — N6082 Other benign mammary dysplasias of left breast: Secondary | ICD-10-CM | POA: Insufficient documentation

## 2023-06-08 DIAGNOSIS — N6322 Unspecified lump in the left breast, upper inner quadrant: Secondary | ICD-10-CM

## 2023-06-08 MED ORDER — LIDOCAINE HCL 1 % IJ SOLN
5.0000 mL | Freq: Once | INTRAMUSCULAR | Status: AC
Start: 2023-06-08 — End: 2023-06-08
  Administered 2023-06-08: 3.5 mL via INTRADERMAL

## 2023-06-12 LAB — PATHOLOGY, SURGICAL

## 2023-08-29 ENCOUNTER — Other Ambulatory Visit (HOSPITAL_BASED_OUTPATIENT_CLINIC_OR_DEPARTMENT_OTHER): Payer: Self-pay

## 2023-08-29 DIAGNOSIS — G43711 Chronic migraine without aura, intractable, with status migrainosus: Secondary | ICD-10-CM

## 2023-08-29 DIAGNOSIS — R42 Dizziness and giddiness: Secondary | ICD-10-CM

## 2023-08-31 MED ORDER — MECLIZINE HCL 12.5 MG OR TABS
ORAL_TABLET | ORAL | 11 refills | Status: AC
Start: 2023-08-31 — End: ?

## 2023-08-31 NOTE — Telephone Encounter (Signed)
This medication is not on refill center's protocols: Meclizine. This request requires provider's approval.

## 2023-09-01 NOTE — Telephone Encounter (Addendum)
 Called the patient per Dr. Babs Less message below. Per the patient - she solemnly use meclizine  and she is sorry that the refill request came to the headache clinic because her neurologist in [place] is the one she had detailed conversation with on this medication and would like that other provider to manage it and not the Headache providers. She also said she will be asking [facility] to send future refill requests to the other provider's clinic.     She was notified that the Rx was already send in and she is will be following up with her other doctor on further refills.     Offered to schedule a visit- the patient would like to wait on scheduling a follow up.     "how often she is using meclizine . We don't typically use meclizine  as a long-term medication, and it's not a headache treatment. I have refilled the prescription for now, but I would recommend she have a follow-up visit with myself and Nilda Barthel on a Friday morning so that we can review her dizziness in more detail."

## 2024-02-28 ENCOUNTER — Telehealth (HOSPITAL_BASED_OUTPATIENT_CLINIC_OR_DEPARTMENT_OTHER): Payer: Self-pay

## 2024-02-28 NOTE — Telephone Encounter (Signed)
 RETURN CALL: Voicemail - Detailed Message      SUBJECT:  Appointment Request     REASON FOR VISIT: Urinary incontinence former patient of Dr Slater Pinal  PREFERRED DATE/TIME: TBD  REASON UNABLE TO APPOINT: No appointments available. CCR attempted warm transfer but clinic busy helping other patients. Please call patient.

## 2024-03-04 NOTE — Telephone Encounter (Signed)
 Patient calling again to schedule, patient states she prefers this location to see Dr. Cleotilde again, [facility] unable to reach clinic or appoint per [facility] is not on DT regarding reason for visit. Patient states aside from being  seen for urinary incontinence, she also has a medtronic stimulator as well that she wanted to discuss with Dr. Cleotilde.

## 2024-03-12 NOTE — Telephone Encounter (Signed)
 Patient scheduled with Dr Cleotilde on 07/17/2024

## 2024-03-19 ENCOUNTER — Encounter (HOSPITAL_BASED_OUTPATIENT_CLINIC_OR_DEPARTMENT_OTHER): Payer: Self-pay | Admitting: Surgery

## 2024-04-02 ENCOUNTER — Telehealth (HOSPITAL_BASED_OUTPATIENT_CLINIC_OR_DEPARTMENT_OTHER): Payer: Self-pay

## 2024-04-02 NOTE — Telephone Encounter (Signed)
 RETURN CALL: Voicemail - Detailed Message      SUBJECT:  Referral Request/Questions - Incoming     REASON FOR REFERRAL: Diverticulitis, Crohn's, SIBO  DATE SENT: 12/30  REFERRING CLINIC/PROVIDER: Natalie Ancona  QUESTIONS: Patient calling to check on receipt of referral, states understanding that [employer] in Out of Network.

## 2024-04-03 ENCOUNTER — Telehealth (HOSPITAL_BASED_OUTPATIENT_CLINIC_OR_DEPARTMENT_OTHER): Payer: Self-pay

## 2024-04-03 NOTE — Telephone Encounter (Signed)
 Returned pt's call inquiring about a referral from [facility]. Went to VM and left message that the referral has not been received at [facility] and left direct fax number [phone].  04/03/24

## 2024-04-10 ENCOUNTER — Encounter (HOSPITAL_BASED_OUTPATIENT_CLINIC_OR_DEPARTMENT_OTHER): Payer: Self-pay

## 2024-04-10 DIAGNOSIS — K5732 Diverticulitis of large intestine without perforation or abscess without bleeding: Secondary | ICD-10-CM

## 2024-07-17 ENCOUNTER — Ambulatory Visit: Admitting: Urology
